# Patient Record
Sex: Male | Born: 1980
Health system: Southern US, Community
[De-identification: ages and names within clinical notes are randomized; demographics above are authoritative.]

## PROBLEM LIST (undated history)

## (undated) DIAGNOSIS — D869 Sarcoidosis, unspecified: Secondary | ICD-10-CM

## (undated) DIAGNOSIS — J45909 Unspecified asthma, uncomplicated: Secondary | ICD-10-CM

## (undated) HISTORY — PX: PATELLA RECONSTRUCTION: SHX736

## (undated) HISTORY — PX: FEMUR FRACTURE SURGERY: SHX633

---

## 2004-12-22 ENCOUNTER — Emergency Department (HOSPITAL_COMMUNITY): Admission: EM | Admit: 2004-12-22 | Discharge: 2004-12-22 | Payer: Self-pay | Admitting: Emergency Medicine

## 2005-05-23 ENCOUNTER — Emergency Department (HOSPITAL_COMMUNITY): Admission: EM | Admit: 2005-05-23 | Discharge: 2005-05-23 | Payer: Self-pay | Admitting: Emergency Medicine

## 2005-08-15 ENCOUNTER — Emergency Department (HOSPITAL_COMMUNITY): Admission: EM | Admit: 2005-08-15 | Discharge: 2005-08-15 | Payer: Self-pay | Admitting: Emergency Medicine

## 2005-11-05 ENCOUNTER — Emergency Department (HOSPITAL_COMMUNITY): Admission: EM | Admit: 2005-11-05 | Discharge: 2005-11-05 | Payer: Self-pay | Admitting: Emergency Medicine

## 2007-02-25 ENCOUNTER — Emergency Department (HOSPITAL_COMMUNITY): Admission: EM | Admit: 2007-02-25 | Discharge: 2007-02-25 | Payer: Self-pay | Admitting: Emergency Medicine

## 2007-03-18 ENCOUNTER — Emergency Department (HOSPITAL_COMMUNITY): Admission: EM | Admit: 2007-03-18 | Discharge: 2007-03-18 | Payer: Self-pay | Admitting: Family Medicine

## 2007-04-03 ENCOUNTER — Emergency Department (HOSPITAL_COMMUNITY): Admission: EM | Admit: 2007-04-03 | Discharge: 2007-04-03 | Payer: Self-pay | Admitting: Family Medicine

## 2007-05-21 ENCOUNTER — Emergency Department (HOSPITAL_COMMUNITY): Admission: EM | Admit: 2007-05-21 | Discharge: 2007-05-22 | Payer: Self-pay | Admitting: Emergency Medicine

## 2007-06-29 ENCOUNTER — Ambulatory Visit (HOSPITAL_COMMUNITY): Admission: EM | Admit: 2007-06-29 | Discharge: 2007-06-29 | Payer: Self-pay | Admitting: Family Medicine

## 2007-07-17 ENCOUNTER — Encounter: Admission: RE | Admit: 2007-07-17 | Discharge: 2007-07-30 | Payer: Self-pay | Admitting: General Surgery

## 2007-07-31 ENCOUNTER — Encounter: Admission: RE | Admit: 2007-07-31 | Discharge: 2007-08-26 | Payer: Self-pay | Admitting: General Surgery

## 2007-08-04 ENCOUNTER — Emergency Department (HOSPITAL_COMMUNITY): Admission: EM | Admit: 2007-08-04 | Discharge: 2007-08-04 | Payer: Self-pay | Admitting: Family Medicine

## 2008-01-03 ENCOUNTER — Emergency Department (HOSPITAL_COMMUNITY): Admission: EM | Admit: 2008-01-03 | Discharge: 2008-01-03 | Payer: Self-pay | Admitting: Family Medicine

## 2008-12-03 ENCOUNTER — Emergency Department (HOSPITAL_COMMUNITY): Admission: EM | Admit: 2008-12-03 | Discharge: 2008-12-03 | Payer: Self-pay | Admitting: Emergency Medicine

## 2008-12-04 ENCOUNTER — Emergency Department (HOSPITAL_COMMUNITY): Admission: EM | Admit: 2008-12-04 | Discharge: 2008-12-04 | Payer: Self-pay | Admitting: Emergency Medicine

## 2008-12-21 ENCOUNTER — Encounter: Payer: Self-pay | Admitting: Pulmonary Disease

## 2008-12-22 ENCOUNTER — Inpatient Hospital Stay (HOSPITAL_COMMUNITY): Admission: AC | Admit: 2008-12-22 | Discharge: 2008-12-29 | Payer: Self-pay

## 2008-12-29 ENCOUNTER — Encounter (INDEPENDENT_AMBULATORY_CARE_PROVIDER_SITE_OTHER): Payer: Self-pay | Admitting: General Surgery

## 2008-12-29 ENCOUNTER — Ambulatory Visit: Payer: Self-pay | Admitting: Vascular Surgery

## 2009-01-06 ENCOUNTER — Encounter (INDEPENDENT_AMBULATORY_CARE_PROVIDER_SITE_OTHER): Payer: Self-pay | Admitting: Orthopedic Surgery

## 2009-01-06 ENCOUNTER — Ambulatory Visit: Admission: RE | Admit: 2009-01-06 | Discharge: 2009-01-06 | Payer: Self-pay | Admitting: Orthopedic Surgery

## 2009-01-06 ENCOUNTER — Ambulatory Visit: Payer: Self-pay | Admitting: Vascular Surgery

## 2009-01-07 ENCOUNTER — Ambulatory Visit: Payer: Self-pay | Admitting: Pulmonary Disease

## 2009-01-07 DIAGNOSIS — R930 Abnormal findings on diagnostic imaging of skull and head, not elsewhere classified: Secondary | ICD-10-CM | POA: Insufficient documentation

## 2009-01-19 ENCOUNTER — Encounter: Admission: RE | Admit: 2009-01-19 | Discharge: 2009-04-16 | Payer: Self-pay | Admitting: Orthopedic Surgery

## 2009-01-22 ENCOUNTER — Ambulatory Visit (HOSPITAL_COMMUNITY): Admission: RE | Admit: 2009-01-22 | Discharge: 2009-01-22 | Payer: Self-pay | Admitting: Orthopedic Surgery

## 2009-01-22 ENCOUNTER — Ambulatory Visit (HOSPITAL_COMMUNITY): Admission: RE | Admit: 2009-01-22 | Discharge: 2009-01-23 | Payer: Self-pay | Admitting: Orthopedic Surgery

## 2009-01-30 ENCOUNTER — Emergency Department (HOSPITAL_COMMUNITY): Admission: EM | Admit: 2009-01-30 | Discharge: 2009-01-30 | Payer: Self-pay | Admitting: Emergency Medicine

## 2009-02-17 ENCOUNTER — Ambulatory Visit: Payer: Self-pay | Admitting: Pulmonary Disease

## 2009-02-24 ENCOUNTER — Telehealth (INDEPENDENT_AMBULATORY_CARE_PROVIDER_SITE_OTHER): Payer: Self-pay | Admitting: *Deleted

## 2009-03-23 ENCOUNTER — Ambulatory Visit: Payer: Self-pay | Admitting: Pulmonary Disease

## 2009-03-23 DIAGNOSIS — J45909 Unspecified asthma, uncomplicated: Secondary | ICD-10-CM | POA: Insufficient documentation

## 2009-03-25 ENCOUNTER — Telehealth: Payer: Self-pay | Admitting: Pulmonary Disease

## 2009-03-31 ENCOUNTER — Ambulatory Visit: Payer: Self-pay | Admitting: Pulmonary Disease

## 2009-03-31 ENCOUNTER — Ambulatory Visit: Admission: RE | Admit: 2009-03-31 | Discharge: 2009-03-31 | Payer: Self-pay | Admitting: Pulmonary Disease

## 2009-04-05 ENCOUNTER — Telehealth (INDEPENDENT_AMBULATORY_CARE_PROVIDER_SITE_OTHER): Payer: Self-pay | Admitting: *Deleted

## 2009-04-06 ENCOUNTER — Ambulatory Visit: Payer: Self-pay | Admitting: Pulmonary Disease

## 2009-04-06 DIAGNOSIS — D869 Sarcoidosis, unspecified: Secondary | ICD-10-CM

## 2009-04-15 ENCOUNTER — Encounter: Payer: Self-pay | Admitting: Pulmonary Disease

## 2009-05-12 ENCOUNTER — Encounter: Admission: RE | Admit: 2009-05-12 | Discharge: 2009-08-03 | Payer: Self-pay | Admitting: Orthopedic Surgery

## 2009-07-11 ENCOUNTER — Inpatient Hospital Stay (HOSPITAL_COMMUNITY): Admission: EM | Admit: 2009-07-11 | Discharge: 2009-07-13 | Payer: Self-pay | Admitting: Emergency Medicine

## 2009-07-12 ENCOUNTER — Encounter: Payer: Self-pay | Admitting: Physician Assistant

## 2009-08-02 ENCOUNTER — Ambulatory Visit: Payer: Self-pay | Admitting: Physician Assistant

## 2009-08-02 DIAGNOSIS — F17200 Nicotine dependence, unspecified, uncomplicated: Secondary | ICD-10-CM | POA: Insufficient documentation

## 2009-08-03 DIAGNOSIS — F1410 Cocaine abuse, uncomplicated: Secondary | ICD-10-CM | POA: Insufficient documentation

## 2009-08-03 LAB — CONVERTED CEMR LAB
Benzodiazepines.: NEGATIVE
Marijuana Metabolite: NEGATIVE
Methadone: NEGATIVE
Phencyclidine (PCP): NEGATIVE
Propoxyphene: NEGATIVE

## 2009-08-09 ENCOUNTER — Ambulatory Visit (HOSPITAL_COMMUNITY): Admission: RE | Admit: 2009-08-09 | Discharge: 2009-08-09 | Payer: Self-pay | Admitting: Physician Assistant

## 2009-08-09 ENCOUNTER — Ambulatory Visit: Payer: Self-pay | Admitting: Physician Assistant

## 2009-08-09 DIAGNOSIS — S82899A Other fracture of unspecified lower leg, initial encounter for closed fracture: Secondary | ICD-10-CM | POA: Insufficient documentation

## 2009-08-09 DIAGNOSIS — S93409A Sprain of unspecified ligament of unspecified ankle, initial encounter: Secondary | ICD-10-CM | POA: Insufficient documentation

## 2009-10-05 ENCOUNTER — Telehealth: Payer: Self-pay | Admitting: Pulmonary Disease

## 2010-03-23 ENCOUNTER — Telehealth (INDEPENDENT_AMBULATORY_CARE_PROVIDER_SITE_OTHER): Payer: Self-pay | Admitting: *Deleted

## 2010-05-17 NOTE — Letter (Signed)
Summary: ORTHO NOTES/ANKLE FX  ORTHO NOTES/ANKLE FX   Imported By: Arta Bruce 08/30/2009 09:53:40  _____________________________________________________________________  External Attachment:    Type:   Image     Comment:   External Document

## 2010-05-17 NOTE — Assessment & Plan Note (Signed)
Summary: 1 WEEK FU TO CHECK BREATHING//KT   Vital Signs:  Patient profile:   30 year old male O2 Sat:      99 % Temp:     98 degrees F Pulse rate:   108 / minute Pulse rhythm:   regular Resp:     20 per minute BP sitting:   132 / 91  (left arm) Cuff size:   regular  Vitals Entered By: Chauncy Passy, SMA CC: Pt. is here for a 1 week f/u on his breathing. -- But pt,comes in w/a right foot injury that happened yesterday while falling down the stairs. Ankle is swollen, painful and tender.  Is Patient Diabetic? No Pain Assessment Patient in pain? yes     Location: right ankle Intensity: 10 Type: Throbbing Onset of pain  Constant  Does patient need assistance? Functional Status Self care Ambulation Impaired:Risk for fall Comments Peak Flow: 390 - 440 - 430   Primary Care Provider:  Tereso Newcomer, PA-C  CC:  Pt. is here for a 1 week f/u on his breathing. -- But pt, comes in w/a right foot injury that happened yesterday while falling down the stairs. Ankle is swollen, and painful and tender. Marland Kitchen  History of Present Illness: Here for f/u on breathing. Never got his prednisone filled.  Now, out of Symbicort. Breathing unchanged. . . obviously.  However, Felice Deem reports falling down > 6 steps last night.  Has old left left injury that caused him to fall.  Feels like his foot dorsiflexed when he was falling.  Cannot bear weight.  Comes in on crutches.  Used ice and elevation last night.  Swelling only got worse.  In a significant amount of pain.  Having a hard time moving his toes.    Current Medications (verified): 1)  Oxycodone Hcl 5 Mg Tabs (Oxycodone Hcl) .... Take 1 To 4 Tabs By Mouth Every 4 Hours As Needed For Pain 2)  Symbicort 160-4.5 Mcg/act  Aero (Budesonide-Formoterol Fumarate) .... Two Puffs Twice Daily 3)  Proair Hfa 108 (90 Base) Mcg/act  Aers (Albuterol Sulfate) .... 2 Puffs Every 4-6 Hours As Needed 4)  Prednisone 10 Mg Tabs (Prednisone) .... Take 6 Tabs  Today, 5 Tab On Tues, 4 Tabs On Wed, 3 Tabs On Thurs, 2 Tabs On Fri, 1 Tab On Sat and Sun and Then Stop 5)  Chantix Starting Month Pak 0.5 Mg X 11 & 1 Mg X 42 Tabs (Varenicline Tartrate) .... Take As Directed 6)  Chantix Continuing Month Pak 1 Mg Tabs (Varenicline Tartrate) .... Take As Directed  Allergies (verified): 1)  ! * Ivp Dye 2)  ! * Shellfish  Physical Exam  General:  alert, well-developed, and well-nourished.   Head:  normocephalic and atraumatic.   Neck:  supple.   Lungs:  decreased breath sounds bilat ins and exp wheezing no rales  Heart:  normal rate and regular rhythm.   Msk:  mod to severe swelling right ankle very tender to palp (patient shouting) with light touch over medial and lateral malleolus and base of 5th metatarsal distal forefoot also tender no obvious deformity but swelling obscures exam Neurologic:  alert & oriented X3 and cranial nerves II-XII intact.     Impression & Recommendations:  Problem # 1:  INTRINSIC ASTHMA, UNSPECIFIED (ICD-493.10) refill meds patient advised to get his meds from [place] pharmacy  His updated medication list for this problem includes:    Symbicort 160-4.5 Mcg/act Aero (Budesonide-formoterol fumarate) .Marland Kitchen..Marland Kitchen Two puffs  twice daily    Proair Hfa 108 (90 Base) Mcg/act Aers (Albuterol sulfate) .Marland Kitchen... 2 puffs every 4-6 hours as needed    Prednisone 10 Mg Tabs (Prednisone) .Marland Kitchen... Take 6 tabs today, 5 tab on tues, 4 tabs on wed, 3 tabs on thurs, 2 tabs on fri, 1 tab on sat and sun and then stop  Problem # 2:  ANKLE SPRAIN, RIGHT (ICD-845.00)  could have fracture send for xrays right now d/w Dr. Reche Dixon . . . compartment syndrome unlikely with foot injury may have some cutaneous nerves irritated if any fx., will contact his orthopedist f/u with me in 48-72 hours no weight bearing on foot until then  The following medications were removed from the medication list:    Oxycodone Hcl 5 Mg Tabs (Oxycodone hcl) .Marland Kitchen... Take 1 to 4 tabs by  mouth every 4 hours as needed for pain His updated medication list for this problem includes:    Vicodin 5-500 Mg Tabs (Hydrocodone-acetaminophen) .Marland Kitchen... 1-2 every 6 hours as needed for pain  Orders: Diagnostic X-Ray/Fluoroscopy (Diagnostic X-Ray/Flu)  Complete Medication List: 1)  Symbicort 160-4.5 Mcg/act Aero (Budesonide-formoterol fumarate) .... Two puffs twice daily 2)  Proair Hfa 108 (90 Base) Mcg/act Aers (Albuterol sulfate) .... 2 puffs every 4-6 hours as needed 3)  Prednisone 10 Mg Tabs (Prednisone) .... Take 6 tabs today, 5 tab on tues, 4 tabs on wed, 3 tabs on thurs, 2 tabs on fri, 1 tab on sat and sun and then stop 4)  Chantix Starting Month Pak 0.5 Mg X 11 & 1 Mg X 42 Tabs (Varenicline tartrate) .... Take as directed 5)  Chantix Continuing Month Pak 1 Mg Tabs (Varenicline tartrate) .... Take as directed 6)  Vicodin 5-500 Mg Tabs (Hydrocodone-acetaminophen) .Marland Kitchen.. 1-2 every 6 hours as needed for pain  Patient Instructions: 1)  Follow up with Shresta Risden in 2-3 days. 2)  No weight bearing on right foot until next appointment. 3)  Apply ice to ankle two times a day to three times a day. 4)  Use ace wrap to comfort. 5)  Go to the emergency room if pain worsens, toes turn blue or white. 6)  Get your medicines filled at the [facility] pharmacy. Prescriptions: VICODIN 5-500 MG TABS (HYDROCODONE-ACETAMINOPHEN) 1-2 every 6 hours as needed for pain  #40 x 0   Entered and Authorized by:   Tereso Newcomer PA-C   Signed by:   Tereso Newcomer PA-C on 08/09/2009   Method used:   Print then Give to Patient   RxID:   [id number] PREDNISONE 10 MG TABS (PREDNISONE) Take 6 tabs today, 5 tab on Tues, 4 tabs on Wed, 3 tabs on Thurs, 2 tabs on Fri, 1 tab on Sat and Sun and then stop  #22 x 0   Entered and Authorized by:   Tereso Newcomer PA-C   Signed by:   Tereso Newcomer PA-C on 08/09/2009   Method used:   Print then Give to Patient   RxID:   [id number] PROAIR HFA 108 (90 BASE) MCG/ACT  AERS  (ALBUTEROL SULFATE) 2 puffs every 4-6 hours as needed  #1 x 6   Entered and Authorized by:   Tereso Newcomer PA-C   Signed by:   Tereso Newcomer PA-C on 08/09/2009   Method used:   Print then Give to Patient   RxID:   [id number] SYMBICORT 160-4.5 MCG/ACT  AERO (BUDESONIDE-FORMOTEROL FUMARATE) Two puffs twice daily  #1 x 6   Entered and Authorized by:   Tereso Newcomer  PA-C   Signed by:   Tereso Newcomer PA-C on 08/09/2009   Method used:   Print then Give to Patient   RxID:   [id number]   Appended Document: 1 WEEK FU TO CHECK BREATHING//KT DG ANKLE COMPLETE*R* - [id number]   Clinical Data: Larey Seat down stairs - pain and swelling and ankle   RIGHT ANKLE - COMPLETE 3+ VIEW   Comparison: None   Findings: There is a fracture involving the medial talus seen best on the oblique view.  There is also a possible avulsion fracture off of the tip of the fibula.  There is soft tissue swelling.   IMPRESSION:   1.  Fracture of the medial aspect of the talus. 2.  Probable avulsion fracture off the tip of the fibula.   Read By:  Bernerd Limbo,  M.D.     Released By:  Bernerd Limbo,  M.D.  _____________________________________________________________________  External Attachment:    Type:     Image     Comment:  Sheryn Bison* - [id number]  Signed by Tereso Newcomer PA-C on 08/09/2009 at 6:05 PM  ________________________________________________________________________ patient referred to his ortho. . . Dr. Carola Frost   Clinical Lists Changes  Problems: Added new problem of FRACTURE, ANKLE, RIGHT (ICD-824.8) Assessed FRACTURE, ANKLE, RIGHT as comment only - has a talar fracture and distal fibular avulsion fracture patient already est with Dr. Carola Frost spoke to patient's wife . . . requesting CT scan spoke to Dr. Magdalene Patricia nurse . . . they will see patient in 2 days. Chijioke Lasser told to remain non weight bearing earlier today also, had him come back to get boot to wear for  immobilization         Impression & Recommendations:  Problem # 1:  FRACTURE, ANKLE, RIGHT (ICD-824.8) has a talar fracture and distal fibular avulsion fracture patient already est with Dr. Carola Frost spoke to patient's wife . . . requesting CT scan spoke to Dr. Magdalene Patricia nurse . . . they will see patient in 2 days. Kessler Solly told to remain non weight bearing earlier today also, had him come back to get boot to wear for immobilization  Complete Medication List: 1)  Symbicort 160-4.5 Mcg/act Aero (Budesonide-formoterol fumarate) .... Two puffs twice daily 2)  Proair Hfa 108 (90 Base) Mcg/act Aers (Albuterol sulfate) .... 2 puffs every 4-6 hours as needed 3)  Prednisone 10 Mg Tabs (Prednisone) .... Take 6 tabs today, 5 tab on tues, 4 tabs on wed, 3 tabs on thurs, 2 tabs on fri, 1 tab on sat and sun and then stop 4)  Chantix Starting Month Pak 0.5 Mg X 11 & 1 Mg X 42 Tabs (Varenicline tartrate) .... Take as directed 5)  Chantix Continuing Month Pak 1 Mg Tabs (Varenicline tartrate) .... Take as directed 6)  Vicodin 5-500 Mg Tabs (Hydrocodone-acetaminophen) .Marland Kitchen.. 1-2 every 6 hours as needed for pain    Signed by Tereso Newcomer PA-C on 08/09/2009 at 6:07 PM

## 2010-05-17 NOTE — Progress Notes (Signed)
Summary: nos appt  Phone Note Call from Patient   Caller: [email or web address]  Call For: Ersel Wadleigh Summary of Call: ATC pt to rsc nos from 6/20, both phone numbers disconnected, no other contact infor available. Initial call taken by: Darletta Moll,  October 05, 2009 9:43 AM

## 2010-05-17 NOTE — Letter (Signed)
Summary: PT INFORMATION SHEET  PT INFORMATION SHEET   Imported By: Arta Bruce 09/29/2009 10:58:14  _____________________________________________________________________  External Attachment:    Type:   Image     Comment:   External Document

## 2010-05-17 NOTE — Progress Notes (Signed)
  All Records faxed to [facility] to [phone] [facility]  March 23, 2010 9:56 AM
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
## 2010-07-10 LAB — DIFFERENTIAL
Lymphocytes Relative: 8 % — ABNORMAL LOW (ref 12–46)
Lymphs Abs: 0.5 10*3/uL — ABNORMAL LOW (ref 0.7–4.0)
Monocytes Absolute: 0.4 10*3/uL (ref 0.1–1.0)
Monocytes Relative: 5 % (ref 3–12)
Neutro Abs: 6.2 10*3/uL (ref 1.7–7.7)
Neutrophils Relative %: 87 % — ABNORMAL HIGH (ref 43–77)

## 2010-07-10 LAB — CBC
HCT: 42.2 % (ref 39.0–52.0)
Hemoglobin: 14.2 g/dL (ref 13.0–17.0)
Hemoglobin: 14.4 g/dL (ref 13.0–17.0)
MCHC: 33.7 g/dL (ref 30.0–36.0)
MCV: 92.1 fL (ref 78.0–100.0)
RBC: 4.63 MIL/uL (ref 4.22–5.81)
RDW: 15 % (ref 11.5–15.5)
WBC: 7.1 10*3/uL (ref 4.0–10.5)

## 2010-07-10 LAB — POCT I-STAT, CHEM 8
BUN: 8 mg/dL (ref 6–23)
Calcium, Ion: 1.06 mmol/L — ABNORMAL LOW (ref 1.12–1.32)
Chloride: 105 mEq/L (ref 96–112)
Creatinine, Ser: 1.2 mg/dL (ref 0.4–1.5)
Glucose, Bld: 114 mg/dL — ABNORMAL HIGH (ref 70–99)
HCT: 45 % (ref 39.0–52.0)
Potassium: 3.7 mEq/L (ref 3.5–5.1)

## 2010-07-10 LAB — COMPREHENSIVE METABOLIC PANEL
Alkaline Phosphatase: 108 U/L (ref 39–117)
BUN: 5 mg/dL — ABNORMAL LOW (ref 6–23)
Creatinine, Ser: 0.99 mg/dL (ref 0.4–1.5)
Glucose, Bld: 186 mg/dL — ABNORMAL HIGH (ref 70–99)
Potassium: 4.2 mEq/L (ref 3.5–5.1)
Total Bilirubin: 0.3 mg/dL (ref 0.3–1.2)
Total Protein: 7.1 g/dL (ref 6.0–8.3)

## 2010-07-10 LAB — PROTIME-INR
INR: 0.95 (ref 0.00–1.49)
Prothrombin Time: 12.6 seconds (ref 11.6–15.2)

## 2010-07-10 LAB — APTT: aPTT: 33 seconds (ref 24–37)

## 2010-07-19 LAB — CULTURE, RESPIRATORY W GRAM STAIN: Culture: NORMAL

## 2010-07-19 LAB — BODY FLUID CELL COUNT WITH DIFFERENTIAL
Lymphs, Fluid: 22 %
Neutrophil Count, Fluid: 65 % — ABNORMAL HIGH (ref 0–25)

## 2010-07-19 LAB — AFB CULTURE WITH SMEAR (NOT AT ARMC)

## 2010-07-21 LAB — CBC
HCT: 34.5 % — ABNORMAL LOW (ref 39.0–52.0)
MCV: 87.2 fL (ref 78.0–100.0)
Platelets: 218 10*3/uL (ref 150–400)
RDW: 14.3 % (ref 11.5–15.5)

## 2010-07-21 LAB — BASIC METABOLIC PANEL
BUN: 16 mg/dL (ref 6–23)
CO2: 25 mEq/L (ref 19–32)
Chloride: 100 mEq/L (ref 96–112)
Glucose, Bld: 96 mg/dL (ref 70–99)
Potassium: 3.9 mEq/L (ref 3.5–5.1)

## 2010-07-22 LAB — CBC
HCT: 26.2 % — ABNORMAL LOW (ref 39.0–52.0)
HCT: 31.4 % — ABNORMAL LOW (ref 39.0–52.0)
HCT: 33.3 % — ABNORMAL LOW (ref 39.0–52.0)
HCT: 39.2 % (ref 39.0–52.0)
HCT: 43.8 % (ref 39.0–52.0)
Hemoglobin: 10.6 g/dL — ABNORMAL LOW (ref 13.0–17.0)
Hemoglobin: 11.2 g/dL — ABNORMAL LOW (ref 13.0–17.0)
Hemoglobin: 13.4 g/dL (ref 13.0–17.0)
Hemoglobin: 8.9 g/dL — ABNORMAL LOW (ref 13.0–17.0)
MCHC: 33.6 g/dL (ref 30.0–36.0)
MCHC: 33.7 g/dL (ref 30.0–36.0)
MCHC: 33.8 g/dL (ref 30.0–36.0)
MCHC: 33.8 g/dL (ref 30.0–36.0)
MCHC: 34.2 g/dL (ref 30.0–36.0)
MCV: 88.8 fL (ref 78.0–100.0)
MCV: 90.2 fL (ref 78.0–100.0)
MCV: 90.4 fL (ref 78.0–100.0)
MCV: 90.4 fL (ref 78.0–100.0)
MCV: 91 fL (ref 78.0–100.0)
Platelets: 150 K/uL (ref 150–400)
Platelets: 161 K/uL (ref 150–400)
Platelets: 161 K/uL (ref 150–400)
Platelets: 176 10*3/uL (ref 150–400)
Platelets: 202 K/uL (ref 150–400)
Platelets: 217 10*3/uL (ref 150–400)
RBC: 2.9 MIL/uL — ABNORMAL LOW (ref 4.22–5.81)
RBC: 3.47 MIL/uL — ABNORMAL LOW (ref 4.22–5.81)
RBC: 3.68 MIL/uL — ABNORMAL LOW (ref 4.22–5.81)
RBC: 4.42 MIL/uL (ref 4.22–5.81)
RDW: 13.8 % (ref 11.5–15.5)
RDW: 13.9 % (ref 11.5–15.5)
RDW: 14 % (ref 11.5–15.5)
RDW: 14.1 % (ref 11.5–15.5)
RDW: 14.3 % (ref 11.5–15.5)
WBC: 13 K/uL — ABNORMAL HIGH (ref 4.0–10.5)
WBC: 7.5 10*3/uL (ref 4.0–10.5)
WBC: 7.5 K/uL (ref 4.0–10.5)
WBC: 9.3 K/uL (ref 4.0–10.5)
WBC: 9.5 K/uL (ref 4.0–10.5)

## 2010-07-22 LAB — POCT I-STAT 4, (NA,K, GLUC, HGB,HCT)
HCT: 47 % (ref 39.0–52.0)
Hemoglobin: 16 g/dL (ref 13.0–17.0)
Potassium: 4.9 meq/L (ref 3.5–5.1)
Sodium: 135 meq/L (ref 135–145)

## 2010-07-22 LAB — BASIC METABOLIC PANEL WITH GFR
BUN: 1 mg/dL — ABNORMAL LOW (ref 6–23)
BUN: 11 mg/dL (ref 6–23)
BUN: 4 mg/dL — ABNORMAL LOW (ref 6–23)
BUN: 5 mg/dL — ABNORMAL LOW (ref 6–23)
CO2: 22 meq/L (ref 19–32)
CO2: 30 meq/L (ref 19–32)
CO2: 31 meq/L (ref 19–32)
CO2: 33 meq/L — ABNORMAL HIGH (ref 19–32)
Calcium: 7.8 mg/dL — ABNORMAL LOW (ref 8.4–10.5)
Calcium: 8.1 mg/dL — ABNORMAL LOW (ref 8.4–10.5)
Calcium: 8.1 mg/dL — ABNORMAL LOW (ref 8.4–10.5)
Calcium: 8.5 mg/dL (ref 8.4–10.5)
Chloride: 102 meq/L (ref 96–112)
Chloride: 102 meq/L (ref 96–112)
Chloride: 96 meq/L (ref 96–112)
Chloride: 99 meq/L (ref 96–112)
Creatinine, Ser: 1 mg/dL (ref 0.4–1.5)
Creatinine, Ser: 1.01 mg/dL (ref 0.4–1.5)
Creatinine, Ser: 1.13 mg/dL (ref 0.4–1.5)
Creatinine, Ser: 1.2 mg/dL (ref 0.4–1.5)
GFR calc Af Amer: >60 mL/min (ref >60)
GFR calc Af Amer: >60 mL/min (ref >60)
GFR calc Af Amer: >60 mL/min (ref >60)
GFR calc Af Amer: >60 mL/min (ref >60)
GFR calc non Af Amer: >60 mL/min (ref >60)
GFR calc non Af Amer: >60 mL/min (ref >60)
GFR calc non Af Amer: >60 mL/min (ref >60)
GFR calc non Af Amer: >60 mL/min (ref >60)
Glucose, Bld: 115 mg/dL — ABNORMAL HIGH (ref 70–99)
Glucose, Bld: 122 mg/dL — ABNORMAL HIGH (ref 70–99)
Glucose, Bld: 127 mg/dL — ABNORMAL HIGH (ref 70–99)
Glucose, Bld: 138 mg/dL — ABNORMAL HIGH (ref 70–99)
Potassium: 3.5 meq/L (ref 3.5–5.1)
Potassium: 3.7 meq/L (ref 3.5–5.1)
Potassium: 3.9 meq/L (ref 3.5–5.1)
Potassium: 4.5 meq/L (ref 3.5–5.1)
Sodium: 133 meq/L — ABNORMAL LOW (ref 135–145)
Sodium: 135 meq/L (ref 135–145)
Sodium: 136 meq/L (ref 135–145)
Sodium: 139 meq/L (ref 135–145)

## 2010-07-22 LAB — PROTIME-INR: INR: 0.9 (ref 0.00–1.49)

## 2010-07-22 LAB — TYPE AND SCREEN: ABO/RH(D): O POS

## 2010-07-22 LAB — POCT I-STAT, CHEM 8
BUN: 10 mg/dL (ref 6–23)
Calcium, Ion: 0.95 mmol/L — ABNORMAL LOW (ref 1.12–1.32)
HCT: 52 % (ref 39.0–52.0)
TCO2: 19 mmol/L (ref 0–100)

## 2010-07-22 LAB — URINALYSIS, MICROSCOPIC ONLY
Bilirubin Urine: NEGATIVE
Glucose, UA: NEGATIVE mg/dL
Hgb urine dipstick: NEGATIVE
Ketones, ur: NEGATIVE mg/dL
Leukocytes, UA: NEGATIVE
Nitrite: NEGATIVE
Protein, ur: NEGATIVE mg/dL
Specific Gravity, Urine: 1.011 (ref 1.005–1.030)
Urobilinogen, UA: 0.2 mg/dL (ref 0.0–1.0)
pH: 6 (ref 5.0–8.0)

## 2010-07-22 LAB — BASIC METABOLIC PANEL
BUN: 3 mg/dL — ABNORMAL LOW (ref 6–23)
Chloride: 100 mEq/L (ref 96–112)
GFR calc non Af Amer: >60 mL/min (ref >60)
Glucose, Bld: 116 mg/dL — ABNORMAL HIGH (ref 70–99)
Potassium: 3.8 mEq/L (ref 3.5–5.1)
Sodium: 137 mEq/L (ref 135–145)

## 2010-07-23 LAB — GC/CHLAMYDIA PROBE AMP, GENITAL
Chlamydia, DNA Probe: NEGATIVE
GC Probe Amp, Genital: NEGATIVE

## 2010-07-23 LAB — URINALYSIS, ROUTINE W REFLEX MICROSCOPIC
Hgb urine dipstick: NEGATIVE
Nitrite: NEGATIVE
Protein, ur: NEGATIVE mg/dL
Urobilinogen, UA: 0.2 mg/dL (ref 0.0–1.0)

## 2010-07-23 LAB — CBC
MCHC: 34.1 g/dL (ref 30.0–36.0)
RBC: 5.08 MIL/uL (ref 4.22–5.81)
RDW: 13.3 % (ref 11.5–15.5)

## 2010-07-23 LAB — DIFFERENTIAL
Basophils Absolute: 0 10*3/uL (ref 0.0–0.1)
Basophils Relative: 0 % (ref 0–1)
Neutro Abs: 6.7 10*3/uL (ref 1.7–7.7)
Neutrophils Relative %: 79 % — ABNORMAL HIGH (ref 43–77)

## 2010-07-23 LAB — BASIC METABOLIC PANEL
CO2: 24 mEq/L (ref 19–32)
Calcium: 8.8 mg/dL (ref 8.4–10.5)
Creatinine, Ser: 1.06 mg/dL (ref 0.4–1.5)
GFR calc Af Amer: >60 mL/min (ref >60)
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
## 2011-01-09 LAB — CBC
MCHC: 34.2
MCV: 91.8
Platelets: 229
RBC: 4.85
WBC: 9.5

## 2011-01-09 LAB — DIFFERENTIAL
Basophils Relative: 1
Eosinophils Absolute: 0.2
Monocytes Relative: 5
Neutro Abs: 7.4
Neutrophils Relative %: 79 — ABNORMAL HIGH

## 2011-01-16 LAB — GC/CHLAMYDIA PROBE AMP, GENITAL
Chlamydia, DNA Probe: NEGATIVE
GC Probe Amp, Genital: NEGATIVE

## 2014-03-16 ENCOUNTER — Encounter (HOSPITAL_COMMUNITY): Payer: Self-pay | Admitting: *Deleted

## 2014-03-16 ENCOUNTER — Emergency Department (HOSPITAL_COMMUNITY)
Admission: EM | Admit: 2014-03-16 | Discharge: 2014-03-16 | Disposition: A | Discharge status: Home / Self Care | Payer: Self-pay | Attending: Emergency Medicine | Admitting: Emergency Medicine

## 2014-03-16 ENCOUNTER — Emergency Department (HOSPITAL_COMMUNITY): Payer: Self-pay

## 2014-03-16 DIAGNOSIS — Z862 Personal history of diseases of the blood and blood-forming organs and certain disorders involving the immune mechanism: Secondary | ICD-10-CM | POA: Insufficient documentation

## 2014-03-16 DIAGNOSIS — Y9367 Activity, basketball: Secondary | ICD-10-CM | POA: Insufficient documentation

## 2014-03-16 DIAGNOSIS — W19XXXA Unspecified fall, initial encounter: Secondary | ICD-10-CM

## 2014-03-16 DIAGNOSIS — S8391XA Sprain of unspecified site of right knee, initial encounter: Secondary | ICD-10-CM

## 2014-03-16 DIAGNOSIS — S8390XA Sprain of unspecified site of unspecified knee, initial encounter: Secondary | ICD-10-CM | POA: Insufficient documentation

## 2014-03-16 DIAGNOSIS — X58XXXA Exposure to other specified factors, initial encounter: Secondary | ICD-10-CM | POA: Insufficient documentation

## 2014-03-16 DIAGNOSIS — Y998 Other external cause status: Secondary | ICD-10-CM | POA: Insufficient documentation

## 2014-03-16 DIAGNOSIS — Y92838 Other recreation area as the place of occurrence of the external cause: Secondary | ICD-10-CM | POA: Insufficient documentation

## 2014-03-16 DIAGNOSIS — J45909 Unspecified asthma, uncomplicated: Secondary | ICD-10-CM | POA: Insufficient documentation

## 2014-03-16 HISTORY — DX: Unspecified asthma, uncomplicated: J45.909

## 2014-03-16 HISTORY — DX: Sarcoidosis, unspecified: D86.9

## 2014-03-16 MED ORDER — METHOCARBAMOL 500 MG PO TABS: 500.0000 mg | ORAL_TABLET | Freq: Two times a day (BID) | ORAL | Status: DC

## 2014-03-16 MED ORDER — IBUPROFEN 800 MG PO TABS: 800.0000 mg | ORAL_TABLET | Freq: Three times a day (TID) | ORAL | Status: DC

## 2014-03-16 NOTE — Discharge Instructions (Signed)

## 2014-03-16 NOTE — ED Notes (Signed)
Pt in c/o right knee pain, states he was playing basketball and is concerned he tore something, ambulatory with increased pain, denies swelling in knee

## 2014-03-16 NOTE — ED Provider Notes (Signed)
CSN: [id number]     Arrival date & time 03/16/14  1614 History  This chart was scribed for non-physician practitioner, Fayrene HelperBowie Teighan Aubert, PA-C working with Geoffery Lyonsouglas Delo, MD by Greggory StallionKayla Andersen, ED scribe. This patient was seen in room TR07C/TR07C and the patient's care was started at 5:33 PM.   Chief Complaint  Patient presents with  . Knee Injury   The history is provided by the patient. No language interpreter was used.    HPI Comments: Shawn Raymond is a 33 y.o. male who presents to the Emergency Department complaining of right knee injury that occurred yesterday. States he was playing basketball, tried to do a layup and landed wrong. Reports sudden onset right knee pain. Reports mild swelling in his foot. Bearing weight and movements worsen the pain. He has used an ACE bandage and taken aleve with no relief. Rest relives some pain. Denies numbness or tingling. Denies past injury or problems with his right knee.   Past Medical History  Diagnosis Date  . Asthma   . Sarcoidosis    History reviewed. No pertinent past surgical history. History reviewed. No pertinent family history. History  Substance Use Topics  . Smoking status: Never Smoker   . Smokeless tobacco: Not on file  . Alcohol Use: Not on file    Review of Systems  Musculoskeletal: Positive for joint swelling and arthralgias.  Neurological: Negative for numbness.  All other systems reviewed and are negative.  Allergies  Shellfish allergy  Home Medications   Prior to Admission medications   Not on File   BP 108/86 mmHg  Pulse 83  Temp(Src) 98.3 F (36.8 C) (Oral)  Resp 20  Ht 5\' 6"  (1.676 m)  Wt 195 lb (88.451 kg)  BMI 31.49 kg/m2  SpO2 100%   Physical Exam  Constitutional: He is oriented to person, place, and time. He appears well-developed and well-nourished. No distress.  HENT:  Head: Normocephalic and atraumatic.  Eyes: Conjunctivae and EOM are normal.  Neck: Neck supple. No tracheal deviation present.   Cardiovascular: Normal rate.   Pulmonary/Chest: Effort normal. No respiratory distress.  Musculoskeletal:  Tenderness to palpation to right inferior patella region. No obvious deformity. No significant effusion noted. Negative anterior and posterior drawer test. No significant pain with varus and valgus maneuver. No joint laxity noted. Right ankle and right hip are non tender to palpation.   Neurological: He is alert and oriented to person, place, and time.  Skin: Skin is warm and dry.  Psychiatric: He has a normal mood and affect. His behavior is normal.  Nursing note and vitals reviewed.   ED Course  Procedures (including critical care time)  DIAGNOSTIC STUDIES: Oxygen Saturation is 100% on RA, normal by my interpretation.    COORDINATION OF CARE: 5:37 PM-Discussed treatment plan which includes ibuprofen, ice, and elevation with pt at bedside and pt agreed to plan. Will give pt an orthopedic referral and advised him to follow up if symptoms do not start resolving.   Labs Review Labs Reviewed - No data to display  Imaging Review Dg Knee Complete 4 Views Right  03/16/2014   CLINICAL DATA:  Basketball injury.  EXAM: RIGHT KNEE - COMPLETE 4+ VIEW  COMPARISON:  None.  FINDINGS: Corticated bony densities noted adjacent to the medial femoral condyles consistent with old medial collateral ligamentous injury. No acute abnormality identified.  IMPRESSION: No acute bony abnormality. Old medial collateral ligamentous injury.   Electronically Signed   By: Maisie Fushomas  Register   On:  03/16/2014 17:16     EKG Interpretation None      MDM   Final diagnoses:  Right knee sprain, initial encounter    BP 108/86 mmHg  Pulse 83  Temp(Src) 98.3 F (36.8 C) (Oral)  Resp 20  Ht 5\' 6"  (1.676 m)  Wt 195 lb (88.451 kg)  BMI 31.49 kg/m2  SpO2 100%  I have reviewed nursing notes and vital signs. I personally reviewed the imaging tests through PACS system  I reviewed available ER/hospitalization  records thought the EMR   I personally performed the services described in this documentation, which was scribed in my presence. The recorded information has been reviewed and is accurate.  Fayrene HelperBowie Mansel Strother, PA-C 03/16/14 1807  Geoffery Lyonsouglas Delo, MD 03/17/14 [phone]

## 2014-06-26 ENCOUNTER — Emergency Department (HOSPITAL_COMMUNITY): Payer: Self-pay

## 2014-06-26 ENCOUNTER — Emergency Department (HOSPITAL_COMMUNITY)
Admission: EM | Admit: 2014-06-26 | Discharge: 2014-06-26 | Disposition: A | Discharge status: Home / Self Care | Payer: Self-pay | Attending: Emergency Medicine | Admitting: Emergency Medicine

## 2014-06-26 DIAGNOSIS — J45901 Unspecified asthma with (acute) exacerbation: Secondary | ICD-10-CM | POA: Insufficient documentation

## 2014-06-26 MED ORDER — IPRATROPIUM-ALBUTEROL 0.5-2.5 (3) MG/3ML IN SOLN
3.0000 mL | Freq: Once | RESPIRATORY_TRACT | Status: AC
  Administered 2014-06-26: 3 mL via RESPIRATORY_TRACT
  Filled 2014-06-26: qty 3

## 2014-06-26 MED ORDER — IPRATROPIUM-ALBUTEROL 0.5-2.5 (3) MG/3ML IN SOLN
3.0000 mL | Freq: Once | RESPIRATORY_TRACT | Status: AC
  Administered 2014-06-26: 3 mL via RESPIRATORY_TRACT

## 2014-06-26 MED ORDER — IPRATROPIUM-ALBUTEROL 0.5-2.5 (3) MG/3ML IN SOLN
RESPIRATORY_TRACT | Status: AC
  Filled 2014-06-26: qty 3

## 2014-06-26 NOTE — ED Notes (Signed)
Pt reports shortness of breath x 2 days.  Sts he ran out of his albuterol inhaler this morning.  Expiratory wheezing in triage.

## 2014-06-26 NOTE — ED Notes (Signed)
No answer x3

## 2014-12-01 ENCOUNTER — Emergency Department (HOSPITAL_COMMUNITY)
Admission: EM | Admit: 2014-12-01 | Discharge: 2014-12-01 | Disposition: A | Discharge status: Home / Self Care | Payer: Self-pay | Attending: Emergency Medicine | Admitting: Emergency Medicine

## 2014-12-01 ENCOUNTER — Emergency Department (HOSPITAL_COMMUNITY): Payer: Self-pay

## 2014-12-01 ENCOUNTER — Encounter (HOSPITAL_COMMUNITY): Payer: Self-pay | Admitting: Neurology

## 2014-12-01 DIAGNOSIS — Z79899 Other long term (current) drug therapy: Secondary | ICD-10-CM | POA: Insufficient documentation

## 2014-12-01 DIAGNOSIS — Z720 Tobacco use: Secondary | ICD-10-CM | POA: Insufficient documentation

## 2014-12-01 DIAGNOSIS — S79912A Unspecified injury of left hip, initial encounter: Secondary | ICD-10-CM | POA: Insufficient documentation

## 2014-12-01 DIAGNOSIS — Y999 Unspecified external cause status: Secondary | ICD-10-CM | POA: Insufficient documentation

## 2014-12-01 DIAGNOSIS — J45909 Unspecified asthma, uncomplicated: Secondary | ICD-10-CM | POA: Insufficient documentation

## 2014-12-01 DIAGNOSIS — Y929 Unspecified place or not applicable: Secondary | ICD-10-CM | POA: Insufficient documentation

## 2014-12-01 DIAGNOSIS — W108XXA Fall (on) (from) other stairs and steps, initial encounter: Secondary | ICD-10-CM | POA: Insufficient documentation

## 2014-12-01 DIAGNOSIS — Z862 Personal history of diseases of the blood and blood-forming organs and certain disorders involving the immune mechanism: Secondary | ICD-10-CM | POA: Insufficient documentation

## 2014-12-01 DIAGNOSIS — Y939 Activity, unspecified: Secondary | ICD-10-CM | POA: Insufficient documentation

## 2014-12-01 DIAGNOSIS — S8992XA Unspecified injury of left lower leg, initial encounter: Secondary | ICD-10-CM | POA: Insufficient documentation

## 2014-12-01 DIAGNOSIS — W19XXXA Unspecified fall, initial encounter: Secondary | ICD-10-CM

## 2014-12-01 DIAGNOSIS — Z791 Long term (current) use of non-steroidal anti-inflammatories (NSAID): Secondary | ICD-10-CM | POA: Insufficient documentation

## 2014-12-01 DIAGNOSIS — S79922A Unspecified injury of left thigh, initial encounter: Secondary | ICD-10-CM | POA: Insufficient documentation

## 2014-12-01 MED ORDER — IBUPROFEN 800 MG PO TABS: 800.0000 mg | ORAL_TABLET | Freq: Three times a day (TID) | ORAL | Status: DC

## 2014-12-01 MED ORDER — METHOCARBAMOL 500 MG PO TABS: 500.0000 mg | ORAL_TABLET | Freq: Two times a day (BID) | ORAL | Status: DC

## 2014-12-01 NOTE — ED Notes (Signed)
Patient transported to X-ray 

## 2014-12-01 NOTE — ED Notes (Addendum)
Pt dc'd home. All discharge instructions reviewed with pt. All questions answered. NAD

## 2014-12-01 NOTE — ED Notes (Signed)
Pt reports 6 years ago had surgery and had rods and bolts placed in his left hip, upper leg and knee. Has been having pain to areas for 3 days since falling down 3 steps. Pt is ambulatory.

## 2014-12-01 NOTE — ED Provider Notes (Signed)
CSN: [id number]     Arrival date & time 12/01/14  1215 History  This chart was scribed for Earley Favor, NP working with Fransico Setters, MD by Placido Sou, ED Scribe. This patient was seen in room TR11C/TR11C and the patient's care was started at 2:01 PM.    Chief Complaint  Patient presents with  . Hip Pain  . Leg Pain   The history is provided by the patient. No language interpreter was used.    HPI Comments: Shawn Raymond is a 34 y.o. male who presents to the Emergency Department complaining of a fall that occurred 4 days ago. Pt notes that he fell down 4 steps landing on the floor below on this knees and further notes an associated exacerbation of chronic pain to his left hip, thigh and knee which has not subsided. He notes taking multiple OTC medications every 2-3 hours for pain management which provided no relief. Pt notes a shx which resulted in a metal rod that spans his left thigh. He notes his surgery was performed by Dr. Myrene Galas. Pt denies any current medical coverage. Pt denies any known drug allergies.  Past Medical History  Diagnosis Date  . Asthma   . Sarcoidosis    History reviewed. No pertinent past surgical history. No family history on file. Social History  Substance Use Topics  . Smoking status: Current Every Day Smoker  . Smokeless tobacco: None  . Alcohol Use: No    Review of Systems  Musculoskeletal: Positive for myalgias and arthralgias. Negative for joint swelling.  Skin: Negative for wound.  All other systems reviewed and are negative.   Allergies  Shellfish allergy  Home Medications   Prior to Admission medications   Medication Sig Start Date End Date Taking? Authorizing Provider  ibuprofen (ADVIL,MOTRIN) 800 MG tablet Take 1 tablet (800 mg total) by mouth 3 (three) times daily. 03/16/14   Fayrene Helper, PA-C  methocarbamol (ROBAXIN) 500 MG tablet Take 1 tablet (500 mg total) by mouth 2 (two) times daily. 03/16/14   Fayrene Helper, PA-C   BP  113/91 mmHg  Pulse 77  Temp(Src) 98.1 F (36.7 C) (Oral)  Resp 15  SpO2 99% Physical Exam  Constitutional: He is oriented to person, place, and time. He appears well-developed and well-nourished. No distress.  HENT:  Head: Normocephalic and atraumatic.  Eyes: Conjunctivae and EOM are normal. Pupils are equal, round, and reactive to light.  Neck: Normal range of motion. Neck supple. No tracheal deviation present.  Cardiovascular: Normal rate.   Pulmonary/Chest: Effort normal. No respiratory distress.  Abdominal: Soft.  Musculoskeletal: Normal range of motion. He exhibits tenderness.  Posterior and anterior left knee pain with FROM; well healed surgical scars  Neurological: He is alert and oriented to person, place, and time.  Skin: Skin is warm and dry.  Psychiatric: He has a normal mood and affect. His behavior is normal.  Nursing note and vitals reviewed.   ED Course  Procedures  DIAGNOSTIC STUDIES: Oxygen Saturation is 99% on RA, normal by my interpretation.    COORDINATION OF CARE: 2:05 PM Discussed treatment plan with pt at bedside and pt agreed to plan.  Labs Review Labs Reviewed - No data to display  Imaging Review No results found. I have personally reviewed and evaluated these images and lab results as part of my medical decision-making.   EKG Interpretation None     x-rays have been reviewed.  There is no fracture within the knee or hip.  The surgical appliances are intact.  He'll be treated for pain and muscle spasm and follow-up with a primary care physician  MDM   Final diagnoses:  None   I personally performed the services described in this documentation, which was scribed in my presence. The recorded information has been reviewed and is accurate.   Earley Favor, NP 12/01/14 1516  Melene Plan, DO 12/01/14 1550

## 2014-12-01 NOTE — Discharge Instructions (Signed)
Fall Prevention and Home Safety Falls cause injuries and can affect all age groups. It is possible to use preventive measures to significantly decrease the likelihood of falls. There are many simple measures which can make your home safer and prevent falls. OUTDOORS  Repair cracks and edges of walkways and driveways.  Remove high doorway thresholds.  Trim shrubbery on the main path into your home.  Have good outside lighting.  Clear walkways of tools, rocks, debris, and clutter.  Check that handrails are not broken and are securely fastened. Both sides of steps should have handrails.  Have leaves, snow, and ice cleared regularly.  Use sand or salt on walkways during winter months.  In the garage, clean up grease or oil spills. BATHROOM  Install night lights.  Install grab bars by the toilet and in the tub and shower.  Use non-skid mats or decals in the tub or shower.  Place a plastic non-slip stool in the shower to sit on, if needed.  Keep floors dry and clean up all water on the floor immediately.  Remove soap buildup in the tub or shower on a regular basis.  Secure bath mats with non-slip, double-sided rug tape.  Remove throw rugs and tripping hazards from the floors. BEDROOMS  Install night lights.  Make sure a bedside light is easy to reach.  Do not use oversized bedding.  Keep a telephone by your bedside.  Have a firm chair with side arms to use for getting dressed.  Remove throw rugs and tripping hazards from the floor. KITCHEN  Keep handles on pots and pans turned toward the center of the stove. Use back burners when possible.  Clean up spills quickly and allow time for drying.  Avoid walking on wet floors.  Avoid hot utensils and knives.  Position shelves so they are not too high or low.  Place commonly used objects within easy reach.  If necessary, use a sturdy step stool with a grab bar when reaching.  Keep electrical cables out of the  way.  Do not use floor polish or wax that makes floors slippery. If you must use wax, use non-skid floor wax.  Remove throw rugs and tripping hazards from the floor. STAIRWAYS  Never leave objects on stairs.  Place handrails on both sides of stairways and use them. Fix any loose handrails. Make sure handrails on both sides of the stairways are as long as the stairs.  Check carpeting to make sure it is firmly attached along stairs. Make repairs to worn or loose carpet promptly.  Avoid placing throw rugs at the top or bottom of stairways, or properly secure the rug with carpet tape to prevent slippage. Get rid of throw rugs, if possible.  Have an electrician put in a light switch at the top and bottom of the stairs. OTHER FALL PREVENTION TIPS  Wear low-heel or rubber-soled shoes that are supportive and fit well. Wear closed toe shoes.  When using a stepladder, make sure it is fully opened and both spreaders are firmly locked. Do not climb a closed stepladder.  Add color or contrast paint or tape to grab bars and handrails in your home. Place contrasting color strips on first and last steps.  Learn and use mobility aids as needed. Install an electrical emergency response system.  Turn on lights to avoid dark areas. Replace light bulbs that burn out immediately. Get light switches that glow.  Arrange furniture to create clear pathways. Keep furniture in the same place.  Firmly attach carpet with non-skid or double-sided tape.  Eliminate uneven floor surfaces.  Select a carpet pattern that does not visually hide the edge of steps.  Be aware of all pets. OTHER HOME SAFETY TIPS  Set the water temperature for 120 F (48.8 C).  Keep emergency numbers on or near the telephone.  Keep smoke detectors on every level of the home and near sleeping areas. Document Released: 03/24/2002 Document Revised: 10/03/2011 Document Reviewed: 06/23/2011 [facility] Patient Information 2015  [place], [place]. This information is not intended to replace advice given to you by your health care provider. Make sure you discuss any questions you have with your health care provider.  Emergency Department Resource Guide 1) Find a Doctor and Pay Out of Pocket Although you won't have to find out who is covered by your insurance plan, it is a good idea to ask around and get recommendations. You will then need to call the office and see if the doctor you have chosen will accept you as a new patient and what types of options they offer for patients who are self-pay. Some doctors offer discounts or will set up payment plans for their patients who do not have insurance, but you will need to ask so you aren't surprised when you get to your appointment.  2) Contact Your Local Health Department Not all health departments have doctors that can see patients for sick visits, but many do, so it is worth a call to see if yours does. If you don't know where your local health department is, you can check in your phone book. The CDC also has a tool to help you locate your state's health department, and many state websites also have listings of all of their local health departments.  3) Find a Walk-in Clinic If your illness is not likely to be very severe or complicated, you may want to try a walk in clinic. These are popping up all over the country in pharmacies, drugstores, and shopping centers. They're usually staffed by nurse practitioners or physician assistants that have been trained to treat common illnesses and complaints. They're usually fairly quick and inexpensive. However, if you have serious medical issues or chronic medical problems, these are probably not your best option.  No Primary Care Doctor: - Call Health Connect at  [phone] - they can help you locate a primary care doctor that  accepts your insurance, provides certain services, etc. - Physician Referral Service- [phone]  Chronic Pain  Problems: Organization         Address  Phone   Notes  [facility]  [phone] Patients need to be referred by their primary care doctor.   Medication Assistance: Organization         Address  Phone   Notes  [facility] Medication [facility] [address] [place], [place] [address] [phone] --Must be a resident of [facility] -- Must have NO insurance coverage whatsoever (no Medicaid/ Medicare, etc.) -- The pt. MUST have a primary care doctor that directs their care regularly and follows them in the community   [facility]ist  [phone]   [employer]  [phone]    Agencies that provide inexpensive medical care: Organization         Address  Phone   Notes  [facility]  [phone]   [facility]    [phone]   [facility] [address] [place], [place] [address] [phone]   [facility] [address] [place] [place] [phone]   Planned Parenthood    [phone]   [facility]    [phone]   [facility]  [address], [place] Phone:  [phone], Fax:  [phone] Hours of Operation:  9 am - 6 pm, M-F.  Also accepts Medicaid/Medicare and self-pay.  [facility]  [address], [place] Phone: [phone], Fax: [phone]. Hours of Operation:  8:30 am - 5:30 pm, M-F.  Also accepts Medicaid and self-pay.  [facility] [place] [address], [place] Phone: [phone]   [facility] [address] Natasha Bence [place], [place] [phone], Ext. [phone] Mondays & Thursdays: 7-9 AM.  First 15 patients are seen on a first come, first serve basis.    Medicaid-accepting [facility] Providers:  Organization         Address  Phone   Notes  [facility] [address], [place] [phone] Also  accepts self-pay patients.  [facility] [address] Laurell Josephs [place], [place]  [phone]   [facility] [address], [place] [phone]   [facility] [address] [place] [phone]   Renaye Rakers [address], [place]   [phone] Only accepts [place] Access [place] patients after they have their name applied to their card.   Self-Pay (no insurance) in [facility]:  Organization         Address  Phone   Notes  Sickle Cell Patients, [facility] [address] [place], [place] [phone]   [facility]gent Care [address] [place], [place] [phone]   [facility] [place]  [address], [place] [phone]   [facility]/Dr. Osei-Bonsu  [address] [place] or [address], [place] [phone] Phone number for both [place] and [place] locations is the same.  [facility] [address], [place] [phone]   [facility] [address], [place] or [address] [phone]   [facility] [address] [place] [phone], phone; [phone], fax Sees patients 1st and 3rd Saturday of every month.  Must not qualify for public or private insurance (i.e. Medicaid, Medicare, [place] Health Choice, Veterans' Benefits)  Household income should be no more than 200% of the poverty level The clinic cannot treat you if you are pregnant or think you are pregnant  Sexually transmitted diseases are not treated at the clinic.    Dental Care: Organization         Address  Phone  Notes  [facility] Department of [facility] [address] [place], [place] [phone] Accepts children up to age 37 who are enrolled in [place] or [place] Health Choice; pregnant  women with a Medicaid card; and children who have applied for Medicaid or [place] Health Choice, but were declined, whose parents can pay a reduced fee at time of service.  [facility] of [facility]  [address], [place] [phone] Accepts children up to age 6 who are enrolled in [place] or  Health  Choice; pregnant women with a Medicaid card; and children who have applied for Medicaid or [place] Health Choice, but were declined, whose parents can pay a reduced fee at time of service.  [facility]  [address] [place], [place] [phone] Patients are seen by appointment only. Walk-ins are not accepted. [facility] will see patients 23 years of age and older. Monday - Tuesday (8am-5pm) Most Wednesdays (8:30-5pm) $30 per visit, cash only  [facility]  [address], [facility] [phone] Patients are seen by appointment only. Walk-ins are not accepted. [facility] will see patients 49 years of age and older. One Wednesday Evening (Monthly: Volunteer Based).  $30 per visit, cash only  [employer]  [phone] for adults; Children under age 25, call Graduate Pediatric Dentistry at [phone]. Children aged 61-14, please call [phone] to request a pediatric application.  Dental services are provided in all areas of dental care including fillings, crowns and bridges, complete and partial dentures, implants, gum treatment, root canals, and extractions. Preventive care is also provided. Treatment is provided to both adults and children. Patients are selected via a lottery and there is often a waiting list.   [facility] [name] [place]  [phone] [email or web address]   [facility] [address] [place], [place] [phone], Ext. [phone] Second and Fourth Thursday of each month, opens at 6:30 AM; Clinic ends at 9 AM.  Patients are  seen on a first-come first-served basis, and a limited number are seen during each clinic.   [facility]  [address] Ether Griffins [place], [place] [phone]   Eligibility Requirements You must have lived in [place], [place], or [place] counties for at least the last three months.   You cannot be eligible for state or federal sponsored [place], including [employer], [place], or [employer].   You generally cannot be eligible for healthcare insurance through your employer.    How to apply: Eligibility screenings are held every Tuesday and Wednesday afternoon from 1:00 pm until 4:00 pm. You do not need an appointment for the interview!  [facility] [address], [place], [place] [phone]   [facility] Health Department  [phone]   [facility] Health Department  [phone]   [facility] Health Department  [phone]    Behavioral Health Resources in the Community: Intensive Outpatient Programs Organization         Address  Phone  Notes  [facility] [address] [place], [place] [phone]   [facility] [address] [place], [place] [phone]   [facility] [address], [place], [place]  [phone]   [facility] Mental Health [address],  [place], [place] [phone] or [phone]   Substance Abuse Resources Organization         Address  Phone  Notes  [facility]  [phone]   [facility]  [phone]   [facility] [place]  [phone]   Floydene Flock  [phone]   Residential & Outpatient Substance Abuse Program  [phone]   Psychological Services Organization         Address  Phone  Notes  [facility] Health  [phone]   [facility]  [phone]   [facility] Mental Health [address], [place] [phone] or [phone]    Mobile  Crisis  Teams Organization         Address  Phone  Notes  [facility], Mobile Crisis Care Unit  [phone]   [facility]  [address] [place], [place] [phone]   [facility] [address] [place] [phone]    Self-Help/Support Groups Organization         Address  Phone             Notes  [facility] [place] - variety of support groups  336- I7437963 Call for more information  Narcotics Anonymous (NA), [facility] [address], [employer] [place]  2 meetings at this location   Statistician         Address  Phone  Notes  [facility] [address],    [place]  [phone]   [facility]  [address], [place] [address], [place], [place] [phone]   [facility] [address] [place], [place] [phone] Admissions: 8am-3pm M-F  [facility] [address]    [place], [place] [phone]   [facility] [address] [place], [place], [place] [phone]   [facility] [address]  [place], [place] [phone]   [facility] - Intensive Outpatient [address] Laurell Josephs [address], [place], [place] [phone]   [facility] ([facility] [address] [place]  [place], [place] [phone] or [phone]   [facility] ([facility]) [address] [place], [place] [phone] Accepts Medicaid  [facility] [address]  [place] [phone] Substance Abuse/Addiction Treatment   [facility] Organization         Address  Phone  Notes  [facility]  [phone]   Angie Fava, PhD [address] Ervin Knack [place], [place]   [phone] or [phone]   [facility] Behavioral   [address] [place] [place], [place] [phone]   [facility] [address] [place], [place] [phone]  Insurance/Medicaid/sponsorship through [facility] and Families [address]                                    [place], [place] [phone] Therapy/tele-psych/case  [facility] [address], [place] [phone]    Dr. Lolly Mustache  [phone]   [facility]  [employer] [facility] Dept. 1) [address], [place] 2) [address] [place] 3)  [address], [place] [phone]  [phone]   [facility] Child Abuse Hotline 773-508-6627 or [phone] (After Hours)      The x-rays have been reviewed.  There is no fracture with in the surgical appliance above or below the previous fractures  There is no effusion in your knee you have  been given medication for inflammatory as well as a muscle relaxer and given a resource list to help you find a primary care physician

## 2015-06-06 ENCOUNTER — Emergency Department (HOSPITAL_COMMUNITY): Payer: Self-pay

## 2015-06-06 ENCOUNTER — Emergency Department (HOSPITAL_COMMUNITY)
Admission: EM | Admit: 2015-06-06 | Discharge: 2015-06-06 | Disposition: A | Discharge status: Home / Self Care | Payer: Self-pay | Attending: Emergency Medicine | Admitting: Emergency Medicine

## 2015-06-06 ENCOUNTER — Encounter (HOSPITAL_COMMUNITY): Payer: Self-pay | Admitting: Emergency Medicine

## 2015-06-06 DIAGNOSIS — J4521 Mild intermittent asthma with (acute) exacerbation: Secondary | ICD-10-CM | POA: Insufficient documentation

## 2015-06-06 DIAGNOSIS — J069 Acute upper respiratory infection, unspecified: Secondary | ICD-10-CM | POA: Insufficient documentation

## 2015-06-06 DIAGNOSIS — Z7951 Long term (current) use of inhaled steroids: Secondary | ICD-10-CM | POA: Insufficient documentation

## 2015-06-06 DIAGNOSIS — F17200 Nicotine dependence, unspecified, uncomplicated: Secondary | ICD-10-CM | POA: Insufficient documentation

## 2015-06-06 DIAGNOSIS — Z862 Personal history of diseases of the blood and blood-forming organs and certain disorders involving the immune mechanism: Secondary | ICD-10-CM | POA: Insufficient documentation

## 2015-06-06 DIAGNOSIS — M546 Pain in thoracic spine: Secondary | ICD-10-CM | POA: Insufficient documentation

## 2015-06-06 DIAGNOSIS — Z79899 Other long term (current) drug therapy: Secondary | ICD-10-CM | POA: Insufficient documentation

## 2015-06-06 MED ORDER — IPRATROPIUM-ALBUTEROL 0.5-2.5 (3) MG/3ML IN SOLN
3.0000 mL | Freq: Once | RESPIRATORY_TRACT | Status: AC
  Administered 2015-06-06: 3 mL via RESPIRATORY_TRACT
  Filled 2015-06-06: qty 3

## 2015-06-06 MED ORDER — ALBUTEROL SULFATE (2.5 MG/3ML) 0.083% IN NEBU
5.0000 mg | INHALATION_SOLUTION | Freq: Once | RESPIRATORY_TRACT | Status: AC
  Administered 2015-06-06: 5 mg via RESPIRATORY_TRACT
  Filled 2015-06-06: qty 6

## 2015-06-06 MED ORDER — ALBUTEROL (5 MG/ML) CONTINUOUS INHALATION SOLN
10.0000 mg/h | INHALATION_SOLUTION | RESPIRATORY_TRACT | Status: DC
  Filled 2015-06-06: qty 40

## 2015-06-06 MED ORDER — ALBUTEROL SULFATE HFA 108 (90 BASE) MCG/ACT IN AERS
2.0000 | INHALATION_SPRAY | Freq: Once | RESPIRATORY_TRACT | Status: AC
  Administered 2015-06-06: 2 via RESPIRATORY_TRACT
  Filled 2015-06-06: qty 6.7

## 2015-06-06 MED ORDER — BENZONATATE 100 MG PO CAPS: 200.0000 mg | ORAL_CAPSULE | Freq: Two times a day (BID) | ORAL | Status: DC | PRN

## 2015-06-06 MED ORDER — OXYMETAZOLINE HCL 0.05 % NA SOLN: 1.0000 | Freq: Two times a day (BID) | NASAL | Status: DC

## 2015-06-06 MED ORDER — DEXAMETHASONE SODIUM PHOSPHATE 10 MG/ML IJ SOLN
10.0000 mg | Freq: Once | INTRAMUSCULAR | Status: AC
  Administered 2015-06-06: 10 mg via INTRAMUSCULAR
  Filled 2015-06-06: qty 1

## 2015-06-06 NOTE — ED Provider Notes (Signed)
CSN: [id number]     Arrival date & time 06/06/15  8295 History   First MD Initiated Contact with Patient 06/06/15 1013     Chief Complaint  Patient presents with  . Cough  . Nasal Congestion     (Consider location/radiation/quality/duration/timing/severity/associated sxs/prior Treatment) HPI   Patient is a 35 year old male with past medical history of asthma and sarcoidosis who presents to the ED with complaint of cough and nasal congestion, onset one week. Patient reports having worsening nonproductive cough, nasal congestion, rhinorrhea, shortness of breath and wheezing. He also reports having mild sharp chest pain and upper back pain that occurs with coughing. He states he has been taking over-the-counter cold medication with minimal relief and notes he recently ran out of his albuterol inhaler. Patient states he was seen at an urgent care earlier this week, given a breathing treatment and discharged home with steroids. He states he took his last steroid this morning but notes his symptoms have not improved. Denies fever, chills, body aches, headache, sore throat, coughing up blood, palpitations, abdominal pain, nausea, vomiting, diarrhea. Patient reports his children and ex-wife have also had similar URI symptoms over the past few weeks.  Past Medical History  Diagnosis Date  . Asthma   . Sarcoidosis (HCC)    History reviewed. No pertinent past surgical history. No family history on file. Social History  Substance Use Topics  . Smoking status: Current Every Day Smoker  . Smokeless tobacco: None  . Alcohol Use: No    Review of Systems  Constitutional: Negative for fever.  HENT: Positive for congestion and rhinorrhea. Negative for sore throat and trouble swallowing.   Respiratory: Positive for cough, shortness of breath and wheezing.   Cardiovascular: Positive for chest pain (with coughing).  Gastrointestinal: Negative for nausea, vomiting, abdominal pain and diarrhea.   Musculoskeletal: Positive for back pain (with coughing).  Skin: Negative for rash.  Neurological: Negative for headaches.      Allergies  Shellfish allergy  Home Medications   Prior to Admission medications   Medication Sig Start Date End Date Taking? Authorizing Provider  albuterol (PROVENTIL HFA;VENTOLIN HFA) 108 (90 Base) MCG/ACT inhaler Inhale 1-2 puffs into the lungs every 6 (six) hours as needed for wheezing or shortness of breath.   Yes Historical Provider, MD  Budesonide-Formoterol Fumarate (SYMBICORT IN) Inhale 2 puffs into the lungs 2 (two) times daily.   Yes Historical Provider, MD  benzonatate (TESSALON) 100 MG capsule Take 2 capsules (200 mg total) by mouth 2 (two) times daily as needed for cough. 06/06/15   Barrett Henle, PA-C  ibuprofen (ADVIL,MOTRIN) 800 MG tablet Take 1 tablet (800 mg total) by mouth 3 (three) times daily. Patient not taking: Reported on 06/06/2015 12/01/14   Earley Favor, NP  methocarbamol (ROBAXIN) 500 MG tablet Take 1 tablet (500 mg total) by mouth 2 (two) times daily. Patient not taking: Reported on 06/06/2015 12/01/14   Earley Favor, NP  oxymetazoline [facility] NASAL SPRAY) 0.05 % nasal spray Place 1 spray into both nostrils 2 (two) times daily. 06/06/15   Satira Sark Nadeau, PA-C   BP 137/76 mmHg  Pulse 66  Temp(Src) 98 F (36.7 C) (Oral)  Resp 22  SpO2 99% Physical Exam  Constitutional: He is oriented to person, place, and time. He appears well-developed and well-nourished.  HENT:  Head: Normocephalic and atraumatic.  Nose: Nose normal. Right sinus exhibits no maxillary sinus tenderness and no frontal sinus tenderness. Left sinus exhibits no maxillary sinus tenderness and no  frontal sinus tenderness.  Mouth/Throat: Uvula is midline, oropharynx is clear and moist and mucous membranes are normal. No oropharyngeal exudate, posterior oropharyngeal edema, posterior oropharyngeal erythema or tonsillar abscesses.  Eyes: Conjunctivae and EOM  are normal. Right eye exhibits no discharge. Left eye exhibits no discharge. No scleral icterus.  Neck: Normal range of motion. Neck supple.  Cardiovascular: Normal rate, regular rhythm, normal heart sounds and intact distal pulses.   Pulmonary/Chest: Effort normal. No respiratory distress. He has wheezes (diffuse expiratory wheezes noetd bilaterally). He has no rales. He exhibits no tenderness.  Abdominal: Soft. He exhibits no distension.  Musculoskeletal: Normal range of motion. He exhibits no edema.  Lymphadenopathy:    He has no cervical adenopathy.  Neurological: He is alert and oriented to person, place, and time.  Skin: Skin is warm and dry. He is not diaphoretic.  Nursing note and vitals reviewed.   ED Course  Procedures (including critical care time) Labs Review Labs Reviewed - No data to display  Imaging Review Dg Chest 2 View  06/06/2015  CLINICAL DATA:  Short of breath EXAM: CHEST  2 VIEW COMPARISON:  06/26/2014 FINDINGS: Normal heart size. Bilateral upper lobe hazy opacities are nearly resolved. There is some residual hazy opacity. Lungs are under aerated. No pneumothorax. Metal linear object projects over the neck of unknown significance. IMPRESSION: Bilateral upper lobe airspace disease has improved. Electronically Signed   By: Jolaine Click M.D.   On: 06/06/2015 10:31   I have personally reviewed and evaluated these images and lab results as part of my medical decision-making.  Filed Vitals:   06/06/15 0949 06/06/15 1210  BP: 137/76   Pulse: 80 66  Temp: 98 F (36.7 C)   Resp: 22      MDM   Final diagnoses:  URI (upper respiratory infection)  Asthma, mild intermittent, with acute exacerbation    Patient's symptoms consistent with asthma exacerbation with URI, likely viral etiology. Patient ambulated in ED with O2 saturations maintained >90, no current signs of respiratory distress. Lung exam improved after nebulizer treatment. Prednisone given in the ED. Pt  states they are breathing at baseline. Patient discharged home with decongestion, antitussive and albuterol inhaler. Patient given resource guide to follow up with PCP.  Evaluation does not show pathology requring ongoing emergent intervention or admission. Pt is hemodynamically stable and mentating appropriately. Discussed findings/results and plan with patient/guardian, who agrees with plan. All questions answered. Return precautions discussed and outpatient follow up given.      Satira Sark [place], [place] 06/06/15 1231  Vanetta Mulders, MD 06/08/15 [phone]

## 2015-06-06 NOTE — Discharge Instructions (Signed)
Take your medications as prescribed. I also recommend continuing to use her albuterol inhaler as needed or wheezing/shortness of breath. Continue drinking fluids to remain hydrated. Please follow up with a primary care provider from the Resource Guide provided below in 5 days. Please return to the Emergency Department if symptoms worsen or new onset of fever, coughing up blood, chest pain, shortness of breath, wheezing, lightheadedness, dizziness.    Emergency Department Resource Guide 1) Find a Doctor and Pay Out of Pocket Although you won't have to find out who is covered by your insurance plan, it is a good idea to ask around and get recommendations. You will then need to call the office and see if the doctor you have chosen will accept you as a new patient and what types of options they offer for patients who are self-pay. Some doctors offer discounts or will set up payment plans for their patients who do not have insurance, but you will need to ask so you aren't surprised when you get to your appointment.  2) Contact Your Local Health Department Not all health departments have doctors that can see patients for sick visits, but many do, so it is worth a call to see if yours does. If you don't know where your local health department is, you can check in your phone book. The CDC also has a tool to help you locate your state's health department, and many state websites also have listings of all of their local health departments.  3) Find a Walk-in Clinic If your illness is not likely to be very severe or complicated, you may want to try a walk in clinic. These are popping up all over the country in pharmacies, drugstores, and shopping centers. They're usually staffed by nurse practitioners or physician assistants that have been trained to treat common illnesses and complaints. They're usually fairly quick and inexpensive. However, if you have serious medical issues or chronic medical problems, these  are probably not your best option.  No Primary Care Doctor: - Call Health Connect at  [phone] - they can help you locate a primary care doctor that  accepts your insurance, provides certain services, etc. - Physician Referral Service- [phone]  Chronic Pain Problems: Organization         Address  Phone   Notes  [facility]  [phone] Patients need to be referred by their primary care doctor.   Medication Assistance: Organization         Address  Phone   Notes  [facility] Medication [facility] [address] [place] [place], [place] [address] [phone] --Must be a resident of [facility] -- Must have NO insurance coverage whatsoever (no Medicaid/ Medicare, etc.) -- The pt. MUST have a primary care doctor that directs their care regularly and follows them in the community   [facility]ist  [phone]   [employer]  [phone]    Agencies that provide inexpensive medical care: Organization         Address  Phone   Notes  [facility]  [phone]   [facility]    [phone]   [facility] [address] [place], [place] [address] [phone]   [facility] [address] [place] [place] [phone]   Planned Parenthood    [phone]   [facility]    [phone]   Community Health and [facility]  [address], [place] Phone:  [phone], Fax:  [phone] Hours of Operation:  9 am - 6 pm, M-F.  Also accepts Medicaid/Medicare and self-pay.  [facility] for [place], [address], [place] Phone: [phone], Fax: [phone]. Hours of Operation:  8:30 am - 5:30 pm, M-F.  Also accepts Medicaid and self-pay.  [facility] [place] [address], [place] Phone: [phone]   [place], [place], [place] [phone], Ext. [phone] Mondays &  Thursdays: 7-9 AM.  First 15 patients are seen on a first come, first serve basis.    [place] Providers:  Organization         Address  Phone   Notes  [facility] [address], [place] [phone] Also accepts self-pay patients.  [facility] [id number] [place], [place]  [phone]   [place], [address], [place] [phone]   [facility] [address] [place] [phone]   Lucianne Lei [address], [place]   [phone] Only accepts [place] Access [place] patients after they have their name applied to their card.   Self-Pay (no insurance) in [facility]:  Organization         Address  Phone   Notes  Sickle Cell Patients, [facility] [place] [phone]   [facility] [place] [phone]   [facility] [place]  [place], [place], [place] [phone]   [facility]/Dr. Osei-Bonsu  [address] [place] or [address], [place] [phone] Phone number for both [place] and [place] locations is the same.  Urgent [facility] and [facility] [address] [place] [phone]   [facility] [address], [place] or [address] [phone]   [facility] [address], [place] [phone], phone; [phone], fax Sees patients 1st and 3rd Saturday of every month.  Must not qualify for public or private insurance (i.e. Medicaid, Medicare, [place] Health Choice, Veterans' Benefits)  Household income should be no more than 200% of the poverty level The clinic cannot treat you if you are pregnant or think you are pregnant  Sexually transmitted diseases are not treated at the  clinic.    Dental Care: Organization         Address  Phone  Notes  [facility] Department of [facility] [employer] [phone] Accepts children up to age 25 who are enrolled in [place] or [place]; pregnant women with a Medicaid card; and children who have applied for Medicaid or [place] Health Choice, but were declined, whose parents can pay a reduced fee at time of service.  [facility] Department of [facility]  [address], [place] [phone] Accepts children up to age 67 who are enrolled in [place] or [place]; pregnant women with a Medicaid card; and children who have applied for Medicaid or [place] Health Choice, but were declined, whose parents can pay a reduced fee at time of service.  [facility] Adult Dental Access PROGRAM  [place], [place] [phone]  Patients are seen by appointment only. Walk-ins are not accepted. [place] will see patients 53 years of age and older. Monday - Tuesday (8am-5pm) Most Wednesdays (8:30-5pm) $30 per visit, cash only  [facility] Adult Dental Access PROGRAM  [address], [facility] [phone] Patients are seen by appointment only. Walk-ins are not accepted. [place] will see patients 76 years of age and older. One Wednesday Evening (Monthly: Volunteer Based).  $30 per visit, cash only  [place]  [phone] for adults; Children under age 83, call Graduate Pediatric Dentistry at [phone]. Children aged 8-14, please call [phone] to request a pediatric application.  Dental services are provided in all areas of dental care including fillings, crowns and bridges, complete and partial dentures, implants, gum treatment, root canals, and extractions. Preventive care is also provided. Treatment is provided to both adults and children. Patients are selected via a lottery and there is often a  waiting list.   [facility] [address], [place]  [phone] [email or web address]   [facility] [address] [place], [place] [phone], Ext. [phone] Second and Fourth Thursday of each month, opens at 6:30 AM; Clinic ends at 9 AM.  Patients are seen on a first-come first-served basis, and a limited number are seen during each clinic.   [facility]  [address] Hillard Danker [place], [place] [phone]   Eligibility Requirements You must have lived in [place], [place], or [place] counties for at least the last three months.   You cannot be eligible for state or federal sponsored [employer], including [employer], [place], or [employer].   You generally cannot be eligible for healthcare insurance through your employer.    How to apply: Eligibility screenings are held every Tuesday and Wednesday afternoon from 1:00 pm until 4:00 pm. You do not need an appointment for the interview!  [facility] [address] [place], [place]   [place]  [place] Department  [place]  [phone]    Behavioral Health Resources in the Community: Intensive Outpatient Programs Organization         Address  Phone  Notes  [place]. [address] [place], [place] [phone]   [facility] Outpatient [address] [place], [place]   ADS: Alcohol & Drug S[facility] [address], [place], [place]   [place] [address],  [place], [place] or [phone]   Substance Abuse Resources Organization         Address  Phone  Notes  Alcohol and Drug Services  [phone]   [place]  [phone]   [facility] [place]   Chinita Pester  [phone]   Residential & Outpatient Substance Abuse  Program  [phone]   Psychological Services Organization         Address  Phone  Notes  [facility] [place]  [place]  [phone]   [place] [address], [place] or [phone]    Mobile Crisis Teams Organization         Address  Phone  Notes  [facility], Mobile Crisis Care Unit  [phone]   [facility]  [address] [place], [place]   [facility] [address], [place]  [address] [place] [phone]    Self-Help/Support Groups Organization         Address  Phone             Notes  [facility]ental [facility]oc. of [place] - variety of support groups  336- I7437963 Call for more information  Narcotics Anonymous (NA), [facility] [address], [employer] [place]  2 meetings at this location   Statistician         Address  Phone  Notes  [facility] [address],    [place]  [phone]   [facility]  [address] [place] [address], [place], [place] [phone]   [facility] [address] [place], [place] [phone] Admissions: 8am-3pm M-F  [facility] [address],    [place], [place] [phone]   [facility] [address], [place], [place] [phone]   [facility] [address]  [place], [place] [phone]   [facility] - Intensive Outpatient [address] Laurell [address], [place], [place] [phone]   [facility] (Addiction Recovery [facility] [address] [place].,  [place], [place] [phone] or [phone]   [facility] ([facility]) [address], [place], [place] [phone] Accepts Medicaid  [facility] [address]  [place] [phone] Substance Abuse/Addiction Treatment   [facility] Organization          Address  Phone  Notes  [facility]  [phone]   Angie Fava, PhD [address] Ervin Knack [place], [place]   [phone] or [phone]   [facility]   [address] [place], [place] [phone]   [facility] [name], [place], [place] [phone] Insurance/Medicaid/sponsorship through [facility] and Families [address]                                    [place], [place] [phone] Therapy/tele-psych/case  [facility] [address], [place] [phone]    Dr. Lolly Mustache  [phone]   [facility]  [employer] [facility] Dept. 1) [address], [place] 2) [address] [place] 3)  [address], [place] [phone]  [phone]   [facility] Child Abuse Hotline 9497739395 or [phone] (After Hours)

## 2015-06-06 NOTE — ED Notes (Addendum)
Cough, congestion, runny nose for approx 1 week, feeling better but ran out of albuterol inhalor, pt is asthmatic. Cough is not better. Took OTC cough meds with minimal relief also had some prednisone left over that he took.

## 2015-07-01 ENCOUNTER — Encounter (HOSPITAL_COMMUNITY): Payer: Self-pay

## 2015-07-01 ENCOUNTER — Emergency Department (HOSPITAL_COMMUNITY)
Admission: EM | Admit: 2015-07-01 | Discharge: 2015-07-01 | Disposition: A | Discharge status: Home / Self Care | Payer: Self-pay | Attending: Emergency Medicine | Admitting: Emergency Medicine

## 2015-07-01 DIAGNOSIS — Z79899 Other long term (current) drug therapy: Secondary | ICD-10-CM | POA: Insufficient documentation

## 2015-07-01 DIAGNOSIS — J45909 Unspecified asthma, uncomplicated: Secondary | ICD-10-CM | POA: Insufficient documentation

## 2015-07-01 DIAGNOSIS — Z202 Contact with and (suspected) exposure to infections with a predominantly sexual mode of transmission: Secondary | ICD-10-CM | POA: Insufficient documentation

## 2015-07-01 DIAGNOSIS — Z862 Personal history of diseases of the blood and blood-forming organs and certain disorders involving the immune mechanism: Secondary | ICD-10-CM | POA: Insufficient documentation

## 2015-07-01 DIAGNOSIS — Z7951 Long term (current) use of inhaled steroids: Secondary | ICD-10-CM | POA: Insufficient documentation

## 2015-07-01 DIAGNOSIS — F17200 Nicotine dependence, unspecified, uncomplicated: Secondary | ICD-10-CM | POA: Insufficient documentation

## 2015-07-01 DIAGNOSIS — Z711 Person with feared health complaint in whom no diagnosis is made: Secondary | ICD-10-CM

## 2015-07-01 LAB — URINALYSIS, ROUTINE W REFLEX MICROSCOPIC
BILIRUBIN URINE: NEGATIVE
Glucose, UA: NEGATIVE mg/dL
Hgb urine dipstick: NEGATIVE
KETONES UR: NEGATIVE mg/dL
Leukocytes, UA: NEGATIVE
NITRITE: NEGATIVE
PROTEIN: NEGATIVE mg/dL
Specific Gravity, Urine: 1.015 (ref 1.005–1.030)
pH: 7 (ref 5.0–8.0)

## 2015-07-01 MED ORDER — METRONIDAZOLE 500 MG PO TABS: 500.0000 mg | ORAL_TABLET | Freq: Two times a day (BID) | ORAL | Status: DC

## 2015-07-01 MED ORDER — CEFTRIAXONE SODIUM 250 MG IJ SOLR
250.0000 mg | Freq: Once | INTRAMUSCULAR | Status: AC
  Administered 2015-07-01: 250 mg via INTRAMUSCULAR
  Filled 2015-07-01: qty 250

## 2015-07-01 MED ORDER — STERILE WATER FOR INJECTION IJ SOLN
INTRAMUSCULAR | Status: AC
  Administered 2015-07-01: 10 mL
  Filled 2015-07-01: qty 10

## 2015-07-01 MED ORDER — AZITHROMYCIN 250 MG PO TABS
1000.0000 mg | ORAL_TABLET | Freq: Once | ORAL | Status: AC
Start: 2015-07-01 — End: 2015-07-01
  Administered 2015-07-01: 1000 mg via ORAL
  Filled 2015-07-01: qty 4

## 2015-07-01 NOTE — Discharge Instructions (Signed)
Sexually Transmitted Disease °A sexually transmitted disease (STD) is a disease or infection that may be passed (transmitted) from person to person, usually during sexual activity. This may happen by way of saliva, semen, blood, vaginal mucus, or urine. Common STDs include: °· Gonorrhea. °· Chlamydia. °· Syphilis. °· HIV and AIDS. °· Genital herpes. °· Hepatitis B and C. °· Trichomonas. °· Human papillomavirus (HPV). °· Pubic lice. °· Scabies. °· Mites. °· Bacterial vaginosis. °WHAT ARE CAUSES OF STDs? °An STD may be caused by bacteria, a virus, or parasites. STDs are often transmitted during sexual activity if one person is infected. However, they may also be transmitted through nonsexual means. STDs may be transmitted after:  °· Sexual intercourse with an infected person. °· Sharing sex toys with an infected person. °· Sharing needles with an infected person or using unclean piercing or tattoo needles. °· Having intimate contact with the genitals, mouth, or rectal areas of an infected person. °· Exposure to infected fluids during birth. °WHAT ARE THE SIGNS AND SYMPTOMS OF STDs? °Different STDs have different symptoms. Some people may not have any symptoms. If symptoms are present, they may include: °· Painful or bloody urination. °· Pain in the pelvis, abdomen, vagina, anus, throat, or eyes. °· A skin rash, itching, or irritation. °· Growths, ulcerations, blisters, or sores in the genital and anal areas. °· Abnormal vaginal discharge with or without bad odor. °· Penile discharge in men. °· Fever. °· Pain or bleeding during sexual intercourse. °· Swollen glands in the groin area. °· Yellow skin and eyes (jaundice). This is seen with hepatitis. °· Swollen testicles. °· Infertility. °· Sores and blisters in the mouth. °HOW ARE STDs DIAGNOSED? °To make a diagnosis, your health care provider may: °· Take a medical history. °· Perform a physical exam. °· Take a sample of any discharge to examine. °· Swab the throat,  cervix, opening to the penis, rectum, or vagina for testing. °· Test a sample of your first morning urine. °· Perform blood tests. °· Perform a Pap test, if this applies. °· Perform a colposcopy. °· Perform a laparoscopy. °HOW ARE STDs TREATED? °Treatment depends on the STD. Some STDs may be treated but not cured. °· Chlamydia, gonorrhea, trichomonas, and syphilis can be cured with antibiotic medicine. °· Genital herpes, hepatitis, and HIV can be treated, but not cured, with prescribed medicines. The medicines lessen symptoms. °· Genital warts from HPV can be treated with medicine or by freezing, burning (electrocautery), or surgery. Warts may come back. °· HPV cannot be cured with medicine or surgery. However, abnormal areas may be removed from the cervix, vagina, or vulva. °· If your diagnosis is confirmed, your recent sexual partners need treatment. This is true even if they are symptom-free or have a negative culture or evaluation. They should not have sex until their health care providers say it is okay. °· Your health care provider may test you for infection again 3 months after treatment. °HOW CAN I REDUCE MY RISK OF GETTING AN STD? °Take these steps to reduce your risk of getting an STD: °· Use latex condoms, dental dams, and water-soluble lubricants during sexual activity. Do not use petroleum jelly or oils. °· Avoid having multiple sex partners. °· Do not have sex with someone who has other sex partners °· Do not have sex with anyone you do not know or who is at high risk for an STD. °· Avoid risky sex practices that can break your skin. °· Do not have sex   if you have open sores on your mouth or skin.  Avoid drinking too much alcohol or taking illegal drugs. Alcohol and drugs can affect your judgment and put you in a vulnerable position.  Avoid engaging in oral and anal sex acts.  Get vaccinated for HPV and hepatitis. If you have not received these vaccines in the past, talk to your health care  provider about whether one or both might be right for you.  If you are at risk of being infected with HIV, it is recommended that you take a prescription medicine daily to prevent HIV infection. This is called pre-exposure prophylaxis (PrEP). You are considered at risk if:  You are a man who has sex with other men (MSM).  You are a heterosexual man or woman and are sexually active with more than one partner.  You take drugs by injection.  You are sexually active with a partner who has HIV.  Talk with your health care provider about whether you are at high risk of being infected with HIV. If you choose to begin PrEP, you should first be tested for HIV. You should then be tested every 3 months for as long as you are taking PrEP. WHAT SHOULD I DO IF I THINK I HAVE AN STD?  See your health care provider.  Tell your sexual partner(s). They should be tested and treated for any STDs.  Do not have sex until your health care provider says it is okay. WHEN SHOULD I GET IMMEDIATE MEDICAL CARE? Contact your health care provider right away if:   You have severe abdominal pain.  You are a man and notice swelling or pain in your testicles.  You are a woman and notice swelling or pain in your vagina.   This information is not intended to replace advice given to you by your health care provider. Make sure you discuss any questions you have with your health care provider.   Document Released: 06/24/2002 Document Revised: 04/24/2014 Document Reviewed: 10/22/2012 Elsevier Interactive Patient Education 2016 [employer].  Safe Sex Safe sex is about reducing the risk of giving or getting a sexually transmitted disease (STD). STDs are spread through sexual contact involving the genitals, mouth, or rectum. Some STDs can be cured and others cannot. Safe sex can also prevent unintended pregnancies.  WHAT ARE SOME SAFE SEX PRACTICES?  Limit your sexual activity to only one partner who is having sex with  only you.  Talk to your partner about his or her past partners, past STDs, and drug use.  Use a condom every time you have sexual intercourse. This includes vaginal, oral, and anal sexual activity. Both females and males should wear condoms during oral sex. Only use latex or polyurethane condoms and water-based lubricants. Using petroleum-based lubricants or oils to lubricate a condom will weaken the condom and increase the chance that it will break. The condom should be in place from the beginning to the end of sexual activity. Wearing a condom reduces, but does not completely eliminate, your risk of getting or giving an STD. STDs can be spread by contact with infected body fluids and skin.  Get vaccinated for hepatitis B and HPV.  Avoid alcohol and recreational drugs, which can affect your judgment. You may forget to use a condom or participate in high-risk sex.  For females, avoid douching after sexual intercourse. Douching can spread an infection farther into the reproductive tract.  Check your body for signs of sores, blisters, rashes, or unusual discharge.  See your health care provider if you notice any of these signs.  Avoid sexual contact if you have symptoms of an infection or are being treated for an STD. If you or your partner has herpes, avoid sexual contact when blisters are present. Use condoms at all other times.  If you are at risk of being infected with HIV, it is recommended that you take a prescription medicine daily to prevent HIV infection. This is called pre-exposure prophylaxis (PrEP). You are considered at risk if:  You are a man who has sex with other men (MSM).  You are a heterosexual man or woman who is sexually active with more than one partner.  You take drugs by injection.  You are sexually active with a partner who has HIV.  Talk with your health care provider about whether you are at high risk of being infected with HIV. If you choose to begin PrEP, you  should first be tested for HIV. You should then be tested every 3 months for as long as you are taking PrEP.  See your health care provider for regular screenings, exams, and tests for other STDs. Before having sex with a new partner, each of you should be screened for STDs and should talk about the results with each other. WHAT ARE THE BENEFITS OF SAFE SEX?   There is less chance of getting or giving an STD.  You can prevent unwanted or unintended pregnancies.  By discussing safe sex concerns with your partner, you may increase feelings of intimacy, comfort, trust, and honesty between the two of you.   This information is not intended to replace advice given to you by your health care provider. Make sure you discuss any questions you have with your health care provider.   Document Released: 05/11/2004 Document Revised: 04/24/2014 Document Reviewed: 09/25/2011 Elsevier Interactive Patient Education 2016 [employer]  Trichomoniasis Trichomoniasis is an infection caused by an organism called Trichomonas. The infection can affect both women and men. In women, the outer male genitalia and the vagina are affected. In men, the penis is mainly affected, but the prostate and other reproductive organs can also be involved. Trichomoniasis is a sexually transmitted infection (STI) and is most often passed to another person through sexual contact.  RISK FACTORS  Having unprotected sexual intercourse.  Having sexual intercourse with an infected partner. SIGNS AND SYMPTOMS  Symptoms of trichomoniasis in women include:  Abnormal gray-green frothy vaginal discharge.  Itching and irritation of the vagina.  Itching and irritation of the area outside the vagina. Symptoms of trichomoniasis in men include:   Penile discharge with or without pain.  Pain during urination. This results from inflammation of the urethra. DIAGNOSIS  Trichomoniasis may be found during a Pap test or physical exam. Your  health care provider may use one of the following methods to help diagnose this infection:  Testing the pH of the vagina with a test tape.  Using a vaginal swab test that checks for the Trichomonas organism. A test is available that provides results within a few minutes.  Examining a urine sample.  Testing vaginal secretions. Your health care provider may test you for other STIs, including HIV. TREATMENT   You may be given medicine to fight the infection. Women should inform their health care provider if they could be or are pregnant. Some medicines used to treat the infection should not be taken during pregnancy.  Your health care provider may recommend over-the-counter medicines or creams to decrease itching  or irritation.  Your sexual partner will need to be treated if infected.  Your health care provider may test you for infection again 3 months after treatment. HOME CARE INSTRUCTIONS   Take medicines only as directed by your health care provider.  Take over-the-counter medicine for itching or irritation as directed by your health care provider.  Do not have sexual intercourse while you have the infection.  Women should not douche or wear tampons while they have the infection.  Discuss your infection with your partner. Your partner may have gotten the infection from you, or you may have gotten it from your partner.  Have your sex partner get examined and treated if necessary.  Practice safe, informed, and protected sex.  See your health care provider for other STI testing. SEEK MEDICAL CARE IF:   You still have symptoms after you finish your medicine.  You develop abdominal pain.  You have pain when you urinate.  You have bleeding after sexual intercourse.  You develop a rash.  Your medicine makes you sick or makes you throw up (vomit). MAKE SURE YOU:  Understand these instructions.  Will watch your condition.  Will get help right away if you are not doing  well or get worse.   This information is not intended to replace advice given to you by your health care provider. Make sure you discuss any questions you have with your health care provider.   Document Released: 09/27/2000 Document Revised: 04/24/2014 Document Reviewed: 01/13/2013 Elsevier Interactive Patient Education [employer]

## 2015-07-01 NOTE — ED Notes (Signed)
When entering PT room  Pt was on cell phone talking and to busty to stop for blood draw and urine sample.

## 2015-07-01 NOTE — ED Notes (Signed)
Patient here to make sure he doesn't have STD-significant other diagnosed with [place]

## 2015-07-01 NOTE — ED Provider Notes (Signed)
CSN: [id number]     Arrival date & time 07/01/15  1021 History  By signing my name below, I, Evon Slackerrance Branch, attest that this documentation has been prepared under the direction and in the presence of Donalda Job, PA-C. Electronically Signed: Evon Slackerrance Branch, ED Scribe. 07/01/2015. 11:31 AM.     Chief Complaint  Patient presents with  . STD check    The history is provided by the patient. No language interpreter was used.   HPI Comments: Shawn Raymond is a 35 y.o. male who presents to the Emergency Department complaining of possible STD exposure 4 days prior. Pt states that his girlfriends was recently diagnosed with trichomonas. He is requesting STD testing and prophylactic treatment. Pt denies fever, chills, abdominal pain, nausea, vomiting, painful bowel movements, penile discharge, testicular pain, testicular swelling or dysuria. No current complaints.   Past Medical History  Diagnosis Date  . Asthma   . Sarcoidosis (HCC)    History reviewed. No pertinent past surgical history. No family history on file. Social History  Substance Use Topics  . Smoking status: Current Every Day Smoker  . Smokeless tobacco: None  . Alcohol Use: No    Review of Systems  All other systems reviewed and are negative.    Allergies  Shellfish allergy  Home Medications   Prior to Admission medications   Medication Sig Start Date End Date Taking? Authorizing Provider  albuterol (PROVENTIL HFA;VENTOLIN HFA) 108 (90 Base) MCG/ACT inhaler Inhale 1-2 puffs into the lungs every 6 (six) hours as needed for wheezing or shortness of breath.    Historical Provider, MD  benzonatate (TESSALON) 100 MG capsule Take 2 capsules (200 mg total) by mouth 2 (two) times daily as needed for cough. 06/06/15   Irven Ingalsbe HenleNicole Elizabeth Nadeau, PA-C  Budesonide-Formoterol Fumarate (SYMBICORT IN) Inhale 2 puffs into the lungs 2 (two) times daily.    Historical Provider, MD  ibuprofen (ADVIL,MOTRIN) 800 MG tablet Take 1  tablet (800 mg total) by mouth 3 (three) times daily. Patient not taking: Reported on 06/06/2015 12/01/14   Earley FavorGail Schulz, NP  methocarbamol (ROBAXIN) 500 MG tablet Take 1 tablet (500 mg total) by mouth 2 (two) times daily. Patient not taking: Reported on 06/06/2015 12/01/14   Earley FavorGail Schulz, NP  metroNIDAZOLE (FLAGYL) 500 MG tablet Take 1 tablet (500 mg total) by mouth 2 (two) times daily. 07/01/15   Rolm GalaStevi Braylyn Kalter, PA-C  oxymetazoline (AFRIN NASAL SPRAY) 0.05 % nasal spray Place 1 spray into both nostrils 2 (two) times daily. 06/06/15   Satira SarkNicole Elizabeth Nadeau, PA-C   BP 139/70 mmHg  Pulse 63  Temp(Src) 98 F (36.7 C) (Oral)  Resp 16  SpO2 100%   Physical Exam  Constitutional: He is oriented to person, place, and time. He appears well-developed and well-nourished. No distress.  HENT:  Head: Normocephalic and atraumatic.  Right Ear: External ear normal.  Left Ear: External ear normal.  Eyes: Conjunctivae and EOM are normal. Right eye exhibits no discharge. Left eye exhibits no discharge. No scleral icterus.  Neck: Normal range of motion. Neck supple. No tracheal deviation present.  Cardiovascular: Normal rate.   Pulmonary/Chest: Effort normal. No respiratory distress.  Abdominal: Soft. There is no tenderness. There is no rebound and no guarding.  Genitourinary: Testes normal and penis normal. Right testis shows no mass, no swelling and no tenderness. Left testis shows no mass, no swelling and no tenderness. Circumcised. No penile tenderness. No discharge found.  Normal external male genitalia. Exam chaperoned by scribe, Harriett Sineerrance.  Musculoskeletal: Normal range of motion.  Moves all extremities spontaneously  Lymphadenopathy:       Right: No inguinal adenopathy present.       Left: No inguinal adenopathy present.  Neurological: He is alert and oriented to person, place, and time. Coordination normal.  Skin: Skin is warm and dry.  Psychiatric: He has a normal mood and affect. His behavior is  normal.  Nursing note and vitals reviewed.   ED Course  Procedures (including critical care time) DIAGNOSTIC STUDIES: Oxygen Saturation is 100% on RA, normal by my interpretation.    COORDINATION OF CARE: 11:41 AM-Discussed treatment plan with pt at bedside and pt agreed to plan.     Labs Review Labs Reviewed  URINALYSIS, ROUTINE W REFLEX MICROSCOPIC (NOT AT [facility])  RPR  HIV ANTIBODY (ROUTINE TESTING)  GC/CHLAMYDIA PROBE AMP () NOT AT [facility]    Imaging Review No results found.    EKG Interpretation None      MDM   Final diagnoses:  Trichomonas exposure  Concern about STD in male without diagnosis   Patient presenting with concern for STD exposure; girlfriend recently diagnosed with trichomonas. No current symptoms. Patient is afebrile without abdominal tenderness, abdominal pain or painful bowel movements to indicate prostatitis. No tenderness to palpation of the testes or epididymis to suggest orchitis or epididymitis. STD cultures obtained including HIV, syphilis, gonorrhea and chlamydia. Patient has been treated prophylactically with azithromycin and Rocephin. Patient to be discharged with instructions to follow up with PCP as needed. Discussed importance of using protection when sexually active. Pt understands that they have GC/Chlamydia cultures pending and that they will need to inform all sexual partners if results return positive. Will discharge with flagyl for trich exposure. Return precautions given in discharge paperwork and discussed with pt at bedside. Pt stable for discharge  I personally performed the services described in this documentation, which was scribed in my presence. The recorded information has been reviewed and is accurate.     Alveta Heimlich, PA-C 07/01/15 1319  Arby Barrette, MD 07/02/15 [phone]

## 2015-07-01 NOTE — ED Notes (Signed)
Declined W/C at D/C and was escorted to lobby by RN. 

## 2015-07-02 LAB — GC/CHLAMYDIA PROBE AMP (~~LOC~~) NOT AT ARMC
CHLAMYDIA, DNA PROBE: NEGATIVE
NEISSERIA GONORRHEA: NEGATIVE

## 2015-07-02 LAB — RPR: RPR Ser Ql: NONREACTIVE

## 2015-07-02 LAB — HIV ANTIBODY (ROUTINE TESTING W REFLEX): HIV Screen 4th Generation wRfx: NONREACTIVE

## 2015-07-18 ENCOUNTER — Encounter (HOSPITAL_COMMUNITY): Payer: Self-pay | Admitting: *Deleted

## 2015-07-18 ENCOUNTER — Emergency Department (HOSPITAL_COMMUNITY)
Admission: EM | Admit: 2015-07-18 | Discharge: 2015-07-18 | Disposition: A | Discharge status: Home / Self Care | Payer: Self-pay | Attending: Emergency Medicine | Admitting: Emergency Medicine

## 2015-07-18 DIAGNOSIS — R300 Dysuria: Secondary | ICD-10-CM | POA: Insufficient documentation

## 2015-07-18 DIAGNOSIS — R369 Urethral discharge, unspecified: Secondary | ICD-10-CM | POA: Insufficient documentation

## 2015-07-18 DIAGNOSIS — Z862 Personal history of diseases of the blood and blood-forming organs and certain disorders involving the immune mechanism: Secondary | ICD-10-CM | POA: Insufficient documentation

## 2015-07-18 DIAGNOSIS — Z7951 Long term (current) use of inhaled steroids: Secondary | ICD-10-CM | POA: Insufficient documentation

## 2015-07-18 DIAGNOSIS — J45909 Unspecified asthma, uncomplicated: Secondary | ICD-10-CM | POA: Insufficient documentation

## 2015-07-18 DIAGNOSIS — Z79899 Other long term (current) drug therapy: Secondary | ICD-10-CM | POA: Insufficient documentation

## 2015-07-18 DIAGNOSIS — F17210 Nicotine dependence, cigarettes, uncomplicated: Secondary | ICD-10-CM | POA: Insufficient documentation

## 2015-07-18 LAB — URINALYSIS, ROUTINE W REFLEX MICROSCOPIC
BILIRUBIN URINE: NEGATIVE
Glucose, UA: NEGATIVE mg/dL
HGB URINE DIPSTICK: NEGATIVE
KETONES UR: NEGATIVE mg/dL
Leukocytes, UA: NEGATIVE
NITRITE: NEGATIVE
PROTEIN: NEGATIVE mg/dL
Specific Gravity, Urine: 1.02 (ref 1.005–1.030)
pH: 6 (ref 5.0–8.0)

## 2015-07-18 MED ORDER — METRONIDAZOLE 500 MG PO TABS
2000.0000 mg | ORAL_TABLET | Freq: Once | ORAL | Status: AC
  Administered 2015-07-18: 2000 mg via ORAL
  Filled 2015-07-18: qty 4

## 2015-07-18 NOTE — ED Provider Notes (Signed)
CSN: [id number]     Arrival date & time 07/18/15  1214 History   First MD Initiated Contact with Patient 07/18/15 1320     Chief Complaint  Patient presents with  . Groin Pain     (Consider location/radiation/quality/duration/timing/severity/associated sxs/prior Treatment) HPI   35 year old male presents with complaints of groin pain and now discharge with dysuria. Patient was seen on 3/16 with complaints of possible STD exposure. Patient mentioned that his girlfriend was recently diagnosed with trichomonas. He did have an STD test without any concerning finding however he was treated prophylactically with azithromycin and Rocephin. He was also discharged with metronidazole for a week. Patient states instead of taking 1 metronidazole 500 mg by mouth every 12 hours he instead takes 2 metronidazole by mouth in the morning each day. For the past 2-3 days he has notice some urinary discomfort along with white penile discharge which concerns him. He has not been sexually active for the past 3 weeks. He denies having fever, back pain, rectal pain, hematuria, testicular pain or rash.  Past Medical History  Diagnosis Date  . Asthma   . Sarcoidosis (HCC)    History reviewed. No pertinent past surgical history. No family history on file. Social History  Substance Use Topics  . Smoking status: Current Every Day Smoker -- 0.70 packs/day    Types: Cigarettes  . Smokeless tobacco: None  . Alcohol Use: No    Review of Systems  Constitutional: Negative for fever.  Genitourinary: Positive for dysuria and discharge. Negative for hematuria, flank pain, penile swelling, scrotal swelling, penile pain and testicular pain.  Skin: Negative for rash and wound.      Allergies  Shellfish allergy  Home Medications   Prior to Admission medications   Medication Sig Start Date End Date Taking? Authorizing Provider  albuterol (PROVENTIL HFA;VENTOLIN HFA) 108 (90 Base) MCG/ACT inhaler Inhale 1-2 puffs  into the lungs every 6 (six) hours as needed for wheezing or shortness of breath.    Historical Provider, MD  benzonatate (TESSALON) 100 MG capsule Take 2 capsules (200 mg total) by mouth 2 (two) times daily as needed for cough. 06/06/15   Barrett Henle, PA-C  Budesonide-Formoterol Fumarate (SYMBICORT IN) Inhale 2 puffs into the lungs 2 (two) times daily.    Historical Provider, MD  ibuprofen (ADVIL,MOTRIN) 800 MG tablet Take 1 tablet (800 mg total) by mouth 3 (three) times daily. Patient not taking: Reported on 06/06/2015 12/01/14   Earley Favor, NP  methocarbamol (ROBAXIN) 500 MG tablet Take 1 tablet (500 mg total) by mouth 2 (two) times daily. Patient not taking: Reported on 06/06/2015 12/01/14   Earley Favor, NP  metroNIDAZOLE (FLAGYL) 500 MG tablet Take 1 tablet (500 mg total) by mouth 2 (two) times daily. 07/01/15   Rolm Gala Barrett, PA-C  oxymetazoline (AFRIN NASAL SPRAY) 0.05 % nasal spray Place 1 spray into both nostrils 2 (two) times daily. 06/06/15   Satira Sark Nadeau, PA-C   BP 138/89 mmHg  Pulse 61  Temp(Src) 98 F (36.7 C) (Oral)  Resp 16  Ht  (1.702 m)  Wt 90.719 kg  BMI 31.32 kg/m2  SpO2 99% Physical Exam  Constitutional: He appears well-developed and well-nourished. No distress.  HENT:  Head: Atraumatic.  Eyes: Conjunctivae are normal.  Neck: Neck supple.  Genitourinary:  Chaperone present during exam. No inguinal lymphadenopathy or inguinal hernia noted. Normal circumcised penis free of lesion or rash and no obvious penile discharge. Testicles with normal lie, nontender to palpation. Normal  scrotum.  Neurological: He is alert.  Skin: No rash noted.  Psychiatric: He has a normal mood and affect.  Nursing note and vitals reviewed.   ED Course  Procedures (including critical care time)   MDM   Final diagnoses:  Abnormal penile discharge    BP 138/89 mmHg  Pulse 61  Temp(Src) 98 F (36.7 C) (Oral)  Resp 16  Ht 5\' 7"  (1.702 m)  Wt 90.719 kg  BMI  31.32 kg/m2  SpO2 99%   2:25 PM Patient here with complaints of penile discharge and abdominal discomfort after girlfriend was diagnosed with trichomonas several weeks ago. He was seen recently and had a negative syphilis, gonorrhea, chlamydia, HIV, or UTIs test. His UA today shows no signs of urinary tract infection and no obvious trichomonas. He however did not take metronidazole as prescribed and has a reassurance, I will give patient Flagyl 2 g by mouth once during this visit as definitive treatment for Trichomonas. Encouraged sexual abstinence until symptoms resolve.  Fayrene HelperBowie Danyeal Akens, PA-C 07/18/15 1429  Richardean Canalavid H Yao, MD 07/18/15 [phone]

## 2015-07-18 NOTE — Discharge Instructions (Signed)
You have been treated for suspect trichomonas infection.  Avoid sexual activities until your symptoms are completely resolved.  Trichomoniasis Trichomoniasis is an infection caused by an organism called Trichomonas. The infection can affect both women and men. In women, the outer male genitalia and the vagina are affected. In men, the penis is mainly affected, but the prostate and other reproductive organs can also be involved. Trichomoniasis is a sexually transmitted infection (STI) and is most often passed to another person through sexual contact.  RISK FACTORS  Having unprotected sexual intercourse.  Having sexual intercourse with an infected partner. SIGNS AND SYMPTOMS  Symptoms of trichomoniasis in women include:  Abnormal gray-green frothy vaginal discharge.  Itching and irritation of the vagina.  Itching and irritation of the area outside the vagina. Symptoms of trichomoniasis in men include:   Penile discharge with or without pain.  Pain during urination. This results from inflammation of the urethra. DIAGNOSIS  Trichomoniasis may be found during a Pap test or physical exam. Your health care provider may use one of the following methods to help diagnose this infection:  Testing the pH of the vagina with a test tape.  Using a vaginal swab test that checks for the Trichomonas organism. A test is available that provides results within a few minutes.  Examining a urine sample.  Testing vaginal secretions. Your health care provider may test you for other STIs, including HIV. TREATMENT   You may be given medicine to fight the infection. Women should inform their health care provider if they could be or are pregnant. Some medicines used to treat the infection should not be taken during pregnancy.  Your health care provider may recommend over-the-counter medicines or creams to decrease itching or irritation.  Your sexual partner will need to be treated if infected.  Your  health care provider may test you for infection again 3 months after treatment. HOME CARE INSTRUCTIONS   Take medicines only as directed by your health care provider.  Take over-the-counter medicine for itching or irritation as directed by your health care provider.  Do not have sexual intercourse while you have the infection.  Women should not douche or wear tampons while they have the infection.  Discuss your infection with your partner. Your partner may have gotten the infection from you, or you may have gotten it from your partner.  Have your sex partner get examined and treated if necessary.  Practice safe, informed, and protected sex.  See your health care provider for other STI testing. SEEK MEDICAL CARE IF:   You still have symptoms after you finish your medicine.  You develop abdominal pain.  You have pain when you urinate.  You have bleeding after sexual intercourse.  You develop a rash.  Your medicine makes you sick or makes you throw up (vomit). MAKE SURE YOU:  Understand these instructions.  Will watch your condition.  Will get help right away if you are not doing well or get worse.   This information is not intended to replace advice given to you by your health care provider. Make sure you discuss any questions you have with your health care provider.   Document Released: 09/27/2000 Document Revised: 04/24/2014 Document Reviewed: 01/13/2013 Elsevier Interactive Patient Education [employer]2016 Elsevier Inc.

## 2015-07-18 NOTE — ED Notes (Signed)
States had STD tx 2 weeks ago. Was called with NEG results. Now has penile discharge.

## 2015-07-18 NOTE — ED Notes (Signed)
Pt in last week & was given med for STD exposure, today pt c/o groin pain, reports penile white discharge, pt reports dysuria, pt denies hematuria, A&O x4, follows commands, speaks in complete sentences, A&O x4

## 2015-08-13 ENCOUNTER — Ambulatory Visit (HOSPITAL_COMMUNITY)
Admission: EM | Admit: 2015-08-13 | Discharge: 2015-08-13 | Disposition: A | Discharge status: Home / Self Care | Payer: Self-pay | Attending: Internal Medicine | Admitting: Internal Medicine

## 2015-08-13 ENCOUNTER — Encounter (HOSPITAL_COMMUNITY): Payer: Self-pay | Admitting: Emergency Medicine

## 2015-08-13 DIAGNOSIS — J4531 Mild persistent asthma with (acute) exacerbation: Secondary | ICD-10-CM | POA: Insufficient documentation

## 2015-08-13 DIAGNOSIS — Z720 Tobacco use: Secondary | ICD-10-CM

## 2015-08-13 DIAGNOSIS — Z202 Contact with and (suspected) exposure to infections with a predominantly sexual mode of transmission: Secondary | ICD-10-CM | POA: Insufficient documentation

## 2015-08-13 DIAGNOSIS — Z79899 Other long term (current) drug therapy: Secondary | ICD-10-CM | POA: Insufficient documentation

## 2015-08-13 DIAGNOSIS — D869 Sarcoidosis, unspecified: Secondary | ICD-10-CM | POA: Insufficient documentation

## 2015-08-13 DIAGNOSIS — Z711 Person with feared health complaint in whom no diagnosis is made: Secondary | ICD-10-CM

## 2015-08-13 DIAGNOSIS — F17210 Nicotine dependence, cigarettes, uncomplicated: Secondary | ICD-10-CM | POA: Insufficient documentation

## 2015-08-13 MED ORDER — ALBUTEROL SULFATE (2.5 MG/3ML) 0.083% IN NEBU
2.5000 mg | INHALATION_SOLUTION | Freq: Once | RESPIRATORY_TRACT | Status: AC
  Administered 2015-08-13: 2.5 mg via RESPIRATORY_TRACT

## 2015-08-13 MED ORDER — CEFTRIAXONE SODIUM 250 MG IJ SOLR
250.0000 mg | Freq: Once | INTRAMUSCULAR | Status: AC
  Administered 2015-08-13: 250 mg via INTRAMUSCULAR

## 2015-08-13 MED ORDER — IPRATROPIUM-ALBUTEROL 0.5-2.5 (3) MG/3ML IN SOLN
RESPIRATORY_TRACT | Status: AC
Start: 2015-08-13 — End: 2015-08-13
  Filled 2015-08-13: qty 3

## 2015-08-13 MED ORDER — LIDOCAINE HCL (PF) 1 % IJ SOLN
INTRAMUSCULAR | Status: AC
  Filled 2015-08-13: qty 5

## 2015-08-13 MED ORDER — CEFTRIAXONE SODIUM 250 MG IJ SOLR
INTRAMUSCULAR | Status: AC
  Filled 2015-08-13: qty 250

## 2015-08-13 MED ORDER — ALBUTEROL SULFATE HFA 108 (90 BASE) MCG/ACT IN AERS: 2.0000 | INHALATION_SPRAY | RESPIRATORY_TRACT | Status: DC | PRN

## 2015-08-13 MED ORDER — ALBUTEROL SULFATE (2.5 MG/3ML) 0.083% IN NEBU
INHALATION_SOLUTION | RESPIRATORY_TRACT | Status: AC
  Filled 2015-08-13: qty 3

## 2015-08-13 MED ORDER — IPRATROPIUM-ALBUTEROL 0.5-2.5 (3) MG/3ML IN SOLN
3.0000 mL | Freq: Once | RESPIRATORY_TRACT | Status: AC
  Administered 2015-08-13: 3 mL via RESPIRATORY_TRACT

## 2015-08-13 MED ORDER — AZITHROMYCIN 250 MG PO TABS: ORAL_TABLET | ORAL | Status: DC

## 2015-08-13 MED ORDER — PREDNISONE 20 MG PO TABS: ORAL_TABLET | ORAL | Status: DC

## 2015-08-13 NOTE — ED Notes (Signed)
Pt here for asthma flare up onset x2 days associated w/couhg, congestion and wheezing Has run out of rescue inhaler  Also wants to be tested for STDs   A&O x4... No acute distress.

## 2015-08-13 NOTE — ED Provider Notes (Signed)
CSN: [id number]     Arrival date & time 08/13/15  1339 History   First MD Initiated Contact with Patient 08/13/15 1435     Chief Complaint  Patient presents with  . Asthma  . Exposure to STD   (Consider location/radiation/quality/duration/timing/severity/associated sxs/prior Treatment) HPI Comments: 35 year old male with history of asthma presents with an asthma exacerbation. He states he is out of his inhaler. He is also requesting a check for STDs. He denies penile discharge or pain but states he has a funny feeling in the urethra after voiding. Medical records indicate several visits to the emergency department for STD management as well has obtaining prescriptions for asthma.   Past Medical History  Diagnosis Date  . Asthma   . Sarcoidosis (HCC)    History reviewed. No pertinent past surgical history. No family history on file. Social History  Substance Use Topics  . Smoking status: Current Every Day Smoker -- 0.70 packs/day    Types: Cigarettes  . Smokeless tobacco: None  . Alcohol Use: No    Review of Systems  Constitutional: Negative for fever and fatigue.  HENT: Negative.   Respiratory: Positive for shortness of breath and wheezing.   Cardiovascular: Negative for chest pain.  Gastrointestinal: Negative.   Genitourinary: Negative for dysuria, frequency, discharge, penile swelling, scrotal swelling, genital sores and testicular pain.       See history of present illness.  Musculoskeletal: Negative.   Skin: Negative.   Neurological: Negative.     Allergies  Shellfish allergy  Home Medications   Prior to Admission medications   Medication Sig Start Date End Date Taking? Authorizing Provider  albuterol (PROVENTIL HFA;VENTOLIN HFA) 108 (90 Base) MCG/ACT inhaler Inhale 2 puffs into the lungs every 4 (four) hours as needed for wheezing or shortness of breath. 08/13/15   Hayden Rasmussenavid Jillien Yakel, NP  azithromycin (ZITHROMAX) 250 MG tablet Take all 4 tabs po now 08/13/15   Hayden Rasmussenavid Pierre Cumpton,  NP  benzonatate (TESSALON) 100 MG capsule Take 2 capsules (200 mg total) by mouth 2 (two) times daily as needed for cough. 06/06/15   Barrett HenleNicole Elizabeth Nadeau, PA-C  Budesonide-Formoterol Fumarate (SYMBICORT IN) Inhale 2 puffs into the lungs 2 (two) times daily.    Historical Provider, MD  metroNIDAZOLE (FLAGYL) 500 MG tablet Take 1 tablet (500 mg total) by mouth 2 (two) times daily. 07/01/15   Rolm GalaStevi Barrett, PA-C  oxymetazoline (AFRIN NASAL SPRAY) 0.05 % nasal spray Place 1 spray into both nostrils 2 (two) times daily. 06/06/15   Barrett HenleNicole Elizabeth Nadeau, PA-C  predniSONE (DELTASONE) 20 MG tablet 3 Tabs PO Days 1-3, then 2 tabs PO Days 4-6, then 1 tab PO Day 7-9, then Half Tab PO Day 10-12 08/13/15   Hayden Rasmussenavid Habib Kise, NP   Meds Ordered and Administered this Visit   Medications  cefTRIAXone (ROCEPHIN) injection 250 mg (250 mg Intramuscular Given 08/13/15 1556)  albuterol (PROVENTIL) (2.5 MG/3ML) 0.083% nebulizer solution 2.5 mg (2.5 mg Nebulization Given 08/13/15 1556)  ipratropium-albuterol (DUONEB) 0.5-2.5 (3) MG/3ML nebulizer solution 3 mL (3 mLs Nebulization Given 08/13/15 1556)    BP 115/66 mmHg  Pulse 74  Temp(Src) 98.6 F (37 C) (Oral)  Resp 16  SpO2 100% No data found.   Physical Exam  Constitutional: He is oriented to person, place, and time. He appears well-developed and well-nourished. No distress.  Eyes: EOM are normal.  Neck: Normal range of motion. Neck supple.  Cardiovascular: Normal rate.   Pulmonary/Chest: Effort normal. He has wheezes.  Musculoskeletal: He exhibits no edema.  Neurological: He is alert and oriented to person, place, and time. He exhibits normal muscle tone.  Skin: Skin is warm and dry.  Psychiatric: He has a normal mood and affect.  Nursing note and vitals reviewed.   ED Course  Procedures (including critical care time)  Labs Review Labs Reviewed  URINE CYTOLOGY ANCILLARY ONLY    Imaging Review No results found.   Visual Acuity Review  Right Eye  Distance:   Left Eye Distance:   Bilateral Distance:    Right Eye Near:   Left Eye Near:    Bilateral Near:         MDM   1. Asthma exacerbation attacks, mild persistent   2. Concern about STD in male without diagnosis   3. Tobacco abuse disorder     Post DuoNeb patient states he is breathing better. Auscultation reveals decrease in wheezing, improvement in air movement. He continues to have some wheezes bilaterally. Will treat with the medications below. Meds ordered this encounter  Medications  . cefTRIAXone (ROCEPHIN) injection 250 mg    Sig:   . albuterol (PROVENTIL) (2.5 MG/3ML) 0.083% nebulizer solution 2.5 mg    Sig:   . ipratropium-albuterol (DUONEB) 0.5-2.5 (3) MG/3ML nebulizer solution 3 mL    Sig:   . azithromycin (ZITHROMAX) 250 MG tablet    Sig: Take all 4 tabs po now    Dispense:  4 each    Refill:  0    Order Specific Question:  Supervising Provider    Answer:  Eustace Moore [[id number]]  . predniSONE (DELTASONE) 20 MG tablet    Sig: 3 Tabs PO Days 1-3, then 2 tabs PO Days 4-6, then 1 tab PO Day 7-9, then Half Tab PO Day 10-12    Dispense:  20 tablet    Refill:  0    Order Specific Question:  Supervising Provider    Answer:  Eustace Moore [[id number]]  . albuterol (PROVENTIL HFA;VENTOLIN HFA) 108 (90 Base) MCG/ACT inhaler    Sig: Inhale 2 puffs into the lungs every 4 (four) hours as needed for wheezing or shortness of breath.    Dispense:  1 Inhaler    Refill:  0    Order Specific Question:  Supervising Provider    Answer:  Eustace Moore [[id number]]      Urine cytology pending Obtain a PCP ASAP Stop smoking   Hayden Rasmussen, NP 08/13/15 1622

## 2015-08-13 NOTE — Discharge Instructions (Signed)
Asthma Attack Prevention °While you may not be able to control the fact that you have asthma, you can take actions to prevent asthma attacks. The best way to prevent asthma attacks is to maintain good control of your asthma. You can achieve this by: °· Taking your medicines as directed. °· Avoiding things that can irritate your airways or make your asthma symptoms worse (asthma triggers). °· Keeping track of how well your asthma is controlled and of any changes in your symptoms. °· Responding quickly to worsening asthma symptoms (asthma attack). °· Seeking emergency care when it is needed. °WHAT ARE SOME WAYS TO PREVENT AN ASTHMA ATTACK? °Have a Plan °Work with your health care provider to create a written plan for managing and treating your asthma attacks (asthma action plan). This plan includes: °· A list of your asthma triggers and how you can avoid them. °· Information on when medicines should be taken and when their dosages should be changed. °· The use of a device that measures how well your lungs are working (peak flow meter). °Monitor Your Asthma °Use your peak flow meter and record your results in a journal every day. A drop in your peak flow numbers on one or more days may indicate the start of an asthma attack. This can happen even before you start to feel symptoms. You can prevent an asthma attack from getting worse by following the steps in your asthma action plan. °Avoid Asthma Triggers °Work with your asthma health care provider to find out what your asthma triggers are. This can be done by: °· Allergy testing. °· Keeping a journal that notes when asthma attacks occur and the factors that may have contributed to them. °· Determining if there are other medical conditions that are making your asthma worse. °Once you have determined your asthma triggers, take steps to avoid them. This may include avoiding excessive or prolonged exposure to: °· Dust. Have someone dust and vacuum your home for you once or  twice a week. Using a high-efficiency particulate arrestance (HEPA) vacuum is best. °· Smoke. This includes campfire smoke, forest fire smoke, and secondhand smoke from tobacco products. °· Pet dander. Avoid contact with animals that you know you are allergic to. °· Allergens from trees, grasses or pollens. Avoid spending a lot of time outdoors when pollen counts are high, and on very windy days. °· Very cold, dry, or humid air. °· Mold. °· Foods that contain high amounts of sulfites. °· Strong odors. °· Outdoor air pollutants, such as engine exhaust. °· Indoor air pollutants, such as aerosol sprays and fumes from household cleaners. °· Household pests, including dust mites and cockroaches, and pest droppings. °· Certain medicines, including NSAIDs. Always talk to your health care provider before stopping or starting any new medicines. °Medicines °Take over-the-counter and prescription medicines only as told by your health care provider. Many asthma attacks can be prevented by carefully following your medicine schedule. Taking your medicines correctly is especially important when you cannot avoid certain asthma triggers. °Act Quickly °If an asthma attack does happen, acting quickly can decrease how severe it is and how long it lasts. Take these steps:  °· Pay attention to your symptoms. If you are coughing, wheezing, or having difficulty breathing, do not wait to see if your symptoms go away on their own. Follow your asthma action plan. °· If you have followed your asthma action plan and your symptoms are not improving, call your health care provider or seek immediate medical care   at the nearest hospital. °It is important to note how often you need to use your fast-acting rescue inhaler. If you are using your rescue inhaler more often, it may mean that your asthma is not under control. Adjusting your asthma treatment plan may help you to prevent future asthma attacks and help you to gain better control of your  condition. °HOW CAN I PREVENT AN ASTHMA ATTACK WHEN I EXERCISE? °Follow advice from your health care provider about whether you should use your fast-acting inhaler before exercising. Many people with asthma experience exercise-induced bronchoconstriction (EIB). This condition often worsens during vigorous exercise in cold, humid, or dry environments. Usually, people with EIB can stay very active by pre-treating with a fast-acting inhaler before exercising. °  °This information is not intended to replace advice given to you by your health care provider. Make sure you discuss any questions you have with your health care provider. °  °Document Released: 03/22/2009 Document Revised: 12/23/2014 Document Reviewed: 09/03/2014 °Elsevier Interactive Patient Education ©2016 Elsevier Inc. ° °Asthma, Acute Bronchospasm °Acute bronchospasm caused by asthma is also referred to as an asthma attack. Bronchospasm means your air passages become narrowed. The narrowing is caused by inflammation and tightening of the muscles in the air tubes (bronchi) in your lungs. This can make it hard to breathe or cause you to wheeze and cough. °CAUSES °Possible triggers are: °· Animal dander from the skin, hair, or feathers of animals. °· Dust mites contained in house dust. °· Cockroaches. °· Pollen from trees or grass. °· Mold. °· Cigarette or tobacco smoke. °· Air pollutants such as dust, household cleaners, hair sprays, aerosol sprays, paint fumes, strong chemicals, or strong odors. °· Cold air or weather changes. Cold air may trigger inflammation. Winds increase molds and pollens in the air. °· Strong emotions such as crying or laughing hard. °· Stress. °· Certain medicines such as aspirin or beta-blockers. °· Sulfites in foods and drinks, such as dried fruits and wine. °· Infections or inflammatory conditions, such as a flu, cold, or inflammation of the nasal membranes (rhinitis). °· Gastroesophageal reflux disease (GERD). GERD is a condition  where stomach acid backs up into your esophagus. °· Exercise or strenuous activity. °SIGNS AND SYMPTOMS  °· Wheezing. °· Excessive coughing, particularly at night. °· Chest tightness. °· Shortness of breath. °DIAGNOSIS  °Your health care provider will ask you about your medical history and perform a physical exam. A chest X-ray or blood testing may be performed to look for other causes of your symptoms or other conditions that may have triggered your asthma attack.  °TREATMENT  °Treatment is aimed at reducing inflammation and opening up the airways in your lungs.  Most asthma attacks are treated with inhaled medicines. These include quick relief or rescue medicines (such as bronchodilators) and controller medicines (such as inhaled corticosteroids). These medicines are sometimes given through an inhaler or a nebulizer. Systemic steroid medicine taken by mouth or given through an IV tube also can be used to reduce the inflammation when an attack is moderate or severe. Antibiotic medicines are only used if a bacterial infection is present.  °HOME CARE INSTRUCTIONS  °· Rest. °· Drink plenty of liquids. This helps the mucus to remain thin and be easily coughed up. Only use caffeine in moderation and do not use alcohol until you have recovered from your illness. °· Do not smoke. Avoid being exposed to secondhand smoke. °· You play a critical role in keeping yourself in good health. Avoid exposure to things that   cause you to wheeze or to have breathing problems. °· Keep your medicines up-to-date and available. Carefully follow your health care provider's treatment plan. °· Take your medicine exactly as prescribed. °· When pollen or pollution is bad, keep windows closed and use an air conditioner or go to places with air conditioning. °· Asthma requires careful medical care. See your health care provider for a follow-up as advised. If you are more than [redacted] weeks pregnant and you were prescribed any new medicines, let your  obstetrician know about the visit and how you are doing. Follow up with your health care provider as directed. °· After you have recovered from your asthma attack, make an appointment with your outpatient doctor to talk about ways to reduce the likelihood of future attacks. If you do not have a doctor who manages your asthma, make an appointment with a primary care doctor to discuss your asthma. °SEEK IMMEDIATE MEDICAL CARE IF:  °· You are getting worse. °· You have trouble breathing. If severe, call your local emergency services (911 in the [place] °· You develop chest pain or discomfort. °· You are vomiting. °· You are not able to keep fluids down. °· You are coughing up yellow, green, brown, or bloody sputum. °· You have a fever and your symptoms suddenly get worse. °· You have trouble swallowing. °MAKE SURE YOU:  °· Understand these instructions. °· Will watch your condition. °· Will get help right away if you are not doing well or get worse. °  °This information is not intended to replace advice given to you by your health care provider. Make sure you discuss any questions you have with your health care provider. °  °Document Released: 07/19/2006 Document Revised: 04/08/2013 Document Reviewed: 10/09/2012 °Elsevier Interactive Patient Education ©2016 Elsevier Inc. ° °

## 2015-08-13 NOTE — ED Notes (Signed)
Urine specimen obtained, specimen in lab

## 2015-08-16 LAB — URINE CYTOLOGY ANCILLARY ONLY
Chlamydia: NEGATIVE
Neisseria Gonorrhea: NEGATIVE
Trichomonas: NEGATIVE

## 2015-10-21 ENCOUNTER — Encounter (HOSPITAL_COMMUNITY): Payer: Self-pay

## 2015-10-21 ENCOUNTER — Emergency Department (HOSPITAL_COMMUNITY)
Admission: EM | Admit: 2015-10-21 | Discharge: 2015-10-21 | Disposition: A | Discharge status: Home / Self Care | Payer: Self-pay | Attending: Emergency Medicine | Admitting: Emergency Medicine

## 2015-10-21 DIAGNOSIS — J45909 Unspecified asthma, uncomplicated: Secondary | ICD-10-CM | POA: Insufficient documentation

## 2015-10-21 DIAGNOSIS — F17210 Nicotine dependence, cigarettes, uncomplicated: Secondary | ICD-10-CM | POA: Insufficient documentation

## 2015-10-21 DIAGNOSIS — R109 Unspecified abdominal pain: Secondary | ICD-10-CM | POA: Insufficient documentation

## 2015-10-21 LAB — URINALYSIS, ROUTINE W REFLEX MICROSCOPIC
Bilirubin Urine: NEGATIVE
GLUCOSE, UA: NEGATIVE mg/dL
Hgb urine dipstick: NEGATIVE
KETONES UR: NEGATIVE mg/dL
LEUKOCYTES UA: NEGATIVE
NITRITE: NEGATIVE
PROTEIN: NEGATIVE mg/dL
Specific Gravity, Urine: 1.018 (ref 1.005–1.030)
pH: 7 (ref 5.0–8.0)

## 2015-10-21 LAB — CBC WITH DIFFERENTIAL/PLATELET
BASOS PCT: 0 %
Basophils Absolute: 0.1 10*3/uL (ref 0.0–0.1)
Eosinophils Absolute: 0.2 10*3/uL (ref 0.0–0.7)
Eosinophils Relative: 1 %
HEMATOCRIT: 43 % (ref 39.0–52.0)
HEMOGLOBIN: 14.9 g/dL (ref 13.0–17.0)
LYMPHS PCT: 14 %
Lymphs Abs: 1.6 10*3/uL (ref 0.7–4.0)
MCH: 30.3 pg (ref 26.0–34.0)
MCHC: 34.7 g/dL (ref 30.0–36.0)
MCV: 87.4 fL (ref 78.0–100.0)
MONO ABS: 0.5 10*3/uL (ref 0.1–1.0)
Monocytes Relative: 4 %
NEUTROS ABS: 9 10*3/uL — AB (ref 1.7–7.7)
NEUTROS PCT: 81 %
Platelets: 260 10*3/uL (ref 150–400)
RBC: 4.92 MIL/uL (ref 4.22–5.81)
RDW: 13.8 % (ref 11.5–15.5)
WBC: 11.3 10*3/uL — ABNORMAL HIGH (ref 4.0–10.5)

## 2015-10-21 LAB — BASIC METABOLIC PANEL
Anion gap: 7 (ref 5–15)
BUN: 12 mg/dL (ref 6–20)
CALCIUM: 9.4 mg/dL (ref 8.9–10.3)
CHLORIDE: 100 mmol/L — AB (ref 101–111)
CO2: 30 mmol/L (ref 22–32)
CREATININE: 1.1 mg/dL (ref 0.61–1.24)
GFR calc Af Amer: >60 mL/min (ref >60)
GFR calc non Af Amer: >60 mL/min (ref >60)
Glucose, Bld: 92 mg/dL (ref 65–99)
Potassium: 4.3 mmol/L (ref 3.5–5.1)
SODIUM: 137 mmol/L (ref 135–145)

## 2015-10-21 MED ORDER — NAPROXEN 500 MG PO TABS: 500.0000 mg | ORAL_TABLET | Freq: Two times a day (BID) | ORAL | Status: DC

## 2015-10-21 NOTE — Discharge Instructions (Signed)

## 2015-10-21 NOTE — ED Provider Notes (Signed)
CSN: [id number]     Arrival date & time 10/21/15  [id number] History   First MD Initiated Contact with Patient 10/21/15 0848     Chief Complaint  Patient presents with  . Groin Pain  . Abdominal Pain    HPI Pt started having pain in his lower abdomen the last few days.  It is a mild pain but it is nagging.  The pain is constant.  Nothing seems to make it better or worse.   No vomiting or diarrhea.  Appetite has been fine.  No penile discharge.  No burning with urination but there is a tingling sensation Past Medical History  Diagnosis Date  . Asthma   . Sarcoidosis [facility](HCC)    Past Surgical History  Procedure Laterality Date  . Femur fracture surgery    . Patella reconstruction     History reviewed. No pertinent family history. Social History  Substance Use Topics  . Smoking status: Current Every Day Smoker -- 0.70 packs/day    Types: Cigarettes  . Smokeless tobacco: None  . Alcohol Use: No    Review of Systems  Constitutional: Negative for fever.  All other systems reviewed and are negative.     Allergies  Shellfish allergy  Home Medications   Prior to Admission medications   Medication Sig Start Date End Date Taking? Authorizing Provider  albuterol (PROVENTIL HFA;VENTOLIN HFA) 108 (90 Base) MCG/ACT inhaler Inhale 2 puffs into the lungs every 4 (four) hours as needed for wheezing or shortness of breath. 08/13/15   Hayden Rasmussenavid Mabe, NP  azithromycin (ZITHROMAX) 250 MG tablet Take all 4 tabs po now 08/13/15   Hayden Rasmussenavid Mabe, NP  benzonatate (TESSALON) 100 MG capsule Take 2 capsules (200 mg total) by mouth 2 (two) times daily as needed for cough. 06/06/15   Barrett HenleNicole Elizabeth Nadeau, PA-C  Budesonide-Formoterol Fumarate (SYMBICORT IN) Inhale 2 puffs into the lungs 2 (two) times daily.    Historical Provider, MD  metroNIDAZOLE (FLAGYL) 500 MG tablet Take 1 tablet (500 mg total) by mouth 2 (two) times daily. 07/01/15   Rolm GalaStevi Barrett, PA-C  oxymetazoline (AFRIN NASAL SPRAY) 0.05 % nasal spray Place  1 spray into both nostrils 2 (two) times daily. 06/06/15   Barrett HenleNicole Elizabeth Nadeau, PA-C  predniSONE (DELTASONE) 20 MG tablet 3 Tabs PO Days 1-3, then 2 tabs PO Days 4-6, then 1 tab PO Day 7-9, then Half Tab PO Day 10-12 08/13/15   Hayden Rasmussenavid Mabe, NP   BP 122/80 mmHg  Pulse 66  Temp(Src) 98.2 F (36.8 C) (Oral)  Resp 13  Ht 5\' 6"  (1.676 m)  Wt 86.183 kg  BMI 30.68 kg/m2  SpO2 100% Physical Exam  Constitutional: He appears well-developed and well-nourished. No distress.  HENT:  Head: Normocephalic and atraumatic.  Right Ear: External ear normal.  Left Ear: External ear normal.  Eyes: Conjunctivae are normal. Right eye exhibits no discharge. Left eye exhibits no discharge. No scleral icterus.  Neck: Neck supple. No tracheal deviation present.  Cardiovascular: Normal rate, regular rhythm and intact distal pulses.   Pulmonary/Chest: Effort normal and breath sounds normal. No stridor. No respiratory distress. He has no wheezes. He has no rales.  Abdominal: Soft. Bowel sounds are normal. He exhibits no distension. There is no tenderness. There is no rebound and no guarding. No hernia. Hernia confirmed negative in the right inguinal area and confirmed negative in the left inguinal area.  Musculoskeletal: He exhibits no edema or tenderness.  Lymphadenopathy:       Right:  No inguinal adenopathy present.       Left: No inguinal adenopathy present.  Neurological: He is alert. He has normal strength. No cranial nerve deficit (no facial droop, extraocular movements intact, no slurred speech) or sensory deficit. He exhibits normal muscle tone. He displays no seizure activity. Coordination normal.  Skin: Skin is warm and dry. No rash noted.  Psychiatric: He has a normal mood and affect.  Nursing note and vitals reviewed.   ED Course  Procedures (including critical care time) Labs Review Labs Reviewed  CBC WITH DIFFERENTIAL/PLATELET - Abnormal; Notable for the following:    WBC 11.3 (*)    Neutro  Abs 9.0 (*)    All other components within normal limits  BASIC METABOLIC PANEL - Abnormal; Notable for the following:    Chloride 100 (*)    All other components within normal limits  URINALYSIS, ROUTINE W REFLEX MICROSCOPIC (NOT AT [facility])  RPR  HIV ANTIBODY (ROUTINE TESTING)  GC/CHLAMYDIA PROBE AMP ([place]) NOT [facility]      MDM   Final diagnoses:  Abdominal pain, unspecified abdominal location    Serial abdominal exams performed.  No ttp in the rlq.  No inguinal pain. No masses.  Labs show slight increase in WBC but otherwise unremarkable.  Discussed findings with patient.  He denies any penile discharge but requests STD screening.  Will send off labs.  Follow up with a pcp  At this time there does not appear to be any evidence of an acute emergency medical condition and the patient appears stable for discharge with appropriate outpatient follow up.    Linwood DibblesJon Aki Burdin, MD 10/21/15 1105

## 2015-10-21 NOTE — ED Notes (Signed)
Pt c/o bilateral groin and lower abdominal pain x 2 days.  Pain score 4/10.  Denies penile discharged.  Pt reports difficulty urinating and slight dysuria.

## 2015-10-22 LAB — GC/CHLAMYDIA PROBE AMP (~~LOC~~) NOT AT ARMC
Chlamydia: NEGATIVE
NEISSERIA GONORRHEA: NEGATIVE

## 2015-10-22 LAB — RPR: RPR: NONREACTIVE

## 2015-10-22 LAB — HIV ANTIBODY (ROUTINE TESTING W REFLEX): HIV SCREEN 4TH GENERATION: NONREACTIVE

## 2016-01-11 ENCOUNTER — Encounter (HOSPITAL_COMMUNITY): Payer: Self-pay

## 2016-01-11 ENCOUNTER — Emergency Department (HOSPITAL_COMMUNITY)
Admission: EM | Admit: 2016-01-11 | Discharge: 2016-01-11 | Disposition: A | Discharge status: Home / Self Care | Payer: Self-pay | Attending: Emergency Medicine | Admitting: Emergency Medicine

## 2016-01-11 ENCOUNTER — Emergency Department (HOSPITAL_COMMUNITY): Payer: Self-pay

## 2016-01-11 DIAGNOSIS — J45909 Unspecified asthma, uncomplicated: Secondary | ICD-10-CM | POA: Insufficient documentation

## 2016-01-11 DIAGNOSIS — F17210 Nicotine dependence, cigarettes, uncomplicated: Secondary | ICD-10-CM | POA: Insufficient documentation

## 2016-01-11 DIAGNOSIS — Z5321 Procedure and treatment not carried out due to patient leaving prior to being seen by health care provider: Secondary | ICD-10-CM | POA: Insufficient documentation

## 2016-01-11 LAB — BASIC METABOLIC PANEL
Anion gap: 10 (ref 5–15)
BUN: 13 mg/dL (ref 6–20)
CHLORIDE: 106 mmol/L (ref 101–111)
CO2: 24 mmol/L (ref 22–32)
CREATININE: 1.03 mg/dL (ref 0.61–1.24)
Calcium: 10 mg/dL (ref 8.9–10.3)
GFR calc non Af Amer: >60 mL/min (ref >60)
Glucose, Bld: 94 mg/dL (ref 65–99)
POTASSIUM: 4.2 mmol/L (ref 3.5–5.1)
SODIUM: 140 mmol/L (ref 135–145)

## 2016-01-11 LAB — CBC
HEMATOCRIT: 43.8 % (ref 39.0–52.0)
HEMOGLOBIN: 14.8 g/dL (ref 13.0–17.0)
MCH: 30.5 pg (ref 26.0–34.0)
MCHC: 33.8 g/dL (ref 30.0–36.0)
MCV: 90.3 fL (ref 78.0–100.0)
PLATELETS: 264 10*3/uL (ref 150–400)
RBC: 4.85 MIL/uL (ref 4.22–5.81)
RDW: 13.3 % (ref 11.5–15.5)
WBC: 9.2 10*3/uL (ref 4.0–10.5)

## 2016-01-11 LAB — I-STAT TROPONIN, ED: Troponin i, poc: 0 ng/mL (ref 0.00–0.08)

## 2016-01-11 MED ORDER — ALBUTEROL SULFATE (2.5 MG/3ML) 0.083% IN NEBU
5.0000 mg | INHALATION_SOLUTION | Freq: Once | RESPIRATORY_TRACT | Status: AC
  Administered 2016-01-11: 5 mg via RESPIRATORY_TRACT

## 2016-01-11 MED ORDER — ALBUTEROL SULFATE (2.5 MG/3ML) 0.083% IN NEBU
INHALATION_SOLUTION | RESPIRATORY_TRACT | Status: AC
  Filled 2016-01-11: qty 6

## 2016-01-11 NOTE — ED Notes (Signed)
Pt sts he has to pick his kids up off the school bus at 1530 and cannot stay. Pt speaking in complete sentences. NAD, ambulatory.

## 2016-01-11 NOTE — ED Triage Notes (Signed)
PT reports asthma. He reports worse since Friday. Pt has expiratory wheezing, reports using neb treatments at home. No distress noted.

## 2016-01-22 ENCOUNTER — Encounter (HOSPITAL_COMMUNITY): Payer: Self-pay | Admitting: Emergency Medicine

## 2016-01-22 ENCOUNTER — Emergency Department (HOSPITAL_COMMUNITY)
Admission: EM | Admit: 2016-01-22 | Discharge: 2016-01-22 | Disposition: A | Discharge status: Home / Self Care | Payer: Self-pay | Attending: Emergency Medicine | Admitting: Emergency Medicine

## 2016-01-22 DIAGNOSIS — F17210 Nicotine dependence, cigarettes, uncomplicated: Secondary | ICD-10-CM | POA: Insufficient documentation

## 2016-01-22 DIAGNOSIS — J45909 Unspecified asthma, uncomplicated: Secondary | ICD-10-CM | POA: Insufficient documentation

## 2016-01-22 DIAGNOSIS — Z711 Person with feared health complaint in whom no diagnosis is made: Secondary | ICD-10-CM | POA: Insufficient documentation

## 2016-01-22 MED ORDER — CLOTRIMAZOLE 1 % EX CREA
TOPICAL_CREAM | Freq: Two times a day (BID) | CUTANEOUS | Status: DC
  Filled 2016-01-22: qty 15

## 2016-01-22 NOTE — ED Provider Notes (Signed)
   [facility]-EMERGENCY DEPT Provider Note: Lowella DellJ. Lane Perry Molla, MD, FACEP  CSN: [id number] MRN: [id number] ARRIVAL: 01/22/16 at 0210   CHIEF COMPLAINT  Groin Itching   HISTORY OF PRESENT ILLNESS  Shawn Raymond is a 35 y.o. male who complains of bilateral groin itching for the past week. He is concerned about having an STD. He has had unprotected sex. He denies dysuria or penile discharge. Symptoms are mild to moderate.   Past Medical History:  Diagnosis Date  . Asthma   . Sarcoidosis [facility](HCC)     Past Surgical History:  Procedure Laterality Date  . FEMUR FRACTURE SURGERY    . PATELLA RECONSTRUCTION      No family history on file.  Social History  Substance Use Topics  . Smoking status: Current Every Day Smoker    Packs/day: 0.70    Types: Cigarettes  . Smokeless tobacco: Never Used  . Alcohol use No    Prior to Admission medications   Medication Sig Start Date End Date Taking? Authorizing Provider  albuterol (PROVENTIL HFA;VENTOLIN HFA) 108 (90 Base) MCG/ACT inhaler Inhale 2 puffs into the lungs every 4 (four) hours as needed for wheezing or shortness of breath. 08/13/15  Yes Hayden Rasmussenavid Mabe, NP  budesonide-formoterol (SYMBICORT) 80-4.5 MCG/ACT inhaler Inhale 2 puffs into the lungs 2 (two) times daily.   Yes Historical Provider, MD    Allergies Shellfish allergy   REVIEW OF SYSTEMS  Negative except as noted here or in the History of Present Illness.   PHYSICAL EXAMINATION  Initial Vital Signs Blood pressure 137/91, pulse 84, temperature 97.8 F (36.6 C), temperature source Oral, resp. rate 16, height 5\' 6"  (1.676 m), weight 190 lb (86.2 kg), SpO2 99 %.  Examination General: Well-developed, well-nourished male in no acute distress; appearance consistent with age of record HENT: normocephalic; atraumatic Eyes: Normal appearance Neck: supple Heart: regular rate and rhythm Lungs: clear to auscultation bilaterally Abdomen: soft; nondistended; mild suprapubic tenderness; no  masses or hepatosplenomegaly; bowel sounds present GU: Tanner 5 male; no urethral discharge; no testicular tenderness; no groin rash seen Extremities: No deformity; full range of motion; pulses normal Neurologic: Awake, alert and oriented; motor function intact in all extremities and symmetric; no facial droop Skin: Warm and dry Psychiatric: Normal mood and affect   RESULTS  Summary of this visit's results, reviewed by myself:   EKG Interpretation  Date/Time:    Ventricular Rate:    PR Interval:    QRS Duration:   QT Interval:    QTC Calculation:   R Axis:     Text Interpretation:        Laboratory Studies: No results found for this or any previous visit (from the past 24 hour(s)). Imaging Studies: No results found.  ED COURSE  Nursing notes and initial vitals signs, including pulse oximetry, reviewed.  Vitals:   01/22/16 0217 01/22/16 0218  BP: 137/91   Pulse: 84   Resp: 16   Temp: 97.8 F (36.6 C)   TempSrc: Oral   SpO2: 99%   Weight:  190 lb (86.2 kg)  Height:  5\' 6"  (1.676 m)   Patient swab for GC and chlamydia. We'll treat him for possible tinea cruris with clotrimazole.  PROCEDURES    ED DIAGNOSES     ICD-9-CM ICD-10-CM   1. Concern about STD in male without diagnosis V65.5 Z71.1        Paula LibraJohn Earlyne Feeser, MD 01/22/16 [phone]

## 2016-01-22 NOTE — ED Triage Notes (Addendum)
Pt from home with complaints of itching in his groin x 2 weeks. Pt denies discharge, pain, or urinary symptoms. Pt states he has had unprotected sex with his ex-wife who has similar symptoms. Pt refused to get off his phone during triage

## 2016-01-24 LAB — GC/CHLAMYDIA PROBE AMP (~~LOC~~) NOT AT ARMC
CHLAMYDIA, DNA PROBE: NEGATIVE
Neisseria Gonorrhea: NEGATIVE

## 2016-03-02 ENCOUNTER — Emergency Department (HOSPITAL_COMMUNITY)
Admission: EM | Admit: 2016-03-02 | Discharge: 2016-03-02 | Disposition: A | Discharge status: Home / Self Care | Payer: Self-pay | Attending: Emergency Medicine | Admitting: Emergency Medicine

## 2016-03-02 ENCOUNTER — Encounter (HOSPITAL_COMMUNITY): Payer: Self-pay | Admitting: *Deleted

## 2016-03-02 DIAGNOSIS — F17210 Nicotine dependence, cigarettes, uncomplicated: Secondary | ICD-10-CM | POA: Insufficient documentation

## 2016-03-02 DIAGNOSIS — Z202 Contact with and (suspected) exposure to infections with a predominantly sexual mode of transmission: Secondary | ICD-10-CM | POA: Insufficient documentation

## 2016-03-02 DIAGNOSIS — H6123 Impacted cerumen, bilateral: Secondary | ICD-10-CM | POA: Insufficient documentation

## 2016-03-02 DIAGNOSIS — J45909 Unspecified asthma, uncomplicated: Secondary | ICD-10-CM | POA: Insufficient documentation

## 2016-03-02 LAB — URINALYSIS, ROUTINE W REFLEX MICROSCOPIC
Bilirubin Urine: NEGATIVE
Glucose, UA: NEGATIVE mg/dL
Hgb urine dipstick: NEGATIVE
Ketones, ur: NEGATIVE mg/dL
Leukocytes, UA: NEGATIVE
Nitrite: NEGATIVE
Protein, ur: NEGATIVE mg/dL
Specific Gravity, Urine: 1.018 (ref 1.005–1.030)
pH: 6 (ref 5.0–8.0)

## 2016-03-02 LAB — RPR: RPR Ser Ql: NONREACTIVE

## 2016-03-02 LAB — HIV ANTIBODY (ROUTINE TESTING W REFLEX): HIV Screen 4th Generation wRfx: NONREACTIVE

## 2016-03-02 MED ORDER — METRONIDAZOLE 500 MG PO TABS
2000.0000 mg | ORAL_TABLET | Freq: Once | ORAL | Status: AC
  Administered 2016-03-02: 2000 mg via ORAL
  Filled 2016-03-02: qty 4

## 2016-03-02 NOTE — ED Provider Notes (Signed)
[facility]-EMERGENCY DEPT Provider Note   CSN: [id number] Arrival date & time: 03/02/16  [id number]     History   Chief Complaint Chief Complaint  Patient presents with  . Ear Injury  . Penile Discharge    HPI Shawn DadaClarence R Raymond is a 35 y.o. male.  HPI   Shawn DadaClarence R Lapine is a 35 y.o. male, with a history of Asthma, presenting to the ED with left ear discomfort and muffled hearing beginning four days ago. Pt states he "might have stuck a qtip too deep." Patient also states that he has a known problem with excess earwax buildup. Patient states that his symptoms have begun to improve.  Secondarily, patient states he has had exposure to Trichomonas. Patient denies fever/chills, abdominal pain, penile discharge, dysuria, or any other complaints.  Past Medical History:  Diagnosis Date  . Asthma   . Sarcoidosis [facility](HCC)     Patient Active Problem List   Diagnosis Date Noted  . FRACTURE, ANKLE, RIGHT 08/09/2009  . ANKLE SPRAIN, RIGHT 08/09/2009  . COCAINE ABUSE 08/03/2009  . TOBACCO ABUSE 08/02/2009  . 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 INTRINSIC ASTHMA, UNSPECIFIED 03/23/2009  . Nonspecific (abnormal) findings on radiological and other examination of body structure 01/07/2009  . CT, CHEST, ABNORMAL 01/07/2009    Past Surgical History:  Procedure Laterality Date  . FEMUR FRACTURE SURGERY    . PATELLA RECONSTRUCTION         Home Medications    Prior to Admission medications   Medication Sig Start Date End Date Taking? Authorizing Provider  albuterol (PROVENTIL HFA;VENTOLIN HFA) 108 (90 Base) MCG/ACT inhaler Inhale 2 puffs into the lungs every 4 (four) hours as needed for wheezing or shortness of breath. 08/13/15  Yes Hayden Rasmussenavid Mabe, NP  budesonide-formoterol (SYMBICORT) 80-4.5 MCG/ACT inhaler Inhale 2 puffs into the lungs 2 (two) times daily.   Yes Historical Provider, MD    Family History No family history on file.  Social History Social History  Substance Use Topics  .  Smoking status: Current Every Day Smoker    Packs/day: 0.70    Types: Cigarettes  . Smokeless tobacco: Never Used  . Alcohol use No     Allergies   Shellfish allergy   Review of Systems Review of Systems  HENT: Positive for hearing loss (muffled hearing). Negative for ear discharge.   Genitourinary:       Exposure to Trichomonas.  All other systems reviewed and are negative.    Physical Exam Updated Vital Signs BP (!) 143/105 (BP Location: Right Arm)   Pulse 74   Temp 97.9 F (36.6 C) (Oral)   Resp 20   Ht 5\' 6"  (1.676 m)   Wt 88.5 kg   SpO2 99%   BMI 31.47 kg/m   Physical Exam  Constitutional: He appears well-developed and well-nourished. No distress.  HENT:  Head: Normocephalic and atraumatic.  Cerumen impaction bilaterally. TMs unable to be visualized. No inflammation noted.  Eyes: Conjunctivae are normal.  Neck: Neck supple.  Cardiovascular: Normal rate and regular rhythm.   Pulmonary/Chest: Effort normal. No respiratory distress.  Abdominal: Soft. There is no tenderness. There is no guarding.  Musculoskeletal: He exhibits no edema.  Lymphadenopathy:    He has no cervical adenopathy.  Neurological: He is alert.  Skin: Skin is warm and dry. He is not diaphoretic.  Psychiatric: He has a normal mood and affect. His behavior is normal.  Nursing note and vitals reviewed.    ED Treatments / Results  Labs (all  labs ordered are listed, but only abnormal results are displayed) Labs Reviewed  URINALYSIS, ROUTINE W REFLEX MICROSCOPIC (NOT [facility])  RPR  HIV ANTIBODY (ROUTINE TESTING)  GC/CHLAMYDIA PROBE AMP ([place]) NOT AT [facility]    EKG  EKG Interpretation None       Radiology No results found.  Procedures Procedures (including critical care time)  Medications Ordered in ED Medications  metroNIDAZOLE (FLAGYL) tablet 2,000 mg (2,000 mg Oral Given 03/02/16 [id number])     Initial Impression / Assessment and Plan / ED Course  I have reviewed the  triage vital signs and the nursing notes.  Pertinent labs & imaging results that were available during my care of the patient were reviewed by me and considered in my medical decision making (see chart for details).  Clinical Course     Patient presents with complaints of left ear discomfort and muffled hearing, as well as exposure to Trichomonas. Cerumen disimpaction here in the ED was suggested to the patient, who declined, stating that he would follow the instructions and perform this at home. Patient was treated for Trichomonas. STD testing pending. Return precautions discussed.     Final Clinical Impressions(s) / ED Diagnoses   Final diagnoses:  Bilateral impacted cerumen    New Prescriptions New Prescriptions   No medications on file     Anselm PancoastShawn C Enijah Furr, Cordelia Poche-C 03/02/16 0631    Glynn OctaveStephen Rancour, MD 03/02/16 [phone]

## 2016-03-02 NOTE — Discharge Instructions (Signed)
You have ear wax buildup in both ears. This is likely causing your symptoms today. Follow the instructions for earwax removal. Follow up with a primary care provider on this matter if symptoms continue.  You have been treated for trichomonas today. You have also been tested for other STDs. These results are still pending. Any abnormalities will be called to you. Be sure to follow safe sex practices, including monogamy and/or condom use.

## 2016-03-02 NOTE — ED Triage Notes (Signed)
Patient states he thinks he may have pushed a q tip to far into his left ear - sound is muffled.  Also states he has a penile discharge

## 2016-06-27 ENCOUNTER — Encounter (HOSPITAL_COMMUNITY): Payer: Self-pay | Admitting: Emergency Medicine

## 2016-06-27 ENCOUNTER — Emergency Department (HOSPITAL_COMMUNITY)
Admission: EM | Admit: 2016-06-27 | Discharge: 2016-06-27 | Disposition: A | Discharge status: Home / Self Care | Payer: Self-pay | Attending: Emergency Medicine | Admitting: Emergency Medicine

## 2016-06-27 DIAGNOSIS — Z202 Contact with and (suspected) exposure to infections with a predominantly sexual mode of transmission: Secondary | ICD-10-CM | POA: Insufficient documentation

## 2016-06-27 DIAGNOSIS — F17210 Nicotine dependence, cigarettes, uncomplicated: Secondary | ICD-10-CM | POA: Insufficient documentation

## 2016-06-27 DIAGNOSIS — J45909 Unspecified asthma, uncomplicated: Secondary | ICD-10-CM | POA: Insufficient documentation

## 2016-06-27 DIAGNOSIS — R300 Dysuria: Secondary | ICD-10-CM | POA: Insufficient documentation

## 2016-06-27 LAB — URINALYSIS, ROUTINE W REFLEX MICROSCOPIC
Bilirubin Urine: NEGATIVE
GLUCOSE, UA: NEGATIVE mg/dL
HGB URINE DIPSTICK: NEGATIVE
Ketones, ur: NEGATIVE mg/dL
Leukocytes, UA: NEGATIVE
Nitrite: NEGATIVE
PH: 5 (ref 5.0–8.0)
PROTEIN: NEGATIVE mg/dL
SPECIFIC GRAVITY, URINE: 1.023 (ref 1.005–1.030)

## 2016-06-27 LAB — HIV ANTIBODY (ROUTINE TESTING W REFLEX): HIV SCREEN 4TH GENERATION: NONREACTIVE

## 2016-06-27 LAB — GC/CHLAMYDIA PROBE AMP (~~LOC~~) NOT AT ARMC
Chlamydia: NEGATIVE
Neisseria Gonorrhea: NEGATIVE

## 2016-06-27 LAB — RPR: RPR: NONREACTIVE

## 2016-06-27 MED ORDER — LIDOCAINE HCL (PF) 1 % IJ SOLN
INTRAMUSCULAR | Status: AC
  Filled 2016-06-27: qty 5

## 2016-06-27 MED ORDER — ONDANSETRON 4 MG PO TBDP
4.0000 mg | ORAL_TABLET | Freq: Once | ORAL | Status: AC
  Administered 2016-06-27: 4 mg via ORAL
  Filled 2016-06-27: qty 1

## 2016-06-27 MED ORDER — CEFTRIAXONE SODIUM 250 MG IJ SOLR
250.0000 mg | Freq: Once | INTRAMUSCULAR | Status: AC
  Administered 2016-06-27: 250 mg via INTRAMUSCULAR
  Filled 2016-06-27: qty 250

## 2016-06-27 MED ORDER — METRONIDAZOLE 500 MG PO TABS
2000.0000 mg | ORAL_TABLET | Freq: Once | ORAL | Status: AC
  Administered 2016-06-27: 2000 mg via ORAL
  Filled 2016-06-27: qty 4

## 2016-06-27 MED ORDER — AZITHROMYCIN 250 MG PO TABS
1000.0000 mg | ORAL_TABLET | Freq: Once | ORAL | Status: AC
  Administered 2016-06-27: 1000 mg via ORAL
  Filled 2016-06-27 (×2): qty 4

## 2016-06-27 NOTE — ED Triage Notes (Signed)
Pt presents with painful urination and possible exposure to STD- girlfriend recently confided that she was pos for trich; pt denies discharge at this time

## 2016-06-27 NOTE — ED Notes (Signed)
Pt asked to wait ten minutes after given IM injection (to watch for reactions) however he left while RN was in another room.

## 2016-06-27 NOTE — ED Provider Notes (Signed)
[facility] DEPT Provider Note   CSN: [id number] Arrival date & time: 06/27/16  0046     History   Chief Complaint Chief Complaint  Patient presents with  . Exposure to STD    HPI TATEM FESLER is a 36 y.o. male who presents to the ED for dysuria. He reports that his male sex partner was diagnoses with trichomonas and he is requesting testing and treatment. He denies discharge from his penis or any other problems.   HPI  Past Medical History:  Diagnosis Date  . Asthma   . Sarcoidosis [facility])     Patient Active Problem List   Diagnosis Date Noted  . FRACTURE, ANKLE, RIGHT 08/09/2009  . ANKLE SPRAIN, RIGHT 08/09/2009  . COCAINE ABUSE 08/03/2009  . TOBACCO ABUSE 08/02/2009  . 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 INTRINSIC ASTHMA, UNSPECIFIED 03/23/2009  . Nonspecific (abnormal) findings on radiological and other examination of body structure 01/07/2009  . CT, CHEST, ABNORMAL 01/07/2009    Past Surgical History:  Procedure Laterality Date  . FEMUR FRACTURE SURGERY    . PATELLA RECONSTRUCTION         Home Medications    Prior to Admission medications   Medication Sig Start Date End Date Taking? Authorizing Provider  albuterol (PROVENTIL HFA;VENTOLIN HFA) 108 (90 Base) MCG/ACT inhaler Inhale 2 puffs into the lungs every 4 (four) hours as needed for wheezing or shortness of breath. 08/13/15   Hayden Rasmussen, NP  budesonide-formoterol (SYMBICORT) 80-4.5 MCG/ACT inhaler Inhale 2 puffs into the lungs 2 (two) times daily.    Historical Provider, MD    Family History History reviewed. No pertinent family history.  Social History Social History  Substance Use Topics  . Smoking status: Current Every Day Smoker    Packs/day: 0.70    Types: Cigarettes  . Smokeless tobacco: Never Used  . Alcohol use No     Allergies   Shellfish allergy   Review of Systems Review of Systems Negative except as stated in HPI  Physical Exam Updated Vital Signs BP 130/84  (BP Location: Left Arm)   Pulse 68   Temp 98.4 F (36.9 C) (Oral)   Resp 22   SpO2 99%   Physical Exam  Constitutional: He is oriented to person, place, and time. He appears well-developed and well-nourished. No distress.  HENT:  Head: Normocephalic.  Eyes: EOM are normal.  Neck: Neck supple.  Cardiovascular: Normal rate.   Pulmonary/Chest: Effort normal.  Abdominal: Soft. There is no tenderness.  Genitourinary: Testes normal. Circumcised. No penile erythema or penile tenderness. No discharge found.  Musculoskeletal: Normal range of motion.  Lymphadenopathy: No inguinal adenopathy noted on the right or left side.  Neurological: He is alert and oriented to person, place, and time. No cranial nerve deficit.  Skin: Skin is warm and dry.  Psychiatric: He has a normal mood and affect. His behavior is normal.  Nursing note and vitals reviewed.    ED Treatments / Results  Labs (all labs ordered are listed, but only abnormal results are displayed) Labs Reviewed  URINALYSIS, ROUTINE W REFLEX MICROSCOPIC  RPR  HIV ANTIBODY (ROUTINE TESTING)  GC/CHLAMYDIA PROBE AMP ([place]) NOT AT [facility]   Radiology No results found.  Procedures Procedures (including critical care time)  Medications Ordered in ED Medications  ondansetron (ZOFRAN-ODT) disintegrating tablet 4 mg (not administered)  cefTRIAXone (ROCEPHIN) injection 250 mg (not administered)  azithromycin (ZITHROMAX) tablet 1,000 mg (not administered)  metroNIDAZOLE (FLAGYL) tablet 2,000 mg (not administered)  Initial Impression / Assessment and Plan / ED Course  I have reviewed the triage vital signs and the nursing notes.  Final Clinical Impressions(s) / ED Diagnoses  36 y.o. male with exposure to trichomonas and possible other STI's stable for d/c without discharge and with normal urinalysis. Will treat for STI's with Rocephin and Zithromax and with Flagyl for the trich exposure. Discussed with the patient and all  questioned fully answered. He will f/u with the health department for future STD exposures or concerns.    Final diagnoses:  STD exposure  Dysuria    New Prescriptions New Prescriptions   No medications on file     [facility] M Sadaf Przybysz, NP 06/27/16 0210    Zadie Rhineonald Wickline, MD 06/27/16 [phone]

## 2016-11-09 ENCOUNTER — Emergency Department (HOSPITAL_COMMUNITY)
Admission: EM | Admit: 2016-11-09 | Discharge: 2016-11-09 | Disposition: A | Discharge status: Home / Self Care | Payer: Self-pay | Attending: Emergency Medicine | Admitting: Emergency Medicine

## 2016-11-09 ENCOUNTER — Encounter (HOSPITAL_COMMUNITY): Payer: Self-pay

## 2016-11-09 DIAGNOSIS — R369 Urethral discharge, unspecified: Secondary | ICD-10-CM

## 2016-11-09 DIAGNOSIS — R360 Urethral discharge without blood: Secondary | ICD-10-CM | POA: Insufficient documentation

## 2016-11-09 DIAGNOSIS — Z202 Contact with and (suspected) exposure to infections with a predominantly sexual mode of transmission: Secondary | ICD-10-CM | POA: Insufficient documentation

## 2016-11-09 DIAGNOSIS — J45909 Unspecified asthma, uncomplicated: Secondary | ICD-10-CM | POA: Insufficient documentation

## 2016-11-09 DIAGNOSIS — F17210 Nicotine dependence, cigarettes, uncomplicated: Secondary | ICD-10-CM | POA: Insufficient documentation

## 2016-11-09 DIAGNOSIS — R1030 Lower abdominal pain, unspecified: Secondary | ICD-10-CM | POA: Insufficient documentation

## 2016-11-09 LAB — COMPREHENSIVE METABOLIC PANEL
ALT: 28 U/L (ref 17–63)
ANION GAP: 10 (ref 5–15)
AST: 27 U/L (ref 15–41)
Albumin: 4.1 g/dL (ref 3.5–5.0)
Alkaline Phosphatase: 94 U/L (ref 38–126)
BILIRUBIN TOTAL: 0.7 mg/dL (ref 0.3–1.2)
BUN: 13 mg/dL (ref 6–20)
CO2: 28 mmol/L (ref 22–32)
Calcium: 9.2 mg/dL (ref 8.9–10.3)
Chloride: 99 mmol/L — ABNORMAL LOW (ref 101–111)
Creatinine, Ser: 1.32 mg/dL — ABNORMAL HIGH (ref 0.61–1.24)
Glucose, Bld: 135 mg/dL — ABNORMAL HIGH (ref 65–99)
POTASSIUM: 3.8 mmol/L (ref 3.5–5.1)
Sodium: 137 mmol/L (ref 135–145)
TOTAL PROTEIN: 7.4 g/dL (ref 6.5–8.1)

## 2016-11-09 LAB — URINALYSIS, ROUTINE W REFLEX MICROSCOPIC
Bilirubin Urine: NEGATIVE
Glucose, UA: NEGATIVE mg/dL
Hgb urine dipstick: NEGATIVE
KETONES UR: NEGATIVE mg/dL
LEUKOCYTES UA: NEGATIVE
NITRITE: NEGATIVE
PH: 6 (ref 5.0–8.0)
PROTEIN: NEGATIVE mg/dL
Specific Gravity, Urine: 1.012 (ref 1.005–1.030)

## 2016-11-09 LAB — CBC
HEMATOCRIT: 43.3 % (ref 39.0–52.0)
Hemoglobin: 14.1 g/dL (ref 13.0–17.0)
MCH: 29.4 pg (ref 26.0–34.0)
MCHC: 32.6 g/dL (ref 30.0–36.0)
MCV: 90.2 fL (ref 78.0–100.0)
Platelets: 223 10*3/uL (ref 150–400)
RBC: 4.8 MIL/uL (ref 4.22–5.81)
RDW: 13.8 % (ref 11.5–15.5)
WBC: 10.1 10*3/uL (ref 4.0–10.5)

## 2016-11-09 LAB — LIPASE, BLOOD: LIPASE: 30 U/L (ref 11–51)

## 2016-11-09 MED ORDER — AZITHROMYCIN 250 MG PO TABS
1000.0000 mg | ORAL_TABLET | Freq: Once | ORAL | Status: AC
  Administered 2016-11-09: 1000 mg via ORAL
  Filled 2016-11-09: qty 4

## 2016-11-09 MED ORDER — LIDOCAINE HCL (PF) 1 % IJ SOLN
INTRAMUSCULAR | Status: AC
  Administered 2016-11-09: 0.9 mL
  Filled 2016-11-09: qty 5

## 2016-11-09 MED ORDER — CEFTRIAXONE SODIUM 250 MG IJ SOLR
250.0000 mg | Freq: Once | INTRAMUSCULAR | Status: AC
  Administered 2016-11-09: 250 mg via INTRAMUSCULAR
  Filled 2016-11-09: qty 250

## 2016-11-09 NOTE — Discharge Instructions (Signed)
Return to the ED with any concerns including worsening abdominal pain, fever/chills, vomiting and not able to keep down liquids, testicular pain, decreased level of alertness/lethargy, or any other alarming symptoms

## 2016-11-09 NOTE — ED Provider Notes (Signed)
[facility] DEPT Provider Note   CSN: [id number] Arrival date & time: 11/09/16  0831     History   Chief Complaint Chief Complaint  Patient presents with  . Penile Discharge    HPI Shawn DadaClarence R Raymond is a 36 y.o. male.  HPI  Pt presenting with c/o penile discharge which he noted last night, but worse this morning.  He also c/o some burning with urination.  Also notes some right lower abdominal pain which started yesterday as well.  He states the penile discharge is his main concern.  No vomiting.  No fever.  Last BM was this morning and normal for him - without blood or mucous.  He does not know of any specific STD exposure- he states he has not seen his girlfriend for 3 weeks, but 2 days ago has been seeing her and now his symptoms have begun so he is concerned he may have an infection.  There are no other associated systemic symptoms, there are no other alleviating or modifying factors.  No testicular pain.   Past Medical History:  Diagnosis Date  . Asthma   . Sarcoidosis     Patient Active Problem List   Diagnosis Date Noted  . FRACTURE, ANKLE, RIGHT 08/09/2009  . ANKLE SPRAIN, RIGHT 08/09/2009  . COCAINE ABUSE 08/03/2009  . TOBACCO ABUSE 08/02/2009  . 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 INTRINSIC ASTHMA, UNSPECIFIED 03/23/2009  . Nonspecific (abnormal) findings on radiological and other examination of body structure 01/07/2009  . CT, CHEST, ABNORMAL 01/07/2009    Past Surgical History:  Procedure Laterality Date  . FEMUR FRACTURE SURGERY    . PATELLA RECONSTRUCTION         Home Medications    Prior to Admission medications   Medication Sig Start Date End Date Taking? Authorizing Provider  albuterol (PROVENTIL HFA;VENTOLIN HFA) 108 (90 Base) MCG/ACT inhaler Inhale 2 puffs into the lungs every 4 (four) hours as needed for wheezing or shortness of breath. 08/13/15  Yes Mabe, Onalee Huaavid, NP  budesonide-formoterol (SYMBICORT) 80-4.5 MCG/ACT inhaler Inhale 2 puffs  into the lungs 2 (two) times daily.   Yes [provider]  calcium carbonate (OSCAL) 1500 (600 Ca) MG TABS tablet Take 1,500 mg by mouth daily.   Yes [provider]  cholecalciferol (VITAMIN D) 1000 units tablet Take 1,000 Units by mouth daily.   Yes [provider]    Family History No family history on file.  Social History Social History  Substance Use Topics  . Smoking status: Current Every Day Smoker    Packs/day: 0.70    Types: Cigarettes  . Smokeless tobacco: Never Used  . Alcohol use No     Allergies   Shellfish allergy   Review of Systems Review of Systems  ROS reviewed and all otherwise negative except for mentioned in HPI   Physical Exam Updated Vital Signs BP 133/78 (BP Location: Right Arm)   Pulse 61   Temp (!) 97.5 F (36.4 C) (Oral)   Resp 18   Ht 5\' 6"  (1.676 m)   Wt 90.7 kg (200 lb)   SpO2 99%   BMI 32.28 kg/m  Vitals reviewed Physical Exam Physical Examination: General appearance - alert, well appearing, and in no distress Mental status - alert, oriented to person, place, and time Eyes - no conjunctival injection, no scleral icterus Mouth - mucous membranes moist, pharynx normal without lesions Neck - supple, no significant adenopathy Chest - clear to auscultation, no wheezes, rales or rhonchi, symmetric air  entry Heart - normal rate, regular rhythm, normal S1, S2, no murmurs, rubs, clicks or gallops Abdomen - soft, nontender, nondistended, no masses or organomegaly GU Male - no penile lesions or discharge, no testicular masses or tenderness, no hernias Neurological - alert, oriented, normal speech Extremities - peripheral pulses normal, no pedal edema, no clubbing or cyanosis Skin - normal coloration and turgor, no rashes  ED Treatments / Results  Labs (all labs ordered are listed, but only abnormal results are displayed) Labs Reviewed  COMPREHENSIVE METABOLIC PANEL - Abnormal; Notable for the following:        Result Value   Chloride 99 (*)    Glucose, Bld 135 (*)    Creatinine, Ser 1.32 (*)    All other components within normal limits  URINALYSIS, ROUTINE W REFLEX MICROSCOPIC - Abnormal; Notable for the following:    Color, Urine STRAW (*)    All other components within normal limits  LIPASE, BLOOD  CBC  RPR  HIV ANTIBODY (ROUTINE TESTING)  GC/CHLAMYDIA PROBE AMP ([place]) NOT AT [facility]    EKG  EKG Interpretation None       Radiology No results found.  Procedures Procedures (including critical care time)  Medications Ordered in ED Medications  cefTRIAXone (ROCEPHIN) injection 250 mg (250 mg Intramuscular Given 11/09/16 1043)  azithromycin (ZITHROMAX) tablet 1,000 mg (1,000 mg Oral Given 11/09/16 1042)  lidocaine (PF) (XYLOCAINE) 1 % injection (0.9 mLs  Given 11/09/16 1043)     Initial Impression / Assessment and Plan / ED Course  I have reviewed the triage vital signs and the nursing notes.  Pertinent labs & imaging results that were available during my care of the patient were reviewed by me and considered in my medical decision making (see chart for details).     Pt presenting with c/o penile discharge, urine is reassuring. Pt treated for urethritis- genprobe pending as well as other STD testing.  Abdomen is nontender, benign exam.  pts primar concern for visit was penile discharge.  Doubt acute intra abdominal process, labs reassuring as well.  Discharged with strict return precautions.  Pt agreeable with plan.  Final Clinical Impressions(s) / ED Diagnoses   Final diagnoses:  Penile discharge  Lower abdominal pain    New Prescriptions Discharge Medication List as of 11/09/2016 10:55 AM       Mabe, Latanya MaudlinMartha L, MD 11/09/16 1424

## 2016-11-09 NOTE — ED Triage Notes (Signed)
Per Pt, Pt is coming from home with complaints of penile discharge and lower abdominal discomfort that started yesterday along with burning with urination. Pt denies any N/V/D.

## 2016-11-09 NOTE — ED Notes (Signed)
Pt. Given crackers and peanut butter prior to antibiotic

## 2016-11-09 NOTE — ED Notes (Signed)
Sprite given 

## 2016-11-10 LAB — RPR: RPR Ser Ql: NONREACTIVE

## 2016-11-10 LAB — HIV ANTIBODY (ROUTINE TESTING W REFLEX): HIV SCREEN 4TH GENERATION: NONREACTIVE

## 2016-11-11 ENCOUNTER — Emergency Department (HOSPITAL_COMMUNITY)
Admission: EM | Admit: 2016-11-11 | Discharge: 2016-11-11 | Discharge status: Against Medical Advice | Payer: Self-pay | Attending: Emergency Medicine | Admitting: Emergency Medicine

## 2016-11-11 ENCOUNTER — Encounter (HOSPITAL_COMMUNITY): Payer: Self-pay

## 2016-11-11 DIAGNOSIS — F17210 Nicotine dependence, cigarettes, uncomplicated: Secondary | ICD-10-CM | POA: Insufficient documentation

## 2016-11-11 DIAGNOSIS — R1030 Lower abdominal pain, unspecified: Secondary | ICD-10-CM | POA: Insufficient documentation

## 2016-11-11 DIAGNOSIS — Z79899 Other long term (current) drug therapy: Secondary | ICD-10-CM | POA: Insufficient documentation

## 2016-11-11 DIAGNOSIS — J45909 Unspecified asthma, uncomplicated: Secondary | ICD-10-CM | POA: Insufficient documentation

## 2016-11-11 LAB — COMPREHENSIVE METABOLIC PANEL
ALT: 30 U/L (ref 17–63)
ANION GAP: 9 (ref 5–15)
AST: 25 U/L (ref 15–41)
Albumin: 4.2 g/dL (ref 3.5–5.0)
Alkaline Phosphatase: 86 U/L (ref 38–126)
BILIRUBIN TOTAL: 0.6 mg/dL (ref 0.3–1.2)
BUN: 19 mg/dL (ref 6–20)
CALCIUM: 9.1 mg/dL (ref 8.9–10.3)
CO2: 25 mmol/L (ref 22–32)
Chloride: 100 mmol/L — ABNORMAL LOW (ref 101–111)
Creatinine, Ser: 1.42 mg/dL — ABNORMAL HIGH (ref 0.61–1.24)
GFR calc Af Amer: >60 mL/min (ref >60)
Glucose, Bld: 94 mg/dL (ref 65–99)
POTASSIUM: 4.1 mmol/L (ref 3.5–5.1)
Sodium: 134 mmol/L — ABNORMAL LOW (ref 135–145)
TOTAL PROTEIN: 7.6 g/dL (ref 6.5–8.1)

## 2016-11-11 LAB — CBC
HEMATOCRIT: 43 % (ref 39.0–52.0)
Hemoglobin: 14.9 g/dL (ref 13.0–17.0)
MCH: 30.4 pg (ref 26.0–34.0)
MCHC: 34.7 g/dL (ref 30.0–36.0)
MCV: 87.8 fL (ref 78.0–100.0)
Platelets: 234 10*3/uL (ref 150–400)
RBC: 4.9 MIL/uL (ref 4.22–5.81)
RDW: 13.1 % (ref 11.5–15.5)
WBC: 12.5 10*3/uL — AB (ref 4.0–10.5)

## 2016-11-11 LAB — LIPASE, BLOOD: Lipase: 32 U/L (ref 11–51)

## 2016-11-11 NOTE — ED Notes (Addendum)
Pt asked how long the wait would be for upcoming tests upon RN entering room. Pt was told that we are unable to estimate wait times but assured pt we would do our best to accommodate him. Pt was informed RN would retrieve EDP for more info. Pt opted to leave room immediately thereafter. EDP notified.

## 2016-11-11 NOTE — ED Triage Notes (Signed)
Per Pt, Pt was seen here two days ago and treated for STD. Reports the symptoms have continued and he has had no relief. Was given a shot for treatment. Reports having no discharge medications.

## 2016-11-11 NOTE — ED Provider Notes (Signed)
[facility]PT Provider Note   CSN: [id number] Arrival date & time: 11/11/16  1401     History   Chief Complaint Chief Complaint  Patient presents with  . Abdominal Pain    HPI Shawn Raymond is a 36 y.o. male.  HPI Pt states he has been having pain in his lower abdomen and white penile discharge since Thursday.  He was seen in the ED and treated for an STD.  He does not feel like his symptoms have improved.  No fevers or vomiting.  He is having mild diarrhea.  Continues to have pain in the lower abdomen and dysuria, tingles, itches.   Pt has had trouble with discharge in the past.  He states he has always tested negative when seen in the ED. Past Medical History:  Diagnosis Date  . Asthma   . Sarcoidosis     Patient Active Problem List   Diagnosis Date Noted  . FRACTURE, ANKLE, RIGHT 08/09/2009  . ANKLE SPRAIN, RIGHT 08/09/2009  . COCAINE ABUSE 08/03/2009  . TOBACCO ABUSE 08/02/2009  . 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 INTRINSIC ASTHMA, UNSPECIFIED 03/23/2009  . Nonspecific (abnormal) findings on radiological and other examination of body structure 01/07/2009  . CT, CHEST, ABNORMAL 01/07/2009    Past Surgical History:  Procedure Laterality Date  . FEMUR FRACTURE SURGERY    . PATELLA RECONSTRUCTION         Home Medications    Prior to Admission medications   Medication Sig Start Date End Date Taking? Authorizing Provider  albuterol (PROVENTIL HFA;VENTOLIN HFA) 108 (90 Base) MCG/ACT inhaler Inhale 2 puffs into the lungs every 4 (four) hours as needed for wheezing or shortness of breath. 08/13/15   Hayden RasmussenMabe, David, NP  budesonide-formoterol (SYMBICORT) 80-4.5 MCG/ACT inhaler Inhale 2 puffs into the lungs 2 (two) times daily.    [provider]  calcium carbonate (OSCAL) 1500 (600 Ca) MG TABS tablet Take 1,500 mg by mouth daily.    [provider]  cholecalciferol (VITAMIN D) 1000 units tablet Take 1,000 Units by mouth daily.    [provider]    Family History No family history on file.  Social History Social History  Substance Use Topics  . Smoking status: Current Every Day Smoker    Packs/day: 0.70    Types: Cigarettes  . Smokeless tobacco: Never Used  . Alcohol use No     Allergies   Shellfish allergy   Review of Systems Review of Systems  All other systems reviewed and are negative.    Physical Exam Updated Vital Signs BP 130/78 (BP Location: Left Arm)   Pulse 82   Temp 98.3 F (36.8 C) (Oral)   Resp 16   Ht 1.676 m (5\' 6" )   Wt 90.7 kg (200 lb)   SpO2 96%   BMI 32.28 kg/m   Physical Exam  Constitutional: He appears well-developed and well-nourished. No distress.  HENT:  Head: Normocephalic and atraumatic.  Right Ear: External ear normal.  Left Ear: External ear normal.  Eyes: Conjunctivae are normal. Right eye exhibits no discharge. Left eye exhibits no discharge. No scleral icterus.  Neck: Neck supple. No tracheal deviation present.  Cardiovascular: Normal rate, regular rhythm and intact distal pulses.   Pulmonary/Chest: Effort normal and breath sounds normal. No stridor. No respiratory distress. He has no wheezes. He has no rales.  Abdominal: Soft. Bowel sounds are normal. He exhibits no distension. There is tenderness. There is no rebound and no guarding.  Suprapubic  region  Musculoskeletal: He exhibits no edema or tenderness.  Neurological: He is alert. He has normal strength. No cranial nerve deficit (no facial droop, extraocular movements intact, no slurred speech) or sensory deficit. He exhibits normal muscle tone. He displays no seizure activity. Coordination normal.  Skin: Skin is warm and dry. No rash noted.  Psychiatric: He has a normal mood and affect.  Nursing note and vitals reviewed.    ED Treatments / Results  Labs (all labs ordered are listed, but only abnormal results are displayed) Labs Reviewed  COMPREHENSIVE METABOLIC PANEL - Abnormal; Notable for the  following:       Result Value   Sodium 134 (*)    Chloride 100 (*)    Creatinine, Ser 1.42 (*)    All other components within normal limits  CBC - Abnormal; Notable for the following:    WBC 12.5 (*)    All other components within normal limits  LIPASE, BLOOD  URINALYSIS, ROUTINE W REFLEX MICROSCOPIC  [facility]    Procedures Procedures (including critical care time)  Medications Ordered in ED Medications - No data to display   Initial Impression / Assessment and Plan / ED Course  I have reviewed the triage vital signs and the nursing notes.  Pertinent labs & imaging results that were available during my care of the patient were reviewed by me and considered in my medical decision making (see chart for details).  Clinical Course as of Nov 11 1728  Sat Nov 11, 2016  1728 I discussed doing a CT scan for his persistent pain.  I also reviewed his lab results from the other day.  Pt requested a GC chlamydia swab since it was not done the other day.  He asked about trich and I said if the CT scan was negative we could consider empiric treatment for that.  Pt agreed to this plan.  After I left the room he told  the nurse he did not want to wait longer and left.  I was notified after he left  [JK]    Clinical Course User Index [JK] Linwood DibblesKnapp, Kaylib Furness, MD     Final Clinical Impressions(s) / ED Diagnoses   Final diagnoses:  Lower abdominal pain      Linwood DibblesKnapp, Braxston Quinter, MD 11/11/16 1731

## 2017-04-13 ENCOUNTER — Encounter (HOSPITAL_COMMUNITY): Payer: Self-pay | Admitting: Emergency Medicine

## 2017-04-13 ENCOUNTER — Other Ambulatory Visit: Payer: Self-pay

## 2017-04-13 ENCOUNTER — Emergency Department (HOSPITAL_COMMUNITY)
Admission: EM | Admit: 2017-04-13 | Discharge: 2017-04-13 | Disposition: A | Discharge status: Home / Self Care | Payer: No Typology Code available for payment source | Attending: Emergency Medicine | Admitting: Emergency Medicine

## 2017-04-13 DIAGNOSIS — M545 Low back pain: Secondary | ICD-10-CM | POA: Insufficient documentation

## 2017-04-13 DIAGNOSIS — Z5321 Procedure and treatment not carried out due to patient leaving prior to being seen by health care provider: Secondary | ICD-10-CM | POA: Insufficient documentation

## 2017-04-13 DIAGNOSIS — M542 Cervicalgia: Secondary | ICD-10-CM | POA: Diagnosis not present

## 2017-04-13 NOTE — ED Notes (Signed)
This RN found pt climbing out of bed trying to walk to bathroom to urinate. Pt had removed c-collar b/c he said it was uncomfortable. Instructed pt to return to bed, use urinal.

## 2017-04-13 NOTE — ED Notes (Signed)
Pt no where to be found, pt eloped
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
## 2017-05-13 ENCOUNTER — Ambulatory Visit (HOSPITAL_COMMUNITY)
Admission: EM | Admit: 2017-05-13 | Discharge: 2017-05-13 | Disposition: A | Discharge status: Home / Self Care | Payer: Self-pay | Attending: Family Medicine | Admitting: Family Medicine

## 2017-05-13 ENCOUNTER — Encounter (HOSPITAL_COMMUNITY): Payer: Self-pay | Admitting: Family Medicine

## 2017-05-13 DIAGNOSIS — Z202 Contact with and (suspected) exposure to infections with a predominantly sexual mode of transmission: Secondary | ICD-10-CM | POA: Insufficient documentation

## 2017-05-13 DIAGNOSIS — R369 Urethral discharge, unspecified: Secondary | ICD-10-CM | POA: Insufficient documentation

## 2017-05-13 DIAGNOSIS — Z113 Encounter for screening for infections with a predominantly sexual mode of transmission: Secondary | ICD-10-CM

## 2017-05-13 DIAGNOSIS — F17210 Nicotine dependence, cigarettes, uncomplicated: Secondary | ICD-10-CM | POA: Insufficient documentation

## 2017-05-13 DIAGNOSIS — J45909 Unspecified asthma, uncomplicated: Secondary | ICD-10-CM | POA: Insufficient documentation

## 2017-05-13 DIAGNOSIS — D869 Sarcoidosis, unspecified: Secondary | ICD-10-CM | POA: Insufficient documentation

## 2017-05-13 MED ORDER — CEFTRIAXONE SODIUM 250 MG IJ SOLR
250.0000 mg | Freq: Once | INTRAMUSCULAR | Status: AC
  Administered 2017-05-13: 250 mg via INTRAMUSCULAR

## 2017-05-13 MED ORDER — AZITHROMYCIN 250 MG PO TABS
ORAL_TABLET | ORAL | Status: AC
  Filled 2017-05-13: qty 4

## 2017-05-13 MED ORDER — AZITHROMYCIN 250 MG PO TABS
1000.0000 mg | ORAL_TABLET | Freq: Once | ORAL | Status: AC
  Administered 2017-05-13: 1000 mg via ORAL

## 2017-05-13 MED ORDER — CEFTRIAXONE SODIUM 250 MG IJ SOLR
INTRAMUSCULAR | Status: AC
  Filled 2017-05-13: qty 250

## 2017-05-13 MED ORDER — LIDOCAINE HCL (PF) 1 % IJ SOLN
INTRAMUSCULAR | Status: AC
  Filled 2017-05-13: qty 2

## 2017-05-13 NOTE — Discharge Instructions (Signed)
We should have the final results of which causing her problem in the next 24 hours.  Please abstain from any sexual activity for the next 5 days while the antibiotics work

## 2017-05-13 NOTE — ED Provider Notes (Signed)
[facility]   [id number] 05/13/17 Arrival Time: 1251   SUBJECTIVE:  Shawn Raymond is a 37 y.o. male who presents to the urgent care with complaint of STD exposure.  Notes two days of milky white discharge and itchy feeling.  No prior h/o STD  Past Medical History:  Diagnosis Date  . Asthma   . Sarcoidosis    No family history on file. Social History   Socioeconomic History  . Marital status: Divorced    Spouse name: Not on file  . Number of children: Not on file  . Years of education: Not on file  . Highest education level: Not on file  Social Needs  . Financial resource strain: Not on file  . Food insecurity - worry: Not on file  . Food insecurity - inability: Not on file  . Transportation needs - medical: Not on file  . Transportation needs - non-medical: Not on file  Occupational History  . Not on file  Tobacco Use  . Smoking status: Current Every Day Smoker    Packs/day: 0.70    Types: Cigarettes  . Smokeless tobacco: Never Used  Substance and Sexual Activity  . Alcohol use: No  . Drug use: No  . Sexual activity: Not on file  Other Topics Concern  . Not on file  Social History Narrative  . Not on file   No outpatient medications have been marked as taking for the 05/13/17 encounter [facility](Hospital Encounter).   Allergies  Allergen Reactions  . Shellfish Allergy Anaphylaxis      ROS: As per HPI, remainder of ROS negative.   OBJECTIVE:   Vitals:   05/13/17 1420  BP: 132/85  Pulse: 67  Resp: 20  Temp: 98.2 F (36.8 C)  TempSrc: Oral  SpO2: 97%     General appearance: alert; no distress Eyes: PERRL; EOMI; conjunctiva normal HENT: normocephalic; atraumatic;  normal Neck: supple Abdomen: soft, non-tender; bowel sounds normal; no masses or organomegaly; no guarding or rebound tenderness Genitalia:  Circumcised male with scant white discharge. Back: no CVA tenderness Extremities: no cyanosis or edema; symmetrical with no gross  deformities Skin: warm and dry Neurologic: normal gait; grossly normal Psychological: alert and cooperative; normal mood and affect      Labs:  Results for orders placed or performed during the hospital encounter of 11/11/16  Lipase, blood  Result Value Ref Range   Lipase 32 11 - 51 U/L  Comprehensive metabolic panel  Result Value Ref Range   Sodium 134 (L) 135 - 145 mmol/L   Potassium 4.1 3.5 - 5.1 mmol/L   Chloride 100 (L) 101 - 111 mmol/L   CO2 25 22 - 32 mmol/L   Glucose, Bld 94 65 - 99 mg/dL   BUN 19 6 - 20 mg/dL   Creatinine, Ser 4.091.42 (H) 0.61 - 1.24 mg/dL   Calcium 9.1 8.9 - 81.110.3 mg/dL   Total Protein 7.6 6.5 - 8.1 g/dL   Albumin 4.2 3.5 - 5.0 g/dL   AST 25 15 - 41 U/L   ALT 30 17 - 63 U/L   Alkaline Phosphatase 86 38 - 126 U/L   Total Bilirubin 0.6 0.3 - 1.2 mg/dL   GFR calc non Af Amer >60 >60 mL/min   GFR calc Af Amer >60 >60 mL/min   Anion gap 9 5 - 15  CBC  Result Value Ref Range   WBC 12.5 (H) 4.0 - 10.5 K/uL   RBC 4.90 4.22 - 5.81 MIL/uL  Hemoglobin 14.9 13.0 - 17.0 g/dL   HCT 40.9 81.1 - 91.4 %   MCV 87.8 78.0 - 100.0 fL   MCH 30.4 26.0 - 34.0 pg   MCHC 34.7 30.0 - 36.0 g/dL   RDW 78.2 95.6 - 21.3 %   Platelets 234 150 - 400 K/uL    Labs Reviewed  URINE CYTOLOGY ANCILLARY ONLY    No results found.     ASSESSMENT & PLAN:  1. Penile discharge     Meds ordered this encounter  Medications  . cefTRIAXone (ROCEPHIN) injection 250 mg  . azithromycin (ZITHROMAX) tablet 1,000 mg    Reviewed expectations re: course of current medical issues. Questions answered. Outlined signs and symptoms indicating need for more acute intervention. Patient verbalized understanding. After Visit Summary given.    Procedures:      Elvina Sidle, MD 05/13/17 1431

## 2017-05-13 NOTE — ED Triage Notes (Signed)
PT reports penile itching and burning for 2 days. PT being seen by Dr. Ena DawleyL:auenstein at this time.

## 2017-05-14 LAB — URINE CYTOLOGY ANCILLARY ONLY
Chlamydia: NEGATIVE
Neisseria Gonorrhea: NEGATIVE
Trichomonas: NEGATIVE

## 2017-07-16 DIAGNOSIS — R200 Anesthesia of skin: Secondary | ICD-10-CM | POA: Insufficient documentation

## 2017-07-19 ENCOUNTER — Encounter (HOSPITAL_COMMUNITY): Payer: Self-pay | Admitting: Emergency Medicine

## 2017-07-19 ENCOUNTER — Ambulatory Visit (HOSPITAL_COMMUNITY)
Admission: EM | Admit: 2017-07-19 | Discharge: 2017-07-19 | Disposition: A | Discharge status: Home / Self Care | Payer: Self-pay | Attending: Family Medicine | Admitting: Family Medicine

## 2017-07-19 DIAGNOSIS — R202 Paresthesia of skin: Secondary | ICD-10-CM

## 2017-07-19 MED ORDER — PREDNISONE 10 MG (48) PO TBPK: ORAL_TABLET | ORAL | 0 refills | Status: DC

## 2017-07-19 MED ORDER — DICLOFENAC SODIUM 75 MG PO TBEC: 75.0000 mg | DELAYED_RELEASE_TABLET | Freq: Two times a day (BID) | ORAL | 0 refills | Status: DC

## 2017-07-19 NOTE — ED Triage Notes (Signed)
Pt c/o back pain x1 month, states his hands are numb. No definitive injury, just progressively getting worse. Pt lifting weight, very active person.

## 2017-07-19 NOTE — Discharge Instructions (Signed)
If medicines prescribed today do not help you may need to see an orthopaedist for further evaluation.

## 2017-07-19 NOTE — ED Provider Notes (Signed)
[facility]   [id number] 07/19/17 Arrival Time: 1001  ASSESSMENT & PLAN:  1. Paresthesia of left arm   2. Paresthesia of right arm   Question disk problem. Discussed.  Meds ordered this encounter  Medications  . diclofenac (VOLTAREN) 75 MG EC tablet    Sig: Take 1 tablet (75 mg total) by mouth 2 (two) times daily.    Dispense:  14 tablet    Refill:  0  . predniSONE (STERAPRED UNI-PAK 48 TAB) 10 MG (48) TBPK tablet    Sig: Take as directed.    Dispense:  48 tablet    Refill:  0   Recommend orthopaedic f/u if not improving with medications above. May f/u here as needed.  Reviewed expectations re: course of current medical issues. Questions answered. Outlined signs and symptoms indicating need for more acute intervention. Patient verbalized understanding. After Visit Summary given.  SUBJECTIVE: History from: patient. Shawn Raymond is a 37 y.o. male who reports intermittent "numbness and tingling of my hands and arms" over the past 3-4 weeks. No injury reported. Was doing a lot of weightlifting prior to symptom onset. Feels episodes are increasing in frequency. No specific aggravating or alleviating factors reported. OTC ibuprofen without much relief; taking prn. No h/o neck or back injury/trauma. Reports normal strength. Associated symptoms: none reported.  ROS: As per HPI.   OBJECTIVE:  Vitals:   07/19/17 1028  BP: (!) 122/51  Pulse: 73  Resp: 18  Temp: 98 F (36.7 C)  SpO2: 100%    General appearance: alert; no distress Neck: FROM; poorly localized tenderness over lower neck and upper back Extremities: no cyanosis or edema; symmetrical with no gross deformities CV: normal extremity capillary refill Skin: warm and dry Neurologic: normal gait; normal symmetric reflexes in all extremities; reports decreased sensation over bilateral forearms and hands Psychological: alert and cooperative; normal mood and affect  Allergies  Allergen Reactions  .  Shellfish Allergy Anaphylaxis    Past Medical History:  Diagnosis Date  . Asthma   . Sarcoidosis    Social History   Socioeconomic History  . Marital status: Divorced    Spouse name: Not on file  . Number of children: Not on file  . Years of education: Not on file  . Highest education level: Not on file  Occupational History  . Not on file  Social Needs  . Financial resource strain: Not on file  . Food insecurity:    Worry: Not on file    Inability: Not on file  . Transportation needs:    Medical: Not on file    Non-medical: Not on file  Tobacco Use  . Smoking status: Current Every Day Smoker    Packs/day: 0.70    Types: Cigarettes  . Smokeless tobacco: Never Used  Substance and Sexual Activity  . Alcohol use: No  . Drug use: No  . Sexual activity: Not on file  Lifestyle  . Physical activity:    Days per week: Not on file    Minutes per session: Not on file  . Stress: Not on file  Relationships  . Social connections:    Talks on phone: Not on file    Gets together: Not on file    Attends religious service: Not on file    Active member of club or organization: Not on file    Attends meetings of clubs or organizations: Not on file    Relationship status: Not on file  . Intimate partner violence:  Fear of current or ex partner: Not on file    Emotionally abused: Not on file    Physically abused: Not on file    Forced sexual activity: Not on file  Other Topics Concern  . Not on file  Social History Narrative  . Not on file    Past Surgical History:  Procedure Laterality Date  . FEMUR FRACTURE SURGERY    . PATELLA RECONSTRUCTION        Mardella LaymanHagler, Chaselyn Nanney, MD 07/19/17 [phone]

## 2017-07-31 ENCOUNTER — Emergency Department (HOSPITAL_COMMUNITY): Payer: Self-pay

## 2017-07-31 ENCOUNTER — Emergency Department (HOSPITAL_COMMUNITY)
Admission: EM | Admit: 2017-07-31 | Discharge: 2017-07-31 | Disposition: A | Discharge status: Home / Self Care | Payer: Self-pay | Attending: Emergency Medicine | Admitting: Emergency Medicine

## 2017-07-31 ENCOUNTER — Encounter (HOSPITAL_COMMUNITY): Payer: Self-pay | Admitting: Emergency Medicine

## 2017-07-31 DIAGNOSIS — R531 Weakness: Secondary | ICD-10-CM | POA: Insufficient documentation

## 2017-07-31 DIAGNOSIS — M542 Cervicalgia: Secondary | ICD-10-CM | POA: Insufficient documentation

## 2017-07-31 LAB — BASIC METABOLIC PANEL
Anion gap: 10 (ref 5–15)
BUN: 17 mg/dL (ref 6–20)
CALCIUM: 9.3 mg/dL (ref 8.9–10.3)
CO2: 25 mmol/L (ref 22–32)
CREATININE: 1.42 mg/dL — AB (ref 0.61–1.24)
Chloride: 100 mmol/L — ABNORMAL LOW (ref 101–111)
GFR calc Af Amer: >60 mL/min (ref >60)
GLUCOSE: 105 mg/dL — AB (ref 65–99)
POTASSIUM: 4.7 mmol/L (ref 3.5–5.1)
SODIUM: 135 mmol/L (ref 135–145)

## 2017-07-31 LAB — CBC WITH DIFFERENTIAL/PLATELET
Basophils Absolute: 0 10*3/uL (ref 0.0–0.1)
Basophils Relative: 0 %
EOS ABS: 0.1 10*3/uL (ref 0.0–0.7)
EOS PCT: 1 %
HCT: 43.8 % (ref 39.0–52.0)
Hemoglobin: 14.9 g/dL (ref 13.0–17.0)
Lymphocytes Relative: 21 %
Lymphs Abs: 1.7 10*3/uL (ref 0.7–4.0)
MCH: 30.3 pg (ref 26.0–34.0)
MCHC: 34 g/dL (ref 30.0–36.0)
MCV: 89.2 fL (ref 78.0–100.0)
MONO ABS: 0.4 10*3/uL (ref 0.1–1.0)
MONOS PCT: 5 %
Neutro Abs: 6.1 10*3/uL (ref 1.7–7.7)
Neutrophils Relative %: 73 %
PLATELETS: 218 10*3/uL (ref 150–400)
RBC: 4.91 MIL/uL (ref 4.22–5.81)
RDW: 13.3 % (ref 11.5–15.5)
WBC: 8.3 10*3/uL (ref 4.0–10.5)

## 2017-07-31 NOTE — ED Provider Notes (Signed)
Patient placed in Quick Look pathway, seen and evaluated   Chief Complaint: b/l hand paresthesias and neck pain, and b/l leg weakness and R hand weakness  HPI:   Patient is a 37 year old male with a history of pulmonary sarcoidosis presenting today with complaints of 3-4 weeks of neck pain, bilateral hand numbness/paresthesias, bilateral leg weakness, and right hand weakness.  He states that he was working out doing "a Furniture conservator/restorermilitary press" and started having neck pain, and then progressively started having the other symptoms.  He states that his paresthesias/numbness are in the fourth and fifth fingertips of the left hand in the fourth and fifth digits and palm of the right hand.  He reports that his right hand feels very weak, and his legs are starting to feel weaker as well.  He denies any incontinence of urine or stool, saddle anesthesia or cauda equina symptoms, or any fevers, or any other complaints at this time.  He was seen on 07/19/17 at the urgent care and was prescribed prednisone and diclofenac which have not helped.  ROS: +neck pain, +b/l hand paresthesias/numbness, +b/l leg weakness, +R hand weakness, no incontinence/cauda equina symptoms  Physical Exam:  BP (!) 141/82 (BP Location: Left Arm)   Pulse 81   Temp 98.5 F (36.9 C)   Resp 16   SpO2 100%    Gen: No distress  Neuro: Awake and Alert  Skin: Warm    Focused Exam: Strength grossly intact in all extremities with all muscle group testing of the upper extremities, and grossly intact with quad and calf muscle group testing in the lower extremities.  Sensation is grossly intact.  Distal pulses intact.  Mild left paracervical muscle tenderness to palpation but no focal midline tenderness, no bony step-offs or deformities, slight spasm palpable.   Initiation of care has begun. The patient has been counseled on the process, plan, and necessity for staying for the completion/evaluation, and the remainder of the medical screening examination      [address], [place]cedes, [place]-C 07/31/17 1231    Gerhard MunchLockwood, Robert, MD 08/02/17 [phone]

## 2017-07-31 NOTE — ED Notes (Signed)
Pt reported to have left by previous tech. No reply x3. Pt not seen in lobby.

## 2017-07-31 NOTE — ED Triage Notes (Signed)
Patient was exercising 3-4 weeks ago when he felt a tearing sensation at the base of his neck, since then he has experienced progressively worsening weakness, numbness,and tingling in his right arm and bilateral legs.

## 2017-07-31 NOTE — ED Notes (Signed)
Pt seen leaving  

## 2017-08-01 ENCOUNTER — Encounter (HOSPITAL_COMMUNITY): Payer: Self-pay

## 2017-08-01 ENCOUNTER — Other Ambulatory Visit: Payer: Self-pay

## 2017-08-01 ENCOUNTER — Emergency Department (HOSPITAL_COMMUNITY)
Admission: EM | Admit: 2017-08-01 | Discharge: 2017-08-01 | Disposition: A | Discharge status: Home / Self Care | Payer: Self-pay | Attending: Emergency Medicine | Admitting: Emergency Medicine

## 2017-08-01 DIAGNOSIS — F17210 Nicotine dependence, cigarettes, uncomplicated: Secondary | ICD-10-CM | POA: Insufficient documentation

## 2017-08-01 DIAGNOSIS — M5410 Radiculopathy, site unspecified: Secondary | ICD-10-CM

## 2017-08-01 DIAGNOSIS — J45909 Unspecified asthma, uncomplicated: Secondary | ICD-10-CM | POA: Insufficient documentation

## 2017-08-01 DIAGNOSIS — M5412 Radiculopathy, cervical region: Secondary | ICD-10-CM | POA: Insufficient documentation

## 2017-08-01 MED ORDER — MENTHOL (TOPICAL ANALGESIC) 7.5 % EX CREA: 1.0000 "application " | TOPICAL_CREAM | Freq: Two times a day (BID) | CUTANEOUS | 0 refills | Status: DC | PRN

## 2017-08-01 MED ORDER — PREDNISONE 10 MG PO TABS: 40.0000 mg | ORAL_TABLET | Freq: Every day | ORAL | 0 refills | Status: AC

## 2017-08-01 MED ORDER — METHOCARBAMOL 500 MG PO TABS: 500.0000 mg | ORAL_TABLET | Freq: Every evening | ORAL | 0 refills | Status: DC | PRN

## 2017-08-01 MED ORDER — PREDNISONE 20 MG PO TABS
60.0000 mg | ORAL_TABLET | Freq: Once | ORAL | Status: AC
  Administered 2017-08-01: 60 mg via ORAL
  Filled 2017-08-01: qty 3

## 2017-08-01 NOTE — ED Provider Notes (Signed)
[facility] Provider Note   CSN: [id number] Arrival date & time: 08/01/17  0855     History   Chief Complaint Chief Complaint  Patient presents with  . Follow-up    HPI Shawn Raymond is a 37 y.o. male with past medical history of asthma and sarcoidosis presenting with 1 month of progressing neck pain and upper extremity paresthesia on the right.  Also reports feeling weak in his lower extremities bilaterally. Patient states that while he was incarcerated 7 years ago he had been evaluated by neurology and he feels like his "nervous system has been out of wack" since.  He was in the emergency department yesterday and labs and imaging were ordered but he left without being seen. He had to leave to pick up a child. CT cervical and blood work was ordered. He is returning for his results.  Patient denies any lower back pain. No fever, chills, weight loss, night sweats, history of IV drug use, saddle paresthesia, loss of bowel bladder function.  HPI  Past Medical History:  Diagnosis Date  . Asthma   . Sarcoidosis     Patient Active Problem List   Diagnosis Date Noted  . FRACTURE, ANKLE, RIGHT 08/09/2009  . ANKLE SPRAIN, RIGHT 08/09/2009  . COCAINE ABUSE 08/03/2009  . TOBACCO ABUSE 08/02/2009  . 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 INTRINSIC ASTHMA, UNSPECIFIED 03/23/2009  . Nonspecific (abnormal) findings on radiological and other examination of body structure 01/07/2009  . CT, CHEST, ABNORMAL 01/07/2009    Past Surgical History:  Procedure Laterality Date  . FEMUR FRACTURE SURGERY    . PATELLA RECONSTRUCTION          Home Medications    Prior to Admission medications   Medication Sig Start Date End Date Taking? Authorizing Provider  budesonide-formoterol (SYMBICORT) 80-4.5 MCG/ACT inhaler Inhale 2 puffs into the lungs 2 (two) times daily.   Yes [provider]  albuterol (PROVENTIL HFA;VENTOLIN HFA) 108 (90 Base)  MCG/ACT inhaler Inhale 2 puffs into the lungs every 4 (four) hours as needed for wheezing or shortness of breath. 08/13/15   Hayden RasmussenMabe, David, NP  diclofenac (VOLTAREN) 75 MG EC tablet Take 1 tablet (75 mg total) by mouth 2 (two) times daily. Patient not taking: Reported on 08/01/2017 07/19/17   Mardella LaymanHagler, Brian, MD  Menthol, Topical Analgesic, (ICY HOT NATURALS) 7.5 % CREA Apply 1 application topically 2 (two) times daily as needed. 08/01/17   Georgiana ShoreMitchell, Katharine Rochefort B, PA-C  methocarbamol (ROBAXIN) 500 MG tablet Take 1 tablet (500 mg total) by mouth at bedtime as needed. 08/01/17   Georgiana ShoreMitchell, Madelline Eshbach B, PA-C  predniSONE (DELTASONE) 10 MG tablet Take 4 tablets (40 mg total) by mouth daily for 4 days. 08/01/17 08/05/17  Georgiana ShoreMitchell, Chelbie Jarnagin B, PA-C    Family History No family history on file.  Social History Social History   Tobacco Use  . Smoking status: Current Every Day Smoker    Packs/day: 0.70    Types: Cigarettes  . Smokeless tobacco: Never Used  Substance Use Topics  . Alcohol use: No  . Drug use: No     Allergies   Shellfish allergy   Review of Systems Review of Systems  Constitutional: Negative for chills, diaphoresis, fatigue, fever and unexpected weight change.  Respiratory: Negative for cough, shortness of breath and stridor.   Cardiovascular: Negative for chest pain, palpitations and leg swelling.  Gastrointestinal: Negative for abdominal pain, nausea and vomiting.  Genitourinary: Negative for decreased urine volume, difficulty  urinating, dysuria, hematuria and urgency.  Musculoskeletal: Positive for myalgias and neck pain. Negative for arthralgias, back pain, gait problem and joint swelling.  Skin: Negative for color change, pallor and rash.  Neurological: Positive for weakness and numbness. Negative for dizziness, tremors, seizures, syncope, facial asymmetry, speech difficulty, light-headedness and headaches.     Physical Exam Updated Vital Signs BP (!) 133/95 (BP Location: Left Arm)    Pulse 66   Temp 98.3 F (36.8 C) (Oral)   Resp 18   Ht 5\' 6"  (1.676 m)   Wt 90.7 kg (200 lb)   SpO2 99%   BMI 32.28 kg/m   Physical Exam  Constitutional: He is oriented to person, place, and time. He appears well-developed and well-nourished. No distress.  Afebrile, nontoxic-appearing, sitting comfortably in chair in no acute distress.  HENT:  Head: Normocephalic and atraumatic.  Eyes: Conjunctivae and EOM are normal. Right eye exhibits no discharge. Left eye exhibits no discharge.  Neck: Normal range of motion. Neck supple.  No midline tenderness  Cardiovascular: Normal rate and intact distal pulses.  Pulmonary/Chest: Effort normal. No respiratory distress.  Musculoskeletal: Normal range of motion. He exhibits tenderness. He exhibits no edema.  No midline tenderness palpation of the spine.  Tenderness to palpation of the trapezius muscle bilaterally worse on the right  Neurological: He is alert and oriented to person, place, and time. He displays normal reflexes. A sensory deficit is present. No cranial nerve deficit. He exhibits normal muscle tone. Coordination normal.  5/5 strength in lower extremity bilaterally. Normal stance and gait. 5/5 strength left upper extremity grip 4/5 strength in right upper extremity grip 5/5 shoulder shrug, abduction/adduction, elbow flexion/extension bilaterally. Hyperreflexia of patellar reflex. No clonus. Decreased sensation to light touch on the right upper extremity.  Skin: Skin is warm and dry. No rash noted. He is not diaphoretic. No erythema. No pallor.  Psychiatric: He has a normal mood and affect.  Nursing note and vitals reviewed.    ED Treatments / Results  Labs (all labs ordered are listed, but only abnormal results are displayed) Labs Reviewed - No data to display  EKG None  Radiology Ct Cervical Spine Wo Contrast  Result Date: 07/31/2017 CLINICAL DATA:  Neck pain. Worsening weakness, numbness, and tingling in the right  arm and both legs for 3-4 weeks following exercise. EXAM: CT CERVICAL SPINE WITHOUT CONTRAST TECHNIQUE: Multidetector CT imaging of the cervical spine was performed without intravenous contrast. Multiplanar CT image reconstructions were also generated. COMPARISON:  12/21/2008 FINDINGS: Alignment: Normal. Skull base and vertebrae: No fracture or suspicious osseous lesion. Chronic benign appearing lucency in the posterior midline of the T1 vertebral body. Soft tissues and spinal canal: No prevertebral fluid or swelling. No visible canal hematoma. Disc levels: C2-3: Partially calcified central disc protrusion may contact the spinal cord and results in borderline spinal stenosis, stable to minimally enlarged from the prior study. No neural foraminal stenosis. C3-4: Partially calcified central disc protrusion results in mild spinal stenosis, unchanged. No neural foraminal stenosis. C4-5: Mild disc space narrowing. Disc bulging and asymmetric right uncovertebral spurring result in mild spinal stenosis and moderate right neural foraminal stenosis, progressed from prior. C5-6: Mild disc space narrowing. Disc bulging and uncovertebral spurring result in new mild right neural foraminal stenosis and likely mild spinal stenosis. C6-7: Mild disc space narrowing. Disc bulging and mild uncovertebral spurring result in new, likely mild spinal stenosis and no significant neural foraminal stenosis. C7-T1: Mild disc space narrowing and mild spondylosis. Suboptimally visualized  spinal canal without gross stenosis. Upper chest: Mild left apical lung scarring. Other: None. IMPRESSION: 1. No acute osseous abnormality identified in the cervical spine. 2. Progressive cervical disc degeneration resulting in likely mild multilevel spinal stenosis as well as multilevel neural foraminal stenosis, moderate on the right at C4-5. Electronically Signed   By: Sebastian Ache M.D.   On: 07/31/2017 12:54    Procedures Procedures (including critical  care time)  Medications Ordered in ED Medications  predniSONE (DELTASONE) tablet 60 mg (60 mg Oral Given 08/01/17 1226)     Initial Impression / Assessment and Plan / ED Course  I have reviewed the triage vital signs and the nursing notes.  Pertinent labs & imaging results that were available during my care of the patient were reviewed by me and considered in my medical decision making (see chart for details).     Patient presents with neck pain with movement and paresthesia of the right hand. No injury or trauma. No midline ttp. Tender to palpation of the trapezius muscle bilaterally, worse on the right.  Reflexes intact in upper and lower extremities bilaterally. Hyperreflexia of patellar reflex which patient reports is chronic and he has been told this by a neurologist in the past when evaluated 5-7 years ago.  No clonus.  No fever, chills, iv drug use, loss of bowel or bladder function. Patient CT shows progression of cervical degenerative disease with multilevel spinal stenosis and foraminal stenosis.  Patient was given prednisone while in the emergency department and will be discharged with a short burst with close follow-up with neurosurgery and PCP. Discharge home with symptomatic relief and close follow-up.  Urged patient to make an appointment after leaving the department today.  Discussed return precautions and patient understood and agreed with discharge plan  Final Clinical Impressions(s) / ED Diagnoses   Final diagnoses:  Radiculopathy affecting upper extremity    ED Discharge Orders        Ordered    methocarbamol (ROBAXIN) 500 MG tablet  At bedtime PRN     08/01/17 1205    predniSONE (DELTASONE) 10 MG tablet  Daily     08/01/17 1205    Menthol, Topical Analgesic, (ICY HOT NATURALS) 7.5 % CREA  2 times daily PRN     08/01/17 1206       Georgiana Shore, PA-C 08/01/17 1242    Cardama, Amadeo Garnet, MD 08/02/17 1116

## 2017-08-01 NOTE — ED Triage Notes (Signed)
Pt states that he was seen here yesterday for neck and back pain, had blood work and a CT scan but had to leave to go pick up family and is back today to obtain results.

## 2017-08-01 NOTE — Discharge Instructions (Addendum)
As discussed, the medication prescribed can help with muscle spasm but cannot be taken if operating machinery, driving, working or with alcohol.  Take at nighttime as needed.  Prednisone for the next 4 days starting tomorrow.  Follow up with Neurosurgery and PCP.  Return if symptoms worsen or new concerning symptoms in the meantime.

## 2017-08-01 NOTE — ED Notes (Signed)
States neck pain began 3-4 weeks ago, and tingling has begun in arms and legs. Was here yesterday and would like the results of his tests explained to him.

## 2017-08-20 ENCOUNTER — Other Ambulatory Visit (HOSPITAL_COMMUNITY): Payer: Self-pay | Admitting: Physician Assistant

## 2017-08-20 DIAGNOSIS — M542 Cervicalgia: Secondary | ICD-10-CM

## 2017-08-25 ENCOUNTER — Ambulatory Visit (HOSPITAL_COMMUNITY)
Admission: RE | Admit: 2017-08-25 | Discharge: 2017-08-25 | Disposition: A | Discharge status: Home / Self Care | Payer: Self-pay | Source: Ambulatory Visit | Attending: Physician Assistant | Admitting: Physician Assistant

## 2017-08-25 DIAGNOSIS — M4802 Spinal stenosis, cervical region: Secondary | ICD-10-CM | POA: Insufficient documentation

## 2017-08-25 DIAGNOSIS — M50223 Other cervical disc displacement at C6-C7 level: Secondary | ICD-10-CM | POA: Insufficient documentation

## 2017-08-25 DIAGNOSIS — M542 Cervicalgia: Secondary | ICD-10-CM | POA: Insufficient documentation

## 2017-08-29 ENCOUNTER — Encounter (HOSPITAL_COMMUNITY): Payer: Self-pay

## 2017-08-29 ENCOUNTER — Emergency Department (HOSPITAL_COMMUNITY)
Admission: EM | Admit: 2017-08-29 | Discharge: 2017-08-30 | Disposition: A | Discharge status: Home / Self Care | Payer: Self-pay | Attending: Emergency Medicine | Admitting: Emergency Medicine

## 2017-08-29 ENCOUNTER — Encounter (INDEPENDENT_AMBULATORY_CARE_PROVIDER_SITE_OTHER): Payer: Self-pay | Admitting: Nurse Practitioner

## 2017-08-29 ENCOUNTER — Other Ambulatory Visit: Payer: Self-pay

## 2017-08-29 ENCOUNTER — Ambulatory Visit (INDEPENDENT_AMBULATORY_CARE_PROVIDER_SITE_OTHER): Payer: Self-pay | Admitting: Nurse Practitioner

## 2017-08-29 VITALS — BP 146/99 | HR 60 | Temp 98.0°F | Ht 66.0 in | Wt 207.6 lb

## 2017-08-29 DIAGNOSIS — F419 Anxiety disorder, unspecified: Secondary | ICD-10-CM

## 2017-08-29 DIAGNOSIS — R3981 Functional urinary incontinence: Secondary | ICD-10-CM | POA: Insufficient documentation

## 2017-08-29 DIAGNOSIS — F32A Depression, unspecified: Secondary | ICD-10-CM

## 2017-08-29 DIAGNOSIS — M5412 Radiculopathy, cervical region: Secondary | ICD-10-CM

## 2017-08-29 DIAGNOSIS — F329 Major depressive disorder, single episode, unspecified: Secondary | ICD-10-CM

## 2017-08-29 DIAGNOSIS — Z5321 Procedure and treatment not carried out due to patient leaving prior to being seen by health care provider: Secondary | ICD-10-CM | POA: Insufficient documentation

## 2017-08-29 DIAGNOSIS — J45909 Unspecified asthma, uncomplicated: Secondary | ICD-10-CM

## 2017-08-29 MED ORDER — METHOCARBAMOL 500 MG PO TABS: 500.0000 mg | ORAL_TABLET | Freq: Three times a day (TID) | ORAL | 3 refills | Status: DC

## 2017-08-29 MED ORDER — DULOXETINE HCL 60 MG PO CPEP: 60.0000 mg | ORAL_CAPSULE | Freq: Every day | ORAL | 1 refills | Status: DC

## 2017-08-29 MED ORDER — ALBUTEROL SULFATE HFA 108 (90 BASE) MCG/ACT IN AERS: 2.0000 | INHALATION_SPRAY | RESPIRATORY_TRACT | 1 refills | Status: DC | PRN

## 2017-08-29 MED ORDER — BUDESONIDE-FORMOTEROL FUMARATE 80-4.5 MCG/ACT IN AERO: 2.0000 | INHALATION_SPRAY | Freq: Two times a day (BID) | RESPIRATORY_TRACT | 6 refills | Status: DC

## 2017-08-29 MED FILL — METHOCARBAMOL 500 MG TABS: 500 | 20 days supply | Qty: 60 | Fill #0

## 2017-08-29 MED FILL — !VENTOLIN HFA INHALER: 108 (90 BAS | 16 days supply | Qty: 18 | Fill #0

## 2017-08-29 MED FILL — SYMBICORT 80-4.5 MCG INH: 80-4.5 | 30 days supply | Qty: 10 | Fill #0

## 2017-08-29 MED FILL — DULoxetine HCL 60 MG CPEP: 60 | 30 days supply | Qty: 30 | Fill #0

## 2017-08-29 NOTE — ED Triage Notes (Signed)
Pt reports that he was seen recently for back issues, has MRI and diagnosed with spinal disorder, followed up with Neuro, saw PCP today, has has urinary incontinence for the past several days and told to come here
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
## 2017-08-30 NOTE — ED Notes (Signed)
Pt stated they are leaving 

## 2017-09-14 ENCOUNTER — Ambulatory Visit: Payer: Self-pay | Attending: Physician Assistant

## 2017-09-19 MED FILL — METHOCARBAMOL 500 MG TABS: 500 | 20 days supply | Qty: 60 | Fill #1

## 2017-09-24 MED FILL — !VENTOLIN HFA INHALER: 108 (90 BAS | 16 days supply | Qty: 18 | Fill #1

## 2017-09-24 MED FILL — ?DULoxetine HCL 6OMG CP: 60 | 30 days supply | Qty: 30 | Fill #1

## 2017-09-24 MED FILL — SYMBICORT 80-4.5 MCG INH: 80-4.5 | 30 days supply | Qty: 10 | Fill #1

## 2017-09-27 ENCOUNTER — Encounter (INDEPENDENT_AMBULATORY_CARE_PROVIDER_SITE_OTHER): Payer: Self-pay | Admitting: Physician Assistant

## 2017-09-27 ENCOUNTER — Other Ambulatory Visit: Payer: Self-pay

## 2017-09-27 ENCOUNTER — Ambulatory Visit (INDEPENDENT_AMBULATORY_CARE_PROVIDER_SITE_OTHER): Payer: Self-pay | Admitting: Physician Assistant

## 2017-09-27 VITALS — BP 135/87 | HR 61 | Temp 98.3°F | Ht 66.0 in | Wt 202.6 lb

## 2017-09-27 DIAGNOSIS — M5412 Radiculopathy, cervical region: Secondary | ICD-10-CM

## 2017-09-27 DIAGNOSIS — R899 Unspecified abnormal finding in specimens from other organs, systems and tissues: Secondary | ICD-10-CM

## 2017-09-27 DIAGNOSIS — L509 Urticaria, unspecified: Secondary | ICD-10-CM

## 2017-09-27 DIAGNOSIS — F4323 Adjustment disorder with mixed anxiety and depressed mood: Secondary | ICD-10-CM

## 2017-09-27 MED ORDER — DULOXETINE HCL 60 MG PO CPEP: 60.0000 mg | ORAL_CAPSULE | Freq: Every day | ORAL | 2 refills | Status: DC

## 2017-09-27 MED ORDER — HYDROXYZINE HCL 10 MG PO TABS: 10.0000 mg | ORAL_TABLET | Freq: Two times a day (BID) | ORAL | 0 refills | Status: DC | PRN

## 2017-09-27 MED FILL — hydrOXYzine HCL 10 MG TABS: 10 | 7 days supply | Qty: 14 | Fill #0

## 2017-09-27 NOTE — Progress Notes (Addendum)
Subjective:  Patient ID: Shawn Raymond, male    DOB: 09-27-1980  Age: 37 y.o. MRN: [id number]  CC: depression with anxiety  HPI Shawn Raymond is a 37 y.o. male with a medical history of cervical radiculopathy, asthma, tobacco disorder, and sarcoidosis? presents on f/u of "depression with anxiety". Wife interjects and says patient is not really a depressed or anxious person and has just struggled with the change in lifestyle produced by his cervical radiculopathy. Prescribed Duloxetine with mild relief of symptoms. PHQ9 26 and GAD7 21  one month ago. PHQ9 7 and GAD7  today in clinic.     Pt complains of small red and itchy spots on arms, legs, and back. Applied alcohol and Hydrocortisone with relief of spots. Spots are almost gone. Denies SOB, swelling, f/c/n/v, or abdominal pain.     Outpatient Medications Prior to Visit  Medication Sig Dispense Refill  . albuterol (PROVENTIL HFA;VENTOLIN HFA) 108 (90 Base) MCG/ACT inhaler Inhale 2 puffs into the lungs every 4 (four) hours as needed for wheezing or shortness of breath. 1 Inhaler 1  . budesonide-formoterol (SYMBICORT) 80-4.5 MCG/ACT inhaler Inhale 2 puffs into the lungs 2 (two) times daily. 1 Inhaler 6  . DULoxetine (CYMBALTA) 60 MG capsule Take 1 capsule (60 mg total) by mouth daily. 30 capsule 1  . methocarbamol (ROBAXIN) 500 MG tablet Take 1 tablet (500 mg total) by mouth 3 (three) times daily. 60 tablet 3  . Menthol, Topical Analgesic, (ICY HOT NATURALS) 7.5 % CREA Apply 1 application topically 2 (two) times daily as needed. (Patient not taking: Reported on 08/29/2017) 70.8 g 0  . diclofenac (VOLTAREN) 75 MG EC tablet Take 1 tablet (75 mg total) by mouth 2 (two) times daily. (Patient not taking: Reported on 08/01/2017) 14 tablet 0   No facility-administered medications prior to visit.      ROS Review of Systems  Constitutional: Negative for chills, fever and malaise/fatigue.  Eyes: Negative for blurred vision.  Respiratory:  Negative for shortness of breath.   Cardiovascular: Negative for chest pain and palpitations.  Gastrointestinal: Negative for abdominal pain and nausea.  Genitourinary: Negative for dysuria and hematuria.  Musculoskeletal: Positive for neck pain. Negative for joint pain and myalgias.  Skin: Negative for rash.  Neurological: Positive for tingling. Negative for headaches.  Psychiatric/Behavioral: Negative for depression. The patient is not nervous/anxious.     Objective:  BP 135/87 (BP Location: Left Arm, Patient Position: Sitting, Cuff Size: Normal)   Pulse 61   Temp 98.3 F (36.8 C) (Oral)   Ht 5\' 6"  (1.676 m)   Wt 202 lb 9.6 oz (91.9 kg)   SpO2 97%   BMI 32.70 kg/m   BP/Weight 09/27/2017 08/29/2017 08/29/2017  Systolic BP 135 140 146  Diastolic BP 87 99 99  Wt. (Lbs) 202.6 - 207.6  BMI 32.7 - 33.51      Physical Exam  Constitutional: He is oriented to person, place, and time.  Well developed, well nourished, NAD, polite  HENT:  Head: Normocephalic and atraumatic.  Eyes: No scleral icterus.  Neck:  Wearing neck brace. Has limited aROM of neck.  Cardiovascular: Normal rate, regular rhythm and normal heart sounds.  Pulmonary/Chest: Effort normal and breath sounds normal.  Musculoskeletal: He exhibits no edema.  Neurological: He is alert and oriented to person, place, and time.  Skin: Skin is warm and dry. No rash noted. No erythema. No pallor.  Psychiatric: He has a normal mood and affect. His behavior is normal. Thought  content normal.  Vitals reviewed.    Assessment & Plan:   1. Adjustment disorder with mixed anxiety and depressed mood - Pt has already been prescribed Duloxetine. Although I do not agree that Duloxetine be used for adjustment disorder, I agree that he should use Duloxetine for its neurologic effects. Continue Duloxetine for now as there seems to have been some mild reduction in pain.  2. Hives - Begin hydrOXYzine (ATARAX/VISTARIL) 10 MG tablet; Take  1 tablet (10 mg total) by mouth 2 (two) times daily as needed.  Dispense: 14 tablet; Refill: 0. Pt warned of possible drowsy effects of hydroxyzine.  3. Cervical radiculopathy - DULoxetine (CYMBALTA) 60 MG capsule; Take 1 capsule (60 mg total) by mouth daily.  Dispense: 30 capsule; Refill: 2  4. Abnormal Laboratory test - Per patient, says he had an abnormal biopsy of the lungs in 2010 which showed sarcoidosis. However, review of the surgical pathology report does not mention sarcoidosis. Furthermore, patient has not had ACE testing done.  - ACE test ordered   Meds ordered this encounter  Medications  . hydrOXYzine (ATARAX/VISTARIL) 10 MG tablet    Sig: Take 1 tablet (10 mg total) by mouth 2 (two) times daily as needed.    Dispense:  14 tablet    Refill:  0    Order Specific Question:   Supervising Provider    Answer:   Hoy RegisterNEWLIN, ENOBONG [[id number]]  . DULoxetine (CYMBALTA) 60 MG capsule    Sig: Take 1 capsule (60 mg total) by mouth daily.    Dispense:  30 capsule    Refill:  2    Order Specific Question:   Supervising Provider    Answer:   Hoy RegisterNEWLIN, ENOBONG [[id number]]    Follow-up: Return in about 6 weeks (around 11/08/2017) for adjustment disorder, tdap.   Loletta Specteroger David Gomez PA

## 2017-09-27 NOTE — Patient Instructions (Signed)
Hives Hives (urticaria) are itchy, red, swollen areas on your skin. Hives can show up on any part of your body, and they can vary in size. They can be as small as the tip of a pen or much larger. Hives often fade within 24 hours (acute hives). In other cases, new hives show up after old ones fade. This can continue for many days or weeks (chronic hives). Hives are caused by your body's reaction to an irritant or to something that you are allergic to (trigger). You can get hives right after being around a trigger or hours later. Hives do not spread from person to person (are not contagious). Hives may get worse if you scratch them, if you exercise, or if you have worries (emotional stress). Follow these instructions at home: Medicines  Take or apply over-the-counter and prescription medicines only as told by your doctor.  If you were prescribed an antibiotic medicine, use it as told by your doctor. Do not stop taking the antibiotic even if you start to feel better. Skin Care  Apply cool, wet cloths (cool compresses) to the itchy, red, swollen areas.  Do not scratch your skin. Do not rub your skin. General instructions  Do not take hot showers or baths. This can make itching worse.  Do not wear tight clothes.  Use sunscreen and wear clothing that covers your skin when you are outside.  Avoid any triggers that cause your hives. Keep a journal to help you keep track of what causes your hives. Write down: ? What medicines you take. ? What you eat and drink. ? What products you use on your skin.  Keep all follow-up visits as told by your doctor. This is important. Contact a doctor if:  Your symptoms are not better with medicine.  Your joints are painful or swollen. Get help right away if:  You have a fever.  You have belly pain.  Your tongue or lips are swollen.  Your eyelids are swollen.  Your chest or throat feels tight.  You have trouble breathing or swallowing. These  symptoms may be an emergency. Do not wait to see if the symptoms will go away. Get medical help right away. Call your local emergency services (911 in the [place] Do not drive yourself to the hospital. This information is not intended to replace advice given to you by your health care provider. Make sure you discuss any questions you have with your health care provider. Document Released: 01/11/2008 Document Revised: 09/09/2015 Document Reviewed: 01/20/2015 Elsevier Interactive Patient Education  2018 [employer]  

## 2017-09-29 LAB — ANGIOTENSIN CONVERTING ENZYME: ANGIO CONVERT ENZYME: 56 U/L (ref 14–82)

## 2017-10-02 ENCOUNTER — Telehealth (INDEPENDENT_AMBULATORY_CARE_PROVIDER_SITE_OTHER): Payer: Self-pay

## 2017-10-02 NOTE — Telephone Encounter (Signed)
Patient verified DOB, notified that testing was negative. No evidence for sarcoidosis is identified. After review of surgical pathology notes no granulomatous disease or malignancy. Patient asked to provide any outside records of any sarcoidosis work up he may have had. Patient expressed understanding. Maryjean Mornempestt S Roberts, CMA

## 2017-10-02 NOTE — Telephone Encounter (Signed)
-----   Message from Loletta Specteroger David Gomez, PA-C sent at 10/02/2017 12:10 PM EDT ----- Testing is negative. So far no evidence for Sarcoidosis is identified. Review of surgical pathology notes revealed no granulomatous disease or malignancy. Please provide outside records of any sarcoidosis work up he may have had.

## 2017-10-09 MED FILL — METHOCARBAMOL 500 MG TABS: 500 | 20 days supply | Qty: 60 | Fill #2

## 2017-10-26 ENCOUNTER — Other Ambulatory Visit (INDEPENDENT_AMBULATORY_CARE_PROVIDER_SITE_OTHER): Payer: Self-pay | Admitting: Nurse Practitioner

## 2017-10-26 DIAGNOSIS — J45909 Unspecified asthma, uncomplicated: Secondary | ICD-10-CM

## 2017-10-26 MED FILL — !VENTOLIN HFA INHALER: 108 (90 BAS | 16 days supply | Qty: 18 | Fill #0

## 2017-10-26 MED FILL — !SYMBICORT 80-4.5 MCG INH: 80-4.5 | 30 days supply | Qty: 1 | Fill #2

## 2017-10-26 MED FILL — ?DULoxetine HCL 6OMG CP: 60 | 30 days supply | Qty: 30 | Fill #0

## 2017-11-02 ENCOUNTER — Encounter (HOSPITAL_COMMUNITY): Payer: Self-pay

## 2017-11-02 ENCOUNTER — Ambulatory Visit (HOSPITAL_COMMUNITY)
Admission: EM | Admit: 2017-11-02 | Discharge: 2017-11-02 | Disposition: A | Discharge status: Home / Self Care | Payer: Self-pay | Attending: Internal Medicine | Admitting: Internal Medicine

## 2017-11-02 DIAGNOSIS — Z91013 Allergy to seafood: Secondary | ICD-10-CM | POA: Insufficient documentation

## 2017-11-02 DIAGNOSIS — D869 Sarcoidosis, unspecified: Secondary | ICD-10-CM | POA: Insufficient documentation

## 2017-11-02 DIAGNOSIS — R369 Urethral discharge, unspecified: Secondary | ICD-10-CM | POA: Insufficient documentation

## 2017-11-02 DIAGNOSIS — Z113 Encounter for screening for infections with a predominantly sexual mode of transmission: Secondary | ICD-10-CM

## 2017-11-02 DIAGNOSIS — Z79899 Other long term (current) drug therapy: Secondary | ICD-10-CM | POA: Insufficient documentation

## 2017-11-02 DIAGNOSIS — J45909 Unspecified asthma, uncomplicated: Secondary | ICD-10-CM | POA: Insufficient documentation

## 2017-11-02 DIAGNOSIS — Z202 Contact with and (suspected) exposure to infections with a predominantly sexual mode of transmission: Secondary | ICD-10-CM | POA: Insufficient documentation

## 2017-11-02 DIAGNOSIS — Z9889 Other specified postprocedural states: Secondary | ICD-10-CM | POA: Insufficient documentation

## 2017-11-02 DIAGNOSIS — F17210 Nicotine dependence, cigarettes, uncomplicated: Secondary | ICD-10-CM | POA: Insufficient documentation

## 2017-11-02 DIAGNOSIS — R300 Dysuria: Secondary | ICD-10-CM | POA: Insufficient documentation

## 2017-11-02 MED ORDER — CEFTRIAXONE SODIUM 250 MG IJ SOLR
INTRAMUSCULAR | Status: AC
  Filled 2017-11-02: qty 250

## 2017-11-02 MED ORDER — METRONIDAZOLE 500 MG PO TABS
2000.0000 mg | ORAL_TABLET | Freq: Once | ORAL | Status: AC
  Administered 2017-11-02: 2000 mg via ORAL

## 2017-11-02 MED ORDER — AZITHROMYCIN 250 MG PO TABS
1000.0000 mg | ORAL_TABLET | Freq: Once | ORAL | Status: AC
  Administered 2017-11-02: 1000 mg via ORAL

## 2017-11-02 MED ORDER — AZITHROMYCIN 250 MG PO TABS
ORAL_TABLET | ORAL | Status: AC
  Filled 2017-11-02: qty 4

## 2017-11-02 MED ORDER — METRONIDAZOLE 500 MG PO TABS
ORAL_TABLET | ORAL | Status: AC
  Filled 2017-11-02: qty 4

## 2017-11-02 MED ORDER — CEFTRIAXONE SODIUM 250 MG IJ SOLR
250.0000 mg | Freq: Once | INTRAMUSCULAR | Status: AC
  Administered 2017-11-02: 250 mg via INTRAMUSCULAR

## 2017-11-02 MED ORDER — LIDOCAINE HCL (PF) 1 % IJ SOLN
INTRAMUSCULAR | Status: AC
  Filled 2017-11-02: qty 2

## 2017-11-02 NOTE — Discharge Instructions (Signed)
You were treated empirically for gonorrhea, chlamydia, trichomonas. Flagyl 2g by mouth, Azithromycin 1g by mouth and Rocephin 250mg  injection given in office today. Cytology sent, you will be contacted with any positive results that requires further treatment. Refrain from sexual activity and alcohol use for the next 7 days. Monitor for any worsening of symptoms, fever, abdominal pain, nausea, vomiting, testicular swelling/pain, penile lesion, to follow up for reevaluation.

## 2017-11-02 NOTE — ED Provider Notes (Signed)
[facility]    CSN: [id number] Arrival date & time: 11/02/17  1831     History   Chief Complaint Chief Complaint  Patient presents with  . Exposure to STD    HPI Shawn Raymond is a 37 y.o. male.   37 year old male comes in for 1 day history of dysuria.  States has also had a little bit of penile discharge.  Denies testicular swelling, pain, penile lesion.  Denies fever, chills, night sweats.  Denies abdominal pain, nausea, vomiting.  Denies hematuria.  States that recently started with a new sexual partner, no condom use.  States she was recently "given a shot".     Past Medical History:  Diagnosis Date  . Asthma   . Sarcoidosis     Patient Active Problem List   Diagnosis Date Noted  . FRACTURE, ANKLE, RIGHT 08/09/2009  . ANKLE SPRAIN, RIGHT 08/09/2009  . COCAINE ABUSE 08/03/2009  . TOBACCO ABUSE 08/02/2009  . 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 INTRINSIC ASTHMA, UNSPECIFIED 03/23/2009  . Nonspecific (abnormal) findings on radiological and other examination of body structure 01/07/2009  . CT, CHEST, ABNORMAL 01/07/2009    Past Surgical History:  Procedure Laterality Date  . FEMUR FRACTURE SURGERY    . PATELLA RECONSTRUCTION         Home Medications    Prior to Admission medications   Medication Sig Start Date End Date Taking? Authorizing Provider  budesonide-formoterol (SYMBICORT) 80-4.5 MCG/ACT inhaler Inhale 2 puffs into the lungs 2 (two) times daily. 08/29/17   Claiborne RiggFleming, Zelda W, NP  DULoxetine (CYMBALTA) 60 MG capsule Take 1 capsule (60 mg total) by mouth daily. 09/27/17   Loletta SpecterGomez, Roger David, PA-C  hydrOXYzine (ATARAX/VISTARIL) 10 MG tablet Take 1 tablet (10 mg total) by mouth 2 (two) times daily as needed. 09/27/17   Loletta SpecterGomez, Roger David, PA-C  methocarbamol (ROBAXIN) 500 MG tablet Take 1 tablet (500 mg total) by mouth 3 (three) times daily. 08/29/17   Claiborne RiggFleming, Zelda W, NP  VENTOLIN HFA 108 (90 Base) MCG/ACT inhaler INHALE 2 PUFFS INTO THE  LUNGS EVERY 4 (FOUR) HOURS AS NEEDED FOR WHEEZING OR SHORTNESS OF BREATH. 10/26/17   Claiborne RiggFleming, Zelda W, NP    Family History Family History  Problem Relation Age of Onset  . Healthy Mother   . Healthy Father     Social History Social History   Tobacco Use  . Smoking status: Current Every Day Smoker    Packs/day: 0.70    Types: Cigarettes  . Smokeless tobacco: Never Used  Substance Use Topics  . Alcohol use: No  . Drug use: No     Allergies   Shellfish allergy   Review of Systems Review of Systems  Reason unable to perform ROS: See HPI as above.     Physical Exam Triage Vital Signs ED Triage Vitals  Enc Vitals Group     BP 11/02/17 1931 118/62     Pulse Rate 11/02/17 1931 81     Resp 11/02/17 1931 18     Temp 11/02/17 1931 (!) 97.4 F (36.3 C)     Temp src --      SpO2 11/02/17 1931 99 %     Weight --      Height --      Head Circumference --      Peak Flow --      Pain Score 11/02/17 1930 0     Pain Loc --      Pain Edu? --  Excl. in GC? --    No data found.  Updated Vital Signs BP 118/62   Pulse 81   Temp (!) 97.4 F (36.3 C)   Resp 18   SpO2 99%   Physical Exam  Constitutional: He is oriented to person, place, and time. He appears well-developed and well-nourished. No distress.  HENT:  Head: Normocephalic and atraumatic.  Eyes: Pupils are equal, round, and reactive to light. Conjunctivae are normal.  Neurological: He is alert and oriented to person, place, and time.  Skin: He is not diaphoretic.     UC Treatments / Results  Labs (all labs ordered are listed, but only abnormal results are displayed) Labs Reviewed  HIV ANTIBODY (ROUTINE TESTING)  URINE CYTOLOGY ANCILLARY ONLY    EKG None  Radiology No results found.  Procedures Procedures (including critical care time)  Medications Ordered in UC Medications  azithromycin (ZITHROMAX) tablet 1,000 mg (1,000 mg Oral Given 11/02/17 2022)  cefTRIAXone (ROCEPHIN) injection 250  mg (250 mg Intramuscular Given 11/02/17 2022)  metroNIDAZOLE (FLAGYL) tablet 2,000 mg (2,000 mg Oral Given 11/02/17 2022)    Initial Impression / Assessment and Plan / UC Course  I have reviewed the triage vital signs and the nursing notes.  Pertinent labs & imaging results that were available during my care of the patient were reviewed by me and considered in my medical decision making (see chart for details).    Patient was treated empirically for gonorrhea, chlamydia, trichomonas. Flagyl,  Azithromycin and Rocephin given in office today. Blood work and cytology sent, patient will be contacted with any positive results that require additional treatment. Patient to refrain from sexual activity for the next 7 days. Return precautions given.   Final Clinical Impressions(s) / UC Diagnoses   Final diagnoses:  Dysuria    ED Prescriptions    None       Belinda Fisher, PA-C 11/02/17 2131

## 2017-11-02 NOTE — ED Triage Notes (Signed)
Pt presents with dysuria after contact with new sexual partner, unprotected sex.

## 2017-11-03 LAB — HIV ANTIBODY (ROUTINE TESTING W REFLEX): HIV Screen 4th Generation wRfx: NONREACTIVE

## 2017-11-05 LAB — URINE CYTOLOGY ANCILLARY ONLY
Chlamydia: NEGATIVE
Neisseria Gonorrhea: NEGATIVE
Trichomonas: POSITIVE — AB

## 2017-11-05 MED FILL — METHOCARBAMOL 500 MG TABS: 500 | 20 days supply | Qty: 60 | Fill #3

## 2017-11-06 ENCOUNTER — Telehealth (HOSPITAL_COMMUNITY): Payer: Self-pay

## 2017-11-06 NOTE — Telephone Encounter (Signed)
Trichomonas is positive. Flagyl 2 gram PO given at urgent care visit. Pt contacted and made aware, educated to please refrain from sexual intercourse for 7 days to give the medicine time to work. Sexual partners need to be notified and tested/treated. Condoms may reduce risk of reinfection. Recheck for further evaluation if symptoms are not improving. Answered all questions.

## 2017-11-08 ENCOUNTER — Ambulatory Visit (INDEPENDENT_AMBULATORY_CARE_PROVIDER_SITE_OTHER): Payer: Self-pay | Admitting: Physician Assistant

## 2017-11-08 ENCOUNTER — Other Ambulatory Visit: Payer: Self-pay

## 2017-11-08 ENCOUNTER — Encounter (INDEPENDENT_AMBULATORY_CARE_PROVIDER_SITE_OTHER): Payer: Self-pay | Admitting: Physician Assistant

## 2017-11-08 VITALS — BP 136/93 | HR 69 | Temp 98.0°F | Ht 66.5 in | Wt 199.6 lb

## 2017-11-08 DIAGNOSIS — A599 Trichomoniasis, unspecified: Secondary | ICD-10-CM

## 2017-11-08 DIAGNOSIS — F4323 Adjustment disorder with mixed anxiety and depressed mood: Secondary | ICD-10-CM

## 2017-11-08 DIAGNOSIS — M5412 Radiculopathy, cervical region: Secondary | ICD-10-CM

## 2017-11-08 DIAGNOSIS — Z23 Encounter for immunization: Secondary | ICD-10-CM

## 2017-11-08 DIAGNOSIS — J45909 Unspecified asthma, uncomplicated: Secondary | ICD-10-CM

## 2017-11-08 MED ORDER — DULOXETINE HCL 60 MG PO CPEP: 60.0000 mg | ORAL_CAPSULE | Freq: Every day | ORAL | 11 refills | Status: DC

## 2017-11-08 MED ORDER — ALBUTEROL SULFATE HFA 108 (90 BASE) MCG/ACT IN AERS: INHALATION_SPRAY | RESPIRATORY_TRACT | 11 refills | Status: DC

## 2017-11-08 MED ORDER — METRONIDAZOLE 500 MG PO TABS: 500.0000 mg | ORAL_TABLET | Freq: Two times a day (BID) | ORAL | 0 refills | Status: AC

## 2017-11-08 MED ORDER — METHOCARBAMOL 500 MG PO TABS: 500.0000 mg | ORAL_TABLET | Freq: Three times a day (TID) | ORAL | 5 refills | Status: DC

## 2017-11-08 MED ORDER — BUDESONIDE-FORMOTEROL FUMARATE 160-4.5 MCG/ACT IN AERO: 2.0000 | INHALATION_SPRAY | Freq: Two times a day (BID) | RESPIRATORY_TRACT | 11 refills | Status: DC

## 2017-11-08 MED FILL — !VENTOLIN HFA INHALER: 108 (90 BAS | 16 days supply | Qty: 18 | Fill #0

## 2017-11-08 MED FILL — ?METRONIDAZOLE 500MG TABS: 500 | 7 days supply | Qty: 14 | Fill #0

## 2017-11-08 NOTE — Progress Notes (Signed)
Subjective:  Patient ID: Shawn Raymond, male    DOB: 1980-06-22  Age: 37 y.o. MRN: [id number]  CC: f/u adjustment disorder.  HPI Shawn Raymond is a 37 y.o. male with a medical history of cervical radiculopathy, asthma, tobacco disorder, and sarcoidosis? presents on f/u of adjustment disorder. PHQ9 7 and GAD7 4 last month. PHQ9 5 and GAD7 3 in clinic today. Taking Cymbalta 60 mg daily. Cervical pain is constant but has been relieved somewhat. Continues to work with orthopedic and is advised to have surgery to have cervical fusion done. Has some tingling of the bilateral upper extremities. Does not endorse CP, palpitations, SOB, HA, paralysis, abdominal pain, f/c/n/v, rash, or GI/GU sxs.     Says he needs treatment for trichomoniasis. Was tested after his girlfriend was found positive for trichomonas. Did not receive treatment from urgent care yet.     Needs a refill of his inhalers. Finds he is using Albuterol nearly every day despite taking Symbicort 80-4.5 mcg/act.      Outpatient Medications Prior to Visit  Medication Sig Dispense Refill  . budesonide-formoterol (SYMBICORT) 80-4.5 MCG/ACT inhaler Inhale 2 puffs into the lungs 2 (two) times daily. 1 Inhaler 6  . DULoxetine (CYMBALTA) 60 MG capsule Take 1 capsule (60 mg total) by mouth daily. 30 capsule 2  . methocarbamol (ROBAXIN) 500 MG tablet Take 1 tablet (500 mg total) by mouth 3 (three) times daily. 60 tablet 3  . VENTOLIN HFA 108 (90 Base) MCG/ACT inhaler INHALE 2 PUFFS INTO THE LUNGS EVERY 4 (FOUR) HOURS AS NEEDED FOR WHEEZING OR SHORTNESS OF BREATH. 18 g 1  . hydrOXYzine (ATARAX/VISTARIL) 10 MG tablet Take 1 tablet (10 mg total) by mouth 2 (two) times daily as needed. (Patient not taking: Reported on 11/08/2017) 14 tablet 0   No facility-administered medications prior to visit.      ROS Review of Systems  Constitutional: Negative for chills, fever and malaise/fatigue.  Eyes: Negative for blurred vision.   Respiratory: Negative for shortness of breath.   Cardiovascular: Negative for chest pain and palpitations.  Gastrointestinal: Negative for abdominal pain and nausea.  Genitourinary: Negative for dysuria and hematuria.  Musculoskeletal: Positive for neck pain. Negative for joint pain and myalgias.  Skin: Negative for rash.  Neurological: Positive for tingling. Negative for headaches.  Psychiatric/Behavioral: Negative for depression. The patient is not nervous/anxious.     Objective:  BP (!) 158/86 (BP Location: Left Arm, Patient Position: Sitting, Cuff Size: Normal)   Pulse 62   Temp 98 F (36.7 C) (Oral)   Ht 5' 6.5" (1.689 m)   Wt 199 lb 9.6 oz (90.5 kg)   SpO2 100%   BMI 31.73 kg/m   BP/Weight 11/08/2017 11/02/2017 09/27/2017  Systolic BP 158 118 135  Diastolic BP 86 62 87  Wt. (Lbs) 199.6 - 202.6  BMI 31.73 - 32.7      Physical Exam  Constitutional: He is oriented to person, place, and time.  Well developed, well nourished, NAD, polite  HENT:  Head: Normocephalic and atraumatic.  Eyes: No scleral icterus.  Neck: Normal range of motion. Neck supple. No thyromegaly present.  Cardiovascular: Normal rate, regular rhythm and normal heart sounds.  Pulmonary/Chest: Effort normal and breath sounds normal.  Abdominal: Soft. Bowel sounds are normal. There is no tenderness.  Musculoskeletal: He exhibits no edema.  Neurological: He is alert and oriented to person, place, and time.  Skin: Skin is warm and dry. No rash noted. No erythema. No pallor.  Psychiatric: He has a normal mood and affect. His behavior is normal. Thought content normal.  Vitals reviewed.    Assessment & Plan:   1. Adjustment disorder with mixed anxiety and depressed mood - Refill DULoxetine (CYMBALTA) 60 MG capsule; Take 1 capsule (60 mg total) by mouth daily.  Dispense: 30 capsule; Refill: 11  2. Trichomoniasis - Begin metroNIDAZOLE (FLAGYL) 500 MG tablet; Take 1 tablet (500 mg total) by mouth 2 (two)  times daily for 7 days.  Dispense: 14 tablet; Refill: 0  3. Uncomplicated asthma, unspecified asthma severity, unspecified whether persistent - Refill albuterol (VENTOLIN HFA) 108 (90 Base) MCG/ACT inhaler; INHALE 2 PUFFS INTO THE LUNGS EVERY 4 (FOUR) HOURS AS NEEDED FOR WHEEZING OR SHORTNESS OF BREATH.  Dispense: 18 g; Refill: 11 - Refill budesonide-formoterol (SYMBICORT) 160-4.5 MCG/ACT inhaler; Inhale 2 puffs into the lungs 2 (two) times daily.  Dispense: 1 Inhaler; Refill: 11  4. Cervical radiculopathy - Refill DULoxetine (CYMBALTA) 60 MG capsule; Take 1 capsule (60 mg total) by mouth daily.  Dispense: 30 capsule; Refill: 11 - Refill methocarbamol (ROBAXIN) 500 MG tablet; Take 1 tablet (500 mg total) by mouth 3 (three) times daily.  Dispense: 60 tablet; Refill: 5  5. Need for Tdap vaccination - Tdap vaccine greater than or equal to 7yo IM   Meds ordered this encounter  Medications  . metroNIDAZOLE (FLAGYL) 500 MG tablet    Sig: Take 1 tablet (500 mg total) by mouth 2 (two) times daily for 7 days.    Dispense:  14 tablet    Refill:  0    Order Specific Question:   Supervising Provider    Answer:   Hoy RegisterNEWLIN, ENOBONG [[id number]]  . DULoxetine (CYMBALTA) 60 MG capsule    Sig: Take 1 capsule (60 mg total) by mouth daily.    Dispense:  30 capsule    Refill:  11    Order Specific Question:   Supervising Provider    Answer:   Hoy RegisterNEWLIN, ENOBONG [[id number]]  . methocarbamol (ROBAXIN) 500 MG tablet    Sig: Take 1 tablet (500 mg total) by mouth 3 (three) times daily.    Dispense:  60 tablet    Refill:  5    Order Specific Question:   Supervising Provider    Answer:   Hoy RegisterNEWLIN, ENOBONG [[id number]]  . albuterol (VENTOLIN HFA) 108 (90 Base) MCG/ACT inhaler    Sig: INHALE 2 PUFFS INTO THE LUNGS EVERY 4 (FOUR) HOURS AS NEEDED FOR WHEEZING OR SHORTNESS OF BREATH.    Dispense:  18 g    Refill:  11    Order Specific Question:   Supervising Provider    Answer:   Hoy RegisterNEWLIN, ENOBONG [[id number]]  . budesonide-formoterol  (SYMBICORT) 160-4.5 MCG/ACT inhaler    Sig: Inhale 2 puffs into the lungs 2 (two) times daily.    Dispense:  1 Inhaler    Refill:  11    Order Specific Question:   Supervising Provider    Answer:   Hoy RegisterNEWLIN, ENOBONG [[id number]]    Follow-up: Return in about 6 months (around 05/11/2018) for cervical radiculopathy.   Loletta Specteroger David Ferol Laiche PA

## 2017-11-08 NOTE — Patient Instructions (Signed)
Td Vaccine (Tetanus and Diphtheria): What You Need to Know 1. Why get vaccinated? Tetanus  and diphtheria are very serious diseases. They are rare in the [place] today, but people who do become infected often have severe complications. Td vaccine is used to protect adolescents and adults from both of these diseases. Both tetanus and diphtheria are infections caused by bacteria. Diphtheria spreads from person to person through coughing or sneezing. Tetanus-causing bacteria enter the body through cuts, scratches, or wounds. TETANUS (lockjaw) causes painful muscle tightening and stiffness, usually all over the body.  It can lead to tightening of muscles in the head and neck so you can't open your mouth, swallow, or sometimes even breathe. Tetanus kills about 1 out of every 10 people who are infected even after receiving the best medical care.  DIPHTHERIA can cause a thick coating to form in the back of the throat.  It can lead to breathing problems, paralysis, heart failure, and death.  Before vaccines, as many as 200,000 cases of diphtheria and hundreds of cases of tetanus were reported in the [place] each year. Since vaccination began, reports of cases for both diseases have dropped by about 99%. 2. Td vaccine Td vaccine can protect adolescents and adults from tetanus and diphtheria. Td is usually given as a booster dose every 10 years but it can also be given earlier after a severe and dirty wound or burn. Another vaccine, called Tdap, which protects against pertussis in addition to tetanus and diphtheria, is sometimes recommended instead of Td vaccine. Your doctor or the person giving you the vaccine can give you more information. Td may safely be given at the same time as other vaccines. 3. Some people should not get this vaccine  A person who has ever had a life-threatening allergic reaction after a previous dose of any tetanus or diphtheria containing vaccine, OR has a severe  allergy to any part of this vaccine, should not get Td vaccine. Tell the person giving the vaccine about any severe allergies.  Talk to your doctor if you: ? had severe pain or swelling after any vaccine containing diphtheria or tetanus, ? ever had a condition called Guillain Barre Syndrome (GBS), ? aren't feeling well on the day the shot is scheduled. 4. What are the risks from Td vaccine? With any medicine, including vaccines, there is a chance of side effects. These are usually mild and go away on their own. Serious reactions are also possible but are rare. Most people who get Td vaccine do not have any problems with it. Mild problems following Td vaccine: (Did not interfere with activities)  Pain where the shot was given (about 8 people in 10)  Redness or swelling where the shot was given (about 1 person in 4)  Mild fever (rare)  Headache (about 1 person in 4)  Tiredness (about 1 person in 4)  Moderate problems following Td vaccine: (Interfered with activities, but did not require medical attention)  Fever over 102F (rare)  Severe problems following Td vaccine: (Unable to perform usual activities; required medical attention)  Swelling, severe pain, bleeding and/or redness in the arm where the shot was given (rare).  Problems that could happen after any vaccine:  People sometimes faint after a medical procedure, including vaccination. Sitting or lying down for about 15 minutes can help prevent fainting, and injuries caused by a fall. Tell your doctor if you feel dizzy, or have vision changes or ringing in the ears.  Some people get   severe pain in the shoulder and have difficulty moving the arm where a shot was given. This happens very rarely.  Any medication can cause a severe allergic reaction. Such reactions from a vaccine are very rare, estimated at fewer than 1 in a million doses, and would happen within a few minutes to a few hours after the vaccination. As with any  medicine, there is a very remote chance of a vaccine causing a serious injury or death. The safety of vaccines is always being monitored. For more information, visit: www.cdc.gov/vaccinesafety/ 5. What if there is a serious reaction? What should I look for? Look for anything that concerns you, such as signs of a severe allergic reaction, very high fever, or unusual behavior. Signs of a severe allergic reaction can include hives, swelling of the face and throat, difficulty breathing, a fast heartbeat, dizziness, and weakness. These would usually start a few minutes to a few hours after the vaccination. What should I do?  If you think it is a severe allergic reaction or other emergency that can't wait, call 9-1-1 or get the person to the nearest hospital. Otherwise, call your doctor.  Afterward, the reaction should be reported to the Vaccine Adverse Event Reporting System (VAERS). Your doctor might file this report, or you can do it yourself through the VAERS web site at www.vaers.hhs.gov, or by calling 1-800-822-7967. ? VAERS does not give medical advice. 6. The National Vaccine Injury Compensation Program The National Vaccine Injury Compensation Program (VICP) is a federal program that was created to compensate people who may have been injured by certain vaccines. Persons who believe they may have been injured by a vaccine can learn about the program and about filing a claim by calling 1-800-338-2382 or visiting the VICP website at www.hrsa.gov/vaccinecompensation. There is a time limit to file a claim for compensation. 7. How can I learn more?  Ask your doctor. He or she can give you the vaccine package insert or suggest other sources of information.  Call your local or state health department.  Contact the Centers for Disease Control and Prevention (CDC): ? Call 1-800-232-4636 (1-800-CDC-INFO) ? Visit CDC's website at www.cdc.gov/vaccines CDC Td Vaccine VIS (07/27/15) This information is  not intended to replace advice given to you by your health care provider. Make sure you discuss any questions you have with your health care provider. Document Released: 01/29/2006 Document Revised: 12/23/2015 Document Reviewed: 12/23/2015 Elsevier Interactive Patient Education  2017 Elsevier Inc.  

## 2017-11-19 MED FILL — DULoxetine HCL 60 MG CPEP: 60 | 30 days supply | Qty: 30 | Fill #0

## 2017-11-19 MED FILL — !VENTOLIN HFA INHALER: 108 (90 BAS | 16 days supply | Qty: 18 | Fill #1

## 2017-11-19 MED FILL — METHOCARBAMOL 500 MG TABS: 500 | 20 days supply | Qty: 60 | Fill #0

## 2017-11-19 MED FILL — !SYMBICORT 160-4.5 MCG INH: 160-4.5 | 30 days supply | Qty: 1 | Fill #0

## 2017-11-22 ENCOUNTER — Ambulatory Visit (HOSPITAL_COMMUNITY)
Admission: EM | Admit: 2017-11-22 | Discharge: 2017-11-22 | Disposition: A | Discharge status: Home / Self Care | Payer: Self-pay | Attending: Family Medicine | Admitting: Family Medicine

## 2017-11-22 ENCOUNTER — Encounter (HOSPITAL_COMMUNITY): Payer: Self-pay | Admitting: Emergency Medicine

## 2017-11-22 ENCOUNTER — Telehealth (INDEPENDENT_AMBULATORY_CARE_PROVIDER_SITE_OTHER): Payer: Self-pay | Admitting: Physician Assistant

## 2017-11-22 DIAGNOSIS — Z202 Contact with and (suspected) exposure to infections with a predominantly sexual mode of transmission: Secondary | ICD-10-CM

## 2017-11-22 DIAGNOSIS — Z79899 Other long term (current) drug therapy: Secondary | ICD-10-CM | POA: Insufficient documentation

## 2017-11-22 DIAGNOSIS — J45909 Unspecified asthma, uncomplicated: Secondary | ICD-10-CM | POA: Insufficient documentation

## 2017-11-22 DIAGNOSIS — R062 Wheezing: Secondary | ICD-10-CM

## 2017-11-22 DIAGNOSIS — R300 Dysuria: Secondary | ICD-10-CM | POA: Insufficient documentation

## 2017-11-22 DIAGNOSIS — D869 Sarcoidosis, unspecified: Secondary | ICD-10-CM | POA: Insufficient documentation

## 2017-11-22 DIAGNOSIS — Z7951 Long term (current) use of inhaled steroids: Secondary | ICD-10-CM | POA: Insufficient documentation

## 2017-11-22 DIAGNOSIS — F17210 Nicotine dependence, cigarettes, uncomplicated: Secondary | ICD-10-CM | POA: Insufficient documentation

## 2017-11-22 DIAGNOSIS — Z113 Encounter for screening for infections with a predominantly sexual mode of transmission: Secondary | ICD-10-CM | POA: Insufficient documentation

## 2017-11-22 DIAGNOSIS — Z7251 High risk heterosexual behavior: Secondary | ICD-10-CM

## 2017-11-22 LAB — POCT URINALYSIS DIP (DEVICE)
Bilirubin Urine: NEGATIVE
Glucose, UA: NEGATIVE mg/dL
HGB URINE DIPSTICK: NEGATIVE
Ketones, ur: NEGATIVE mg/dL
Leukocytes, UA: NEGATIVE
Nitrite: NEGATIVE
PH: 7 (ref 5.0–8.0)
Protein, ur: NEGATIVE mg/dL
SPECIFIC GRAVITY, URINE: 1.01 (ref 1.005–1.030)
Urobilinogen, UA: 0.2 mg/dL (ref 0.0–1.0)

## 2017-11-22 MED ORDER — CEFTRIAXONE SODIUM 250 MG IJ SOLR
250.0000 mg | Freq: Once | INTRAMUSCULAR | Status: AC
  Administered 2017-11-22: 250 mg via INTRAMUSCULAR

## 2017-11-22 MED ORDER — METRONIDAZOLE 500 MG PO TABS: 500.0000 mg | ORAL_TABLET | Freq: Two times a day (BID) | ORAL | 0 refills | Status: DC

## 2017-11-22 MED ORDER — AZITHROMYCIN 250 MG PO TABS
ORAL_TABLET | ORAL | Status: AC
  Filled 2017-11-22: qty 4

## 2017-11-22 MED ORDER — LIDOCAINE HCL (PF) 1 % IJ SOLN
INTRAMUSCULAR | Status: AC
  Filled 2017-11-22: qty 2

## 2017-11-22 MED ORDER — AZITHROMYCIN 250 MG PO TABS
1000.0000 mg | ORAL_TABLET | Freq: Once | ORAL | Status: AC
  Administered 2017-11-22: 1000 mg via ORAL

## 2017-11-22 MED ORDER — CEFTRIAXONE SODIUM 250 MG IJ SOLR
INTRAMUSCULAR | Status: AC
  Filled 2017-11-22: qty 250

## 2017-11-22 NOTE — ED Triage Notes (Signed)
Pt states he has dysuria, was treated for trich on 7/25 with 2grams flagyl, and given second script for flagyl from PCP, states he dropped his flagyl down the sink. C/o ongoing symptoms.

## 2017-11-22 NOTE — Discharge Instructions (Addendum)
Given rocephin 250mg  injection and azithromycin 1g in office Urine cytology obtained Declines HIV/ syphilis testing today Requests treatment for trichomoniasis Prescribed metronidazole 500 mg twice daily for 7 days (do not take while consuming alcohol) Take medications as prescribed and to completion We will follow up with you regarding the results of your test If tests are positive, please abstain from sexual activity for at least 7 days and notify partners Follow up with PCP if symptoms persists Return here or go to ER if you have any new or worsening symptoms   Wheezing heard on exam today.  Continue with at home inhaler.  If symptoms persists, or you experience new or worsening symptoms please follow up with your PCP, here or at the ED

## 2017-11-22 NOTE — ED Provider Notes (Signed)
[facility]   [id number] 11/22/17 Arrival Time: 1139   SUBJECTIVE:  Shawn Raymond is a 37 y.o. male who presents with complaints of dysuria x 4 days.  Last unprotected sex 1.5 weeks ago.  Patient is sexually active with 1 male partner.  Partner recently diagnosed with trichomoniasis.  Requests treatment for trichomoniasis.  States partner has already been treated.  Denies aggravating or alleviating factors.  Reports previous symptoms in the past and treated for gonorrhea, chlamydia, and trichomoniasis.  He denies fever, chills, nausea, vomiting, abdominal pain, urinary urgency, urinary frequency, penile discharge, penile rashes or lesions.    No LMP for male patient.  ROS: As per HPI.  Past Medical History:  Diagnosis Date  . Asthma   . Sarcoidosis    Past Surgical History:  Procedure Laterality Date  . FEMUR FRACTURE SURGERY    . PATELLA RECONSTRUCTION     Allergies  Allergen Reactions  . Shellfish Allergy Anaphylaxis   No current facility-administered medications on file prior to encounter.    Current Outpatient Medications on File Prior to Encounter  Medication Sig Dispense Refill  . albuterol (VENTOLIN HFA) 108 (90 Base) MCG/ACT inhaler INHALE 2 PUFFS INTO THE LUNGS EVERY 4 (FOUR) HOURS AS NEEDED FOR WHEEZING OR SHORTNESS OF BREATH. 18 g 11  . budesonide-formoterol (SYMBICORT) 160-4.5 MCG/ACT inhaler Inhale 2 puffs into the lungs 2 (two) times daily. 1 Inhaler 11  . DULoxetine (CYMBALTA) 60 MG capsule Take 1 capsule (60 mg total) by mouth daily. 30 capsule 11  . methocarbamol (ROBAXIN) 500 MG tablet Take 1 tablet (500 mg total) by mouth 3 (three) times daily. 60 tablet 5    Social History   Socioeconomic History  . Marital status: Divorced    Spouse name: Not on file  . Number of children: Not on file  . Years of education: Not on file  . Highest education level: Not on file  Occupational History  . Not on file  Social Needs  . Financial resource  strain: Not on file  . Food insecurity:    Worry: Not on file    Inability: Not on file  . Transportation needs:    Medical: Not on file    Non-medical: Not on file  Tobacco Use  . Smoking status: Current Every Day Smoker    Packs/day: 0.70    Types: Cigarettes  . Smokeless tobacco: Never Used  Substance and Sexual Activity  . Alcohol use: No  . Drug use: No  . Sexual activity: Not on file  Lifestyle  . Physical activity:    Days per week: Not on file    Minutes per session: Not on file  . Stress: Not on file  Relationships  . Social connections:    Talks on phone: Not on file    Gets together: Not on file    Attends religious service: Not on file    Active member of club or organization: Not on file    Attends meetings of clubs or organizations: Not on file    Relationship status: Not on file  . Intimate partner violence:    Fear of current or ex partner: Not on file    Emotionally abused: Not on file    Physically abused: Not on file    Forced sexual activity: Not on file  Other Topics Concern  . Not on file  Social History Narrative  . Not on file   Family History  Problem Relation Age of Onset  .  Healthy Mother   . Healthy Father     OBJECTIVE:  Vitals:   11/22/17 1147  BP: (!) 134/96  Pulse: 78  Resp: 18  Temp: 97.8 F (36.6 C)  SpO2: 100%    General appearance: alert, cooperative, appears stated age and no distress Throat: lips, mucosa, and tongue normal; teeth and gums normal Lungs: Scattered wheezes appreciated throughout bilateral lung fields Heart: regular rate and rhythm.  Radial pulses 2+ symmetrical bilaterally Abdomen: soft, non-tender; bowel sounds normal;  no guarding GU: declines Skin: warm and dry Psychological:  Alert and cooperative. Normal mood and affect.  Results for orders placed or performed during the hospital encounter of 11/22/17  POCT urinalysis dip (device)  Result Value Ref Range   Glucose, UA NEGATIVE NEGATIVE mg/dL     Bilirubin Urine NEGATIVE NEGATIVE   Ketones, ur NEGATIVE NEGATIVE mg/dL   Specific Gravity, Urine 1.010 1.005 - 1.030   Hgb urine dipstick NEGATIVE NEGATIVE   pH 7.0 5.0 - 8.0   Protein, ur NEGATIVE NEGATIVE mg/dL   Urobilinogen, UA 0.2 0.0 - 1.0 mg/dL   Nitrite NEGATIVE NEGATIVE   Leukocytes, UA NEGATIVE NEGATIVE    Labs Reviewed  POCT URINALYSIS DIP (DEVICE)  URINE CYTOLOGY ANCILLARY ONLY    ASSESSMENT & PLAN:  1. Dysuria   2. Unprotected sex   3. Wheezing   4. STD exposure     Meds ordered this encounter  Medications  . metroNIDAZOLE (FLAGYL) 500 MG tablet    Sig: Take 1 tablet (500 mg total) by mouth 2 (two) times daily.    Dispense:  14 tablet    Refill:  0    Order Specific Question:   Supervising Provider    Answer:   Isa Rankin [id number]  . cefTRIAXone (ROCEPHIN) injection 250 mg  . azithromycin (ZITHROMAX) tablet 1,000 mg    Pending: Labs Reviewed  POCT URINALYSIS DIP (DEVICE)  URINE CYTOLOGY ANCILLARY ONLY    Given rocephin 250mg  injection and azithromycin 1g in office Urine cytology obtained Declines HIV/ syphilis testing today Requests treatment for trichomoniasis Prescribed metronidazole 500 mg twice daily for 7 days (do not take while consuming alcohol) Take medications as prescribed and to completion We will follow up with you regarding the results of your test If tests are positive, please abstain from sexual activity for at least 7 days and notify partners Follow up with PCP if symptoms persists Return here or go to ER if you have any new or worsening symptoms   Wheezing heard on exam today.  Continue with at home inhaler.  If symptoms persists, or you experience new or worsening symptoms please follow up with your PCP, here or at the ED   Reviewed expectations re: course of current medical issues. Questions answered. Outlined signs and symptoms indicating need for more acute intervention. Patient verbalized understanding. After  Visit Summary given.       Rennis Harding, PA-C 11/22/17 1234

## 2017-11-22 NOTE — Telephone Encounter (Signed)
Per PCP, patient needs to come in and be retested before abx is refilled. [name], CMA

## 2017-11-22 NOTE — Telephone Encounter (Signed)
Patient called stating that he dropped his medication   metroNIDAZOLE (FLAGYL) 500 MG tablet    down in the sink. Patient stated he picked up his medication on 11-09-17 and that he also did not take his medication certain days. Patient would like to get a refill on medication.  Patient [facility]   [facility] [place]  Please Advice Louisa SecondGloria I Vargas Hernandez

## 2017-11-23 LAB — URINE CYTOLOGY ANCILLARY ONLY
CHLAMYDIA, DNA PROBE: NEGATIVE
Neisseria Gonorrhea: NEGATIVE
Trichomonas: NEGATIVE

## 2017-12-05 MED FILL — !VENTOLIN HFA INHALER: 108 (90 BAS | 16 days supply | Qty: 18 | Fill #1

## 2017-12-13 ENCOUNTER — Encounter (HOSPITAL_COMMUNITY): Payer: Self-pay | Admitting: Emergency Medicine

## 2017-12-13 ENCOUNTER — Emergency Department (HOSPITAL_COMMUNITY)
Admission: EM | Admit: 2017-12-13 | Discharge: 2017-12-13 | Discharge status: Against Medical Advice | Payer: Self-pay | Attending: Emergency Medicine | Admitting: Emergency Medicine

## 2017-12-13 DIAGNOSIS — M79602 Pain in left arm: Secondary | ICD-10-CM | POA: Insufficient documentation

## 2017-12-13 DIAGNOSIS — M79601 Pain in right arm: Secondary | ICD-10-CM

## 2017-12-13 DIAGNOSIS — Z5321 Procedure and treatment not carried out due to patient leaving prior to being seen by health care provider: Secondary | ICD-10-CM | POA: Insufficient documentation

## 2017-12-13 NOTE — ED Triage Notes (Signed)
Pt reports "my right arm feels like it is on fire"  Is treated for spinal stenosis.  Bilateral numbness and tingling in arms.

## 2017-12-13 NOTE — ED Notes (Signed)
Pt called three times, no answer.  

## 2017-12-13 NOTE — ED Notes (Signed)
Called pt 3x with no answer. First attempt at calling pt. Will try back in 10 minutes. Rn notified.

## 2017-12-13 NOTE — ED Provider Notes (Signed)
Patient placed in Quick Look pathway, seen and evaluated   Chief Complaint: right arm pain  HPI:   Edwin DadaClarence R Flaum is a 37 y.o. male who present to the ED with right arm pain that he thinks is due to spinal stenosis. Patient is treated by PCP, and his neuro surgeon for his symptoms. Patient plans to have surgery by [facility] but patient does not have insurance so it is not scheduled yet. Patient has talked with his PCP about the pain and the end of July he gave the patient refills on his medications but next appointment is not until September 9th. Patient here for pain management. This has been a problem for over 6 months but worse over the past 2 weeks.   ROS: Neck: pain   M/S: right arm and back pain  Neuro: tingling in fingers  Physical Exam:  BP 118/85 (BP Location: Left Arm)   Pulse 95   Temp 98.9 F (37.2 C) (Oral)   Resp 20   Ht 5\' 6"  (1.676 m)   Wt 90.7 kg   SpO2 99%   BMI 32.28 kg/m    Gen: No distress  Neuro: Awake and Alert, grips are equal  Skin: Warm and dry  M/S: pain with range of motion of right arm  Neck: tender over C spine.   Initiation of care has begun. The patient has been counseled on the process, plan, and necessity for staying for the completion/evaluation, and the remainder of the medical screening examination    Janne Napoleoneese, Mick Tanguma M, NP 12/13/17 1959    Terrilee FilesButler, Michael C, MD 12/25/17 [phone]

## 2017-12-19 MED FILL — ?DULoxetine HCL 60MG CPEP: 60 | 30 days supply | Qty: 30 | Fill #1

## 2017-12-19 MED FILL — METHOCARBAMOL 500 MG TABS: 500 | 20 days supply | Qty: 60 | Fill #1

## 2017-12-20 MED FILL — !SYMBICORT 160-4.5 MCG INH: 160-4.5 | 30 days supply | Qty: 1 | Fill #1

## 2017-12-20 MED FILL — !VENTOLIN HFA INHALER: 108 (90 BAS | 16 days supply | Qty: 18 | Fill #2

## 2017-12-24 ENCOUNTER — Ambulatory Visit (INDEPENDENT_AMBULATORY_CARE_PROVIDER_SITE_OTHER): Payer: Self-pay | Admitting: Physician Assistant

## 2017-12-24 ENCOUNTER — Encounter

## 2017-12-27 ENCOUNTER — Ambulatory Visit (INDEPENDENT_AMBULATORY_CARE_PROVIDER_SITE_OTHER): Payer: Self-pay | Admitting: Physician Assistant

## 2017-12-27 ENCOUNTER — Encounter (INDEPENDENT_AMBULATORY_CARE_PROVIDER_SITE_OTHER): Payer: Self-pay | Admitting: Physician Assistant

## 2017-12-27 ENCOUNTER — Other Ambulatory Visit: Payer: Self-pay

## 2017-12-27 ENCOUNTER — Encounter (HOSPITAL_COMMUNITY): Payer: Self-pay

## 2017-12-27 ENCOUNTER — Emergency Department (HOSPITAL_COMMUNITY)
Admission: EM | Admit: 2017-12-27 | Discharge: 2017-12-27 | Disposition: A | Discharge status: Home / Self Care | Payer: Self-pay | Attending: Emergency Medicine | Admitting: Emergency Medicine

## 2017-12-27 ENCOUNTER — Emergency Department (HOSPITAL_COMMUNITY): Payer: Self-pay

## 2017-12-27 VITALS — BP 137/96 | HR 65 | Temp 98.0°F | Ht 66.0 in | Wt 197.4 lb

## 2017-12-27 DIAGNOSIS — M4802 Spinal stenosis, cervical region: Secondary | ICD-10-CM

## 2017-12-27 DIAGNOSIS — J45909 Unspecified asthma, uncomplicated: Secondary | ICD-10-CM | POA: Insufficient documentation

## 2017-12-27 DIAGNOSIS — Z79899 Other long term (current) drug therapy: Secondary | ICD-10-CM | POA: Insufficient documentation

## 2017-12-27 DIAGNOSIS — R32 Unspecified urinary incontinence: Secondary | ICD-10-CM | POA: Insufficient documentation

## 2017-12-27 DIAGNOSIS — R202 Paresthesia of skin: Secondary | ICD-10-CM

## 2017-12-27 DIAGNOSIS — R159 Full incontinence of feces: Secondary | ICD-10-CM

## 2017-12-27 DIAGNOSIS — F17210 Nicotine dependence, cigarettes, uncomplicated: Secondary | ICD-10-CM | POA: Insufficient documentation

## 2017-12-27 DIAGNOSIS — M545 Low back pain: Secondary | ICD-10-CM | POA: Insufficient documentation

## 2017-12-27 DIAGNOSIS — M542 Cervicalgia: Secondary | ICD-10-CM

## 2017-12-27 DIAGNOSIS — G8929 Other chronic pain: Secondary | ICD-10-CM | POA: Insufficient documentation

## 2017-12-27 DIAGNOSIS — M5010 Cervical disc disorder with radiculopathy, unspecified cervical region: Secondary | ICD-10-CM

## 2017-12-27 LAB — BASIC METABOLIC PANEL
Anion gap: 11 (ref 5–15)
BUN: 11 mg/dL (ref 6–20)
CHLORIDE: 102 mmol/L (ref 98–111)
CO2: 25 mmol/L (ref 22–32)
Calcium: 9.3 mg/dL (ref 8.9–10.3)
Creatinine, Ser: 1.21 mg/dL (ref 0.61–1.24)
GFR calc Af Amer: >60 mL/min (ref >60)
GFR calc non Af Amer: >60 mL/min (ref >60)
GLUCOSE: 131 mg/dL — AB (ref 70–99)
Potassium: 4.1 mmol/L (ref 3.5–5.1)
Sodium: 138 mmol/L (ref 135–145)

## 2017-12-27 LAB — POCT URINALYSIS DIPSTICK
Bilirubin, UA: NEGATIVE
Blood, UA: NEGATIVE
Glucose, UA: NEGATIVE
KETONES UA: NEGATIVE
LEUKOCYTES UA: NEGATIVE
Nitrite, UA: NEGATIVE
PROTEIN UA: NEGATIVE
SPEC GRAV UA: 1.02 (ref 1.010–1.025)
UROBILINOGEN UA: 0.2 U/dL
pH, UA: 6 (ref 5.0–8.0)

## 2017-12-27 LAB — CBC WITH DIFFERENTIAL/PLATELET
Abs Immature Granulocytes: 0 10*3/uL (ref 0.0–0.1)
Basophils Absolute: 0.1 10*3/uL (ref 0.0–0.1)
Basophils Relative: 1 %
EOS PCT: 1 %
Eosinophils Absolute: 0.1 10*3/uL (ref 0.0–0.7)
HCT: 46.3 % (ref 39.0–52.0)
HEMOGLOBIN: 15 g/dL (ref 13.0–17.0)
Immature Granulocytes: 0 %
Lymphocytes Relative: 17 %
Lymphs Abs: 1.7 10*3/uL (ref 0.7–4.0)
MCH: 29.5 pg (ref 26.0–34.0)
MCHC: 32.4 g/dL (ref 30.0–36.0)
MCV: 91.1 fL (ref 78.0–100.0)
MONO ABS: 0.4 10*3/uL (ref 0.1–1.0)
Monocytes Relative: 4 %
Neutro Abs: 7.4 10*3/uL (ref 1.7–7.7)
Neutrophils Relative %: 77 %
Platelets: 242 10*3/uL (ref 150–400)
RBC: 5.08 MIL/uL (ref 4.22–5.81)
RDW: 12.5 % (ref 11.5–15.5)
WBC: 9.6 10*3/uL (ref 4.0–10.5)

## 2017-12-27 LAB — URINALYSIS, ROUTINE W REFLEX MICROSCOPIC
Bilirubin Urine: NEGATIVE
Glucose, UA: NEGATIVE mg/dL
HGB URINE DIPSTICK: NEGATIVE
KETONES UR: NEGATIVE mg/dL
Nitrite: NEGATIVE
PROTEIN: NEGATIVE mg/dL
Specific Gravity, Urine: 1.017 (ref 1.005–1.030)
pH: 6 (ref 5.0–8.0)

## 2017-12-27 MED ORDER — PREDNISONE 20 MG PO TABS: 60.0000 mg | ORAL_TABLET | Freq: Every day | ORAL | 0 refills | Status: DC

## 2017-12-27 MED FILL — predniSONE 20 MG TABS: 20 | 7 days supply | Qty: 21 | Fill #0
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
## 2017-12-27 NOTE — ED Provider Notes (Signed)
[facility] EMERGENCY DEPARTMENT Provider Note   CSN: [id number] Arrival date & time: 12/27/17  1336     History   Chief Complaint Chief Complaint  Patient presents with  . Back Pain    HPI Shawn Raymond is a 37 y.o. male.  He has known cervical radicular disc disease and is awaiting neurosurgical evaluation for discussion regarding surgery.  He has been troubled by constant neck and right arm pain since January.  He also has chronic back pain and about like sensation across to his abdomen.  For the last week he is had intermittent incontinence of urine and stool.  He is also had more spasm and tremor in his legs and falls despite using his cane.  He saw his primary care doctor today and with these new complaints they recommended he come here for further evaluation.  He denies fevers or chills.  No headache nausea or vomiting.  No dysuria.  The history is provided by the patient.  Back Pain   This is a new problem. The current episode started more than 1 week ago. The problem occurs constantly. The problem has not changed since onset.The pain is associated with falling. The pain is present in the lumbar spine. The pain does not radiate. Associated symptoms include numbness, bowel incontinence, bladder incontinence and weakness. Pertinent negatives include no chest pain, no fever, no abdominal pain and no dysuria. He has tried NSAIDs for the symptoms.    Past Medical History:  Diagnosis Date  . Asthma   . Sarcoidosis     Patient Active Problem List   Diagnosis Date Noted  . FRACTURE, ANKLE, RIGHT 08/09/2009  . ANKLE SPRAIN, RIGHT 08/09/2009  . COCAINE ABUSE 08/03/2009  . TOBACCO ABUSE 08/02/2009  . 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 INTRINSIC ASTHMA, UNSPECIFIED 03/23/2009  . Nonspecific (abnormal) findings on radiological and other examination of body structure 01/07/2009  . CT, CHEST, ABNORMAL 01/07/2009    Past Surgical History:  Procedure  Laterality Date  . FEMUR FRACTURE SURGERY    . PATELLA RECONSTRUCTION          Home Medications    Prior to Admission medications   Medication Sig Start Date End Date Taking? Authorizing Provider  albuterol (VENTOLIN HFA) 108 (90 Base) MCG/ACT inhaler INHALE 2 PUFFS INTO THE LUNGS EVERY 4 (FOUR) HOURS AS NEEDED FOR WHEEZING OR SHORTNESS OF BREATH. 11/08/17   Loletta Specter, PA-C  budesonide-formoterol [facility]) 160-4.5 MCG/ACT inhaler Inhale 2 puffs into the lungs 2 (two) times daily. 11/08/17   Loletta Specter, PA-C  DULoxetine (CYMBALTA) 60 MG capsule Take 1 capsule (60 mg total) by mouth daily. 11/08/17   Loletta Specter, PA-C  methocarbamol (ROBAXIN) 500 MG tablet Take 1 tablet (500 mg total) by mouth 3 (three) times daily. 11/08/17   Loletta Specter, PA-C  predniSONE (DELTASONE) 20 MG tablet Take 3 tablets (60 mg total) by mouth daily with breakfast. 12/27/17   Loletta Specter, PA-C    Family History Family History  Problem Relation Age of Onset  . Healthy Mother   . Healthy Father     Social History Social History   Tobacco Use  . Smoking status: Current Every Day Smoker    Packs/day: 0.70    Types: Cigarettes  . Smokeless tobacco: Never Used  . Tobacco comment: patient has been 5 days without a cigarette  Substance Use Topics  . Alcohol use: No  . Drug use: No  Allergies   Shellfish allergy   Review of Systems Review of Systems  Constitutional: Negative for fever.  HENT: Negative for sore throat.   Eyes: Negative for visual disturbance.  Respiratory: Negative for shortness of breath.   Cardiovascular: Negative for chest pain.  Gastrointestinal: Positive for bowel incontinence. Negative for abdominal pain.  Genitourinary: Positive for bladder incontinence. Negative for dysuria.  Musculoskeletal: Positive for back pain.  Skin: Negative for rash.  Neurological: Positive for weakness and numbness. Negative for speech difficulty.      Physical Exam Updated Vital Signs BP (!) 147/96 (BP Location: Left Arm)   Pulse 68   Temp 99.5 F (37.5 C) (Oral)   Resp 16   SpO2 100%   Physical Exam  Constitutional: He is oriented to person, place, and time. He appears well-developed and well-nourished.  HENT:  Head: Normocephalic and atraumatic.  Eyes: Conjunctivae are normal.  Neck: Neck supple.  Cardiovascular: Normal rate, regular rhythm and normal heart sounds.  No murmur heard. Pulmonary/Chest: Effort normal and breath sounds normal. No respiratory distress.  Abdominal: Soft. There is no tenderness.  Genitourinary: Rectum normal. Rectal exam shows anal tone normal.  Musculoskeletal: He exhibits no edema or deformity.  Neurological: He is alert and oriented to person, place, and time. No cranial nerve deficit. GCS eye subscore is 4. GCS verbal subscore is 5. GCS motor subscore is 6.  He has some burning pain of his right arm on the medial forearm into the hand.  Sensation in the legs is intact.  Distal strength intact in the legs.  He has normal perineal sensation and normal rectal tone.  Skin: Skin is warm and dry.  Psychiatric: He has a normal mood and affect.  Nursing note and vitals reviewed.    ED Treatments / Results  Labs (all labs ordered are listed, but only abnormal results are displayed) Labs Reviewed  BASIC METABOLIC PANEL - Abnormal; Notable for the following components:      Result Value   Glucose, Bld 131 (*)    All other components within normal limits  URINALYSIS, ROUTINE W REFLEX MICROSCOPIC - Abnormal; Notable for the following components:   Leukocytes, UA TRACE (*)    Bacteria, UA RARE (*)    All other components within normal limits  CBC WITH DIFFERENTIAL/PLATELET    EKG None  Radiology Mr Lumbar Spine Wo Contrast  Result Date: 12/27/2017 CLINICAL DATA:  Initial evaluation for acute back pain. Cauda equina syndrome. EXAM: MRI LUMBAR SPINE WITHOUT CONTRAST TECHNIQUE: Multiplanar,  multisequence MR imaging of the lumbar spine was performed. No intravenous contrast was administered. COMPARISON:  None. FINDINGS: Segmentation: Normal segmentation. Lowest well-formed disc labeled the L5-S1 level. Alignment: Normal alignment with preservation of the normal lumbar lordosis. No listhesis. Vertebrae: Vertebral body heights maintained without evidence for acute or chronic fracture. Bone marrow signal intensity within normal limits. No discrete or worrisome osseous lesions. No abnormal marrow edema. Conus medullaris and cauda equina: Conus extends to the L1 level. Conus and cauda equina appear normal. Paraspinal and other soft tissues: Paraspinous soft tissues within normal limits. Visualized visceral structures unremarkable. Disc levels: No significant findings seen through the L3-4 level. L4-5: Mild annular disc bulge with disc desiccation. Changes superimposed on short pedicles resultant mild bilateral lateral recess narrowing. Foramina remain patent. L5-S1: Unremarkable. IMPRESSION: 1. Mild annular disc bulge at L4-5 with resultant mild bilateral lateral recess narrowing. 2. Otherwise unremarkable MRI of the lumbar spine. Electronically Signed   By: Janell QuietBenjamin  McClintock M.D.  On: 12/27/2017 21:38    Procedures Procedures (including critical care time)  Medications Ordered in ED Medications - No data to display   Initial Impression / Assessment and Plan / ED Course  I have reviewed the triage vital signs and the nursing notes.  Pertinent labs & imaging results that were available during my care of the patient were reviewed by me and considered in my medical decision making (see chart for details).  Clinical Course as of Dec 29 1027  Thu Dec 27, 2017  2213 Patient's MRI did not show any obvious cauda equina findings.  I went out to talk to the patient but looks like he has eloped.   [MB]    Clinical Course User Index [MB] Terrilee Files, MD    Final Clinical Impressions(s)  / ED Diagnoses   Final diagnoses:  Chronic low back pain, unspecified back pain laterality, with sciatica presence unspecified  Incontinence of feces, unspecified fecal incontinence type  Urinary incontinence, unspecified type    ED Discharge Orders    None       Terrilee Files, MD 12/28/17 1029

## 2017-12-27 NOTE — ED Notes (Addendum)
Pt removed himself from all equipment and is refusing to have any additional vital signs performed.

## 2017-12-27 NOTE — Patient Instructions (Signed)
I ADVISE THAT YOU GO TO THE EMERGENCY ROOM IMMEDIATELY AFTER THIS VISIT. YOU WILL NEED TO HAVE YOUR BACK ASSESSED DUE TO URINARY INCONTINENCE, FECAL INCONTINENCE, AND SADDLE PARESTHESIAS.

## 2017-12-27 NOTE — Progress Notes (Signed)
Subjective:  Patient ID: Shawn Raymond, male    DOB: [birth year]  Age: 37 y.o. MRN: [id number]  CC: urinary incontinence. Pain in arm  HPI Shawn Raymond a 37 y.o.malewith a medical history of cervical radiculopathy, asthma, tobacco disorder, and sarcoidosis? Presents with complaint of urinary incontinence and right arm pain. Has felt constant "burning" of the RUE starting from the shoulder. Described as "flames" from the forearm to the hand. Had been to to [facility] which recommended cervical fusion for his cervical stenosis but has denied surgery due to lack of insurance/funds.     Complains of dysuria and difficulty holding in urine. Says there is complete release of urine within a few seconds of the initial sensation to urinate. Reports a  feeling of a "band" around his waist at all times.  Pt states he had a few episodes of fecal incontinence. Has also been having "burning" on the inner thigh bilaterally. Complains of an unsteady gait and some weakness of the bilateral lower extremity. He can not climb stairs without the aid of a cane due to LE weakness. Does not endorse any other neurological deficits, CP, palpitations, SOB, abdominal pain, f/c/n/v, or swelling.     Outpatient Medications Prior to Visit  Medication Sig Dispense Refill  . albuterol (VENTOLIN HFA) 108 (90 Base) MCG/ACT inhaler INHALE 2 PUFFS INTO THE LUNGS EVERY 4 (FOUR) HOURS AS NEEDED FOR WHEEZING OR SHORTNESS OF BREATH. 18 g 11  . budesonide-formoterol (SYMBICORT) 160-4.5 MCG/ACT inhaler Inhale 2 puffs into the lungs 2 (two) times daily. 1 Inhaler 11  . DULoxetine (CYMBALTA) 60 MG capsule Take 1 capsule (60 mg total) by mouth daily. 30 capsule 11  . methocarbamol (ROBAXIN) 500 MG tablet Take 1 tablet (500 mg total) by mouth 3 (three) times daily. 60 tablet 5  . metroNIDAZOLE (FLAGYL) 500 MG tablet Take 1 tablet (500 mg total) by mouth 2 (two) times daily. 14 tablet 0   No facility-administered  medications prior to visit.      ROS Review of Systems  Constitutional: Negative for chills, fever and malaise/fatigue.  Eyes: Negative for blurred vision.  Respiratory: Negative for shortness of breath.   Cardiovascular: Negative for chest pain and palpitations.  Gastrointestinal: Negative for abdominal pain and nausea.  Genitourinary: Negative for dysuria and hematuria.  Musculoskeletal: Negative for joint pain and myalgias.  Skin: Negative for rash.  Neurological: Positive for focal weakness. Negative for tingling and headaches.       Burning or RUE and inner thighs  Psychiatric/Behavioral: Negative for depression. The patient is not nervous/anxious.     Objective:  BP (!) 137/96 (BP Location: Left Arm, Patient Position: Sitting, Cuff Size: Large)   Pulse 65   Temp 98 F (36.7 C) (Oral)   Ht 5\' 6"  (1.676 m)   Wt 197 lb 6.4 oz (89.5 kg)   SpO2 95%   BMI 31.86 kg/m   BP/Weight 12/27/2017 12/13/2017 11/22/2017  Systolic BP 137 118 134  Diastolic BP 96 85 96  Wt. (Lbs) 197.4 200 -  BMI 31.86 32.28 -      Physical Exam  Constitutional: He is oriented to person, place, and time.  Well developed, well nourished, NAD, polite, wearing neck brace  HENT:  Head: Normocephalic and atraumatic.  Eyes: No scleral icterus.  Neck: Normal range of motion. Neck supple. No thyromegaly present.  Cardiovascular: Normal rate, regular rhythm and normal heart sounds.  Pulmonary/Chest: Effort normal and breath sounds normal.  Musculoskeletal: He exhibits no  edema or deformity.  Neurological: He is alert and oriented to person, place, and time. A sensory deficit (RUE) is present.  Gait instability. Weakness of the proximal and distal muscle groups. Can not step up to exam table and difficult standing from a seated position.   Skin: Skin is warm and dry. No rash noted. No erythema. No pallor.  Psychiatric: He has a normal mood and affect. His behavior is normal. Thought content normal.  Vitals  reviewed.    Assessment & Plan:    1. Cervical disc disorder with radiculopathy of cervical region - Begin predniSONE (DELTASONE) 20 MG tablet; Take 3 tablets (60 mg total) by mouth daily with breakfast.  Dispense: 21 tablet; Refill: 0  2. Spinal stenosis in cervical region - Urgent Ambulatory referral to Neurosurgery  3. Cervicalgia - Urgent Ambulatory referral to Neurosurgery   4. Urinary incontinence, unspecified type - Urinalysis Dipstick negative - Pt strongly advised to go to ED immediately after this visit as he is at risk for paralysis given his symptoms. Pt confirmed understanding and will go to the ED upon discharge.   5. Incontinence of feces, unspecified fecal incontinence type - Pt strongly advised to go to ED immediately after this visit as he is at risk for paralysis given his symptoms. Pt confirmed understanding and will go to the ED upon discharge.   6. Paresthesias - Saddle paresthesia - Pt strongly advised to go to ED immediately after this visit as he is at risk for paralysis given his symptoms. Pt confirmed understanding and will go to the ED upon discharge.   Meds ordered this encounter  Medications  . predniSONE (DELTASONE) 20 MG tablet    Sig: Take 3 tablets (60 mg total) by mouth daily with breakfast.    Dispense:  21 tablet    Refill:  0    Order Specific Question:   Supervising Provider    Answer:   Hoy Register [[id number]]    Follow-up: Return in about 4 weeks (around 01/24/2018) for NEUROLOGICAL DEFICITS.   Loletta Specter PA

## 2017-12-27 NOTE — ED Notes (Signed)
Bladder scan volume 24

## 2017-12-27 NOTE — ED Notes (Signed)
Pt returned from MRI °

## 2017-12-27 NOTE — ED Notes (Signed)
Patient transported to MRI 

## 2017-12-27 NOTE — ED Notes (Signed)
Pt refused vitals recheck when called. Stating, "I just don't want them done out here."

## 2017-12-27 NOTE — ED Notes (Signed)
Pt was seen leaving with his family by staff. Pt was ambulatory.

## 2017-12-27 NOTE — ED Notes (Signed)
Patient refuse his vitals

## 2017-12-31 ENCOUNTER — Telehealth (INDEPENDENT_AMBULATORY_CARE_PROVIDER_SITE_OTHER): Payer: Self-pay | Admitting: Physician Assistant

## 2017-12-31 NOTE — Telephone Encounter (Signed)
Patient has seen results on mychart and would like PCP to explain results in a simple form. Maryjean Mornempestt S Roberts, CMA

## 2017-12-31 NOTE — Telephone Encounter (Signed)
Pt has mild disc bulging. No signs in the part of spine which could lead to paralysis. He already has an urgent neurology referral placed since last week.

## 2017-12-31 NOTE — Telephone Encounter (Signed)
Patient called and left a voice mail wanting a call back from the nurse or PCP. Patient would like to know results for his X-rays and " What he would need to do next."   Please Follow up [phone]  Thank you Louisa SecondGloria I Vargas Hernandez

## 2018-01-01 NOTE — Telephone Encounter (Signed)
Patient is aware. No additional questions. Maryjean Mornempestt S Roberts, CMA

## 2018-01-15 MED FILL — METHOCARBAMOL 500 MG TABS: 500 | 20 days supply | Qty: 60 | Fill #2

## 2018-01-15 MED FILL — ?DULoxetine HCL 60MG CPEP: 60 | 30 days supply | Qty: 30 | Fill #2

## 2018-01-15 MED FILL — !VENTOLIN HFA INHALER: 108 (90 BAS | 16 days supply | Qty: 18 | Fill #3

## 2018-01-15 MED FILL — !SYMBICORT 160-4.5 MCG INH: 160-4.5 | 30 days supply | Qty: 1 | Fill #2

## 2018-01-24 ENCOUNTER — Ambulatory Visit (INDEPENDENT_AMBULATORY_CARE_PROVIDER_SITE_OTHER): Payer: Self-pay | Admitting: Physician Assistant

## 2018-01-24 ENCOUNTER — Other Ambulatory Visit: Payer: Self-pay

## 2018-01-24 ENCOUNTER — Encounter (INDEPENDENT_AMBULATORY_CARE_PROVIDER_SITE_OTHER): Payer: Self-pay | Admitting: Physician Assistant

## 2018-01-24 VITALS — BP 118/70 | Temp 98.0°F | Ht 66.0 in | Wt 197.6 lb

## 2018-01-24 DIAGNOSIS — R12 Heartburn: Secondary | ICD-10-CM

## 2018-01-24 DIAGNOSIS — M542 Cervicalgia: Secondary | ICD-10-CM

## 2018-01-24 DIAGNOSIS — M5010 Cervical disc disorder with radiculopathy, unspecified cervical region: Secondary | ICD-10-CM

## 2018-01-24 DIAGNOSIS — M4802 Spinal stenosis, cervical region: Secondary | ICD-10-CM

## 2018-01-24 MED ORDER — MELOXICAM 15 MG PO TABS: 15.0000 mg | ORAL_TABLET | Freq: Every day | ORAL | 0 refills | Status: DC

## 2018-01-24 MED ORDER — HYDROCODONE-ACETAMINOPHEN 5-325 MG PO TABS: 1.0000 | ORAL_TABLET | Freq: Every day | ORAL | 0 refills | Status: DC

## 2018-01-24 MED ORDER — OMEPRAZOLE 40 MG PO CPDR: 40.0000 mg | DELAYED_RELEASE_CAPSULE | Freq: Every day | ORAL | 3 refills | Status: DC

## 2018-01-24 MED FILL — MELOXICAM 15 MG TABLET: 15 | 30 days supply | Qty: 30 | Fill #0

## 2018-01-24 MED FILL — OMEPRAZOLE DR 40 MG CAPSULE: 40 | 30 days supply | Qty: 30 | Fill #0

## 2018-01-24 NOTE — Patient Instructions (Signed)
Please call [facility] [phone] to schedule your appointment.

## 2018-01-24 NOTE — Progress Notes (Signed)
Subjective:  Patient ID: Shawn Raymond, male    DOB: [birth year]  Age: 37 y.o. MRN: [id number]  CC: neck pain, back pain, f/u paresthesias  HPI Shawn Raymond a 37 y.o.malewith a medical history of cervical radiculopathy, asthma, tobacco disorder, and sarcoidosis? Presents to f/u on cervicalgia and LBP. Last seen here with symptoms of cervicalgia, LBP w/radiculopathy, urinary incontinence, fecal incontinence, and saddle paresthesias. Advised to go to ED and had MR Lumbar spine done. MR revealed mild annular disc bulge at L4-5 with resultant mild bilateral lateral recess narrowing. Normal cauda equina and conus. Previous MR C spine of 08/25/17 revealed more severe findings. Pt prescribed prednisone which helped significantly with his symptoms. States he has occasional saddle paresthesias. Cervicalgia and LBP still persistent and worse with activities. Mildly better with rest. Pt was referred to neurosurgery and given the option to pick between [facility] and [facility]. Both options would be self pay at $200 and $150 respectively. Pt chose to go to [facility] as they have attended to him before. Has not gone to reestablish care as of yet but is awaiting word for Medicaid coverage. Pt unable to afford pain clinic at this time. Does not endorse any other symptoms or complaints at this time.      Outpatient Medications Prior to Visit  Medication Sig Dispense Refill  . albuterol (VENTOLIN HFA) 108 (90 Base) MCG/ACT inhaler INHALE 2 PUFFS INTO THE LUNGS EVERY 4 (FOUR) HOURS AS NEEDED FOR WHEEZING OR SHORTNESS OF BREATH. (Patient taking differently: Inhale 2 puffs into the lungs every 4 (four) hours as needed for wheezing or shortness of breath. ) 18 g 11  . budesonide-formoterol (SYMBICORT) 160-4.5 MCG/ACT inhaler Inhale 2 puffs into the lungs 2 (two) times daily. 1 Inhaler 11  . DULoxetine (CYMBALTA) 60 MG capsule Take 1 capsule (60 mg total) by mouth  daily. 30 capsule 11  . methocarbamol (ROBAXIN) 500 MG tablet Take 1 tablet (500 mg total) by mouth 3 (three) times daily. 60 tablet 5  . predniSONE (DELTASONE) 20 MG tablet Take 3 tablets (60 mg total) by mouth daily with breakfast. 21 tablet 0   No facility-administered medications prior to visit.      ROS Review of Systems  Constitutional: Negative for chills, fever and malaise/fatigue.  Eyes: Negative for blurred vision.  Respiratory: Negative for shortness of breath.   Cardiovascular: Negative for chest pain and palpitations.  Gastrointestinal: Positive for heartburn. Negative for abdominal pain and nausea.  Genitourinary: Negative for dysuria and hematuria.  Musculoskeletal: Positive for back pain and neck pain. Negative for joint pain and myalgias.  Skin: Negative for rash.  Neurological: Positive for tingling and weakness (not currently). Negative for headaches.  Psychiatric/Behavioral: Negative for depression. The patient is not nervous/anxious.     Objective:  BP 118/70 (BP Location: Left Arm, Patient Position: Sitting, Cuff Size: Large)   Temp 98 F (36.7 C) (Oral)   Ht 5\' 6"  (1.676 m)   Wt 197 lb 9.6 oz (89.6 kg)   BMI 31.89 kg/m   BP/Weight 01/24/2018 12/27/2017 12/27/2017  Systolic BP 118 129 137  Diastolic BP 70 82 96  Wt. (Lbs) 197.6 - 197.4  BMI 31.89 - 31.86      Physical Exam  Constitutional: He is oriented to person, place, and time.  Well developed, well nourished, NAD, polite, wearing neck brace.  HENT:  Head: Normocephalic and atraumatic.  Eyes: No scleral icterus.  Neck: Normal range of motion. Neck supple. No  thyromegaly present.  Cardiovascular: Normal rate, regular rhythm and normal heart sounds.  Pulmonary/Chest: Effort normal and breath sounds normal.  Musculoskeletal: He exhibits no edema.  Normal tonicity of the paraspinals of the C-T-L spine. Limited aROM of the L and C spine 2/2 pain.  Neurological: He is alert and oriented to person,  place, and time.  Somewhat antalgic gait  Skin: Skin is warm and dry. No rash noted. No erythema. No pallor.  Psychiatric: He has a normal mood and affect. His behavior is normal. Thought content normal.  Vitals reviewed.    Assessment & Plan:   1. Cervical disc disorder with radiculopathy of cervical region - Pt advised to complete CAFA or inform us of Medicaid approval. Will refer once patient notifies [place] of CAFA or Medicaid approval.  - Begin HYDROcodone-acetaminophen (NORCO) 5-325 MG tablet; Take 1 tablet by mouth at bedtime.  Dispense: 30 tablet; Refill: 0. Pt educated about addictive potential of opioid medications. He agrees to take sparingly and only when pain is unbearable.  - Begin meloxicam (MOBIC) 15 MG tablet; Take 1 tablet (15 mg total) by mouth daily.  Dispense: 30 tablet; Refill: 0  2. Spinal stenosis in cervical region - Begin HYDROcodone-acetaminophen (NORCO) 5-325 MG tablet; Take 1 tablet by mouth at bedtime.  Dispense: 30 tablet; Refill: 0. Pt educated about addictive potential of opioid medications. He agrees to take sparingly and only when pain is unbearable.  - Begin meloxicam (MOBIC) 15 MG tablet; Take 1 tablet (15 mg total) by mouth daily.  Dispense: 30 tablet; Refill: 0  3. Cervicalgia - Begin HYDROcodone-acetaminophen (NORCO) 5-325 MG tablet; Take 1 tablet by mouth at bedtime.  Dispense: 30 tablet; Refill: 0. Pt educated about addictive potential of opioid medications. He agrees to take sparingly and only when pain is unbearable.  - Begin meloxicam (MOBIC) 15 MG tablet; Take 1 tablet (15 mg total) by mouth daily.  Dispense: 30 tablet; Refill: 0  4. Heartburn - Begin omeprazole (PRILOSEC) 40 MG capsule; Take 1 capsule (40 mg total) by mouth daily.  Dispense: 30 capsule; Refill: 3   Meds ordered this encounter  Medications  . omeprazole (PRILOSEC) 40 MG capsule    Sig: Take 1 capsule (40 mg total) by mouth daily.    Dispense:  30 capsule    Refill:  3     Order Specific Question:   Supervising Provider    Answer:   Hoy Register [[id number]]  . HYDROcodone-acetaminophen (NORCO) 5-325 MG tablet    Sig: Take 1 tablet by mouth at bedtime.    Dispense:  30 tablet    Refill:  0    Order Specific Question:   Supervising Provider    Answer:   Hoy Register [[id number]]  . meloxicam (MOBIC) 15 MG tablet    Sig: Take 1 tablet (15 mg total) by mouth daily.    Dispense:  30 tablet    Refill:  0    Order Specific Question:   Supervising Provider    Answer:   Hoy Register [[id number]]    Follow-up: Return in about 4 weeks (around 02/21/2018) for Neck pain and back pain.   Loletta Specter PA

## 2018-01-25 DIAGNOSIS — M4802 Spinal stenosis, cervical region: Secondary | ICD-10-CM | POA: Insufficient documentation

## 2018-01-25 DIAGNOSIS — R12 Heartburn: Secondary | ICD-10-CM | POA: Insufficient documentation

## 2018-01-25 DIAGNOSIS — G992 Myelopathy in diseases classified elsewhere: Secondary | ICD-10-CM | POA: Diagnosis present

## 2018-01-25 DIAGNOSIS — M542 Cervicalgia: Secondary | ICD-10-CM | POA: Insufficient documentation

## 2018-01-25 DIAGNOSIS — M5010 Cervical disc disorder with radiculopathy, unspecified cervical region: Secondary | ICD-10-CM | POA: Insufficient documentation

## 2018-02-06 ENCOUNTER — Other Ambulatory Visit (INDEPENDENT_AMBULATORY_CARE_PROVIDER_SITE_OTHER): Payer: Self-pay | Admitting: Physician Assistant

## 2018-02-06 DIAGNOSIS — M5010 Cervical disc disorder with radiculopathy, unspecified cervical region: Secondary | ICD-10-CM

## 2018-02-06 DIAGNOSIS — M4802 Spinal stenosis, cervical region: Secondary | ICD-10-CM

## 2018-02-06 DIAGNOSIS — M542 Cervicalgia: Secondary | ICD-10-CM

## 2018-02-06 MED FILL — SYMBICORT 160-4.5 MCG INH: 160-4.5 | 30 days supply | Qty: 10 | Fill #3

## 2018-02-06 MED FILL — METHOCARBAMOL 500 MG TABS: 500 | 20 days supply | Qty: 60 | Fill #3

## 2018-02-06 NOTE — Telephone Encounter (Signed)
Patient request for meloxicam

## 2018-02-07 MED FILL — !PROVENTIL HFA 90 MCG INH: 108 (90 BAS | 25 days supply | Qty: 1 | Fill #0

## 2018-02-08 ENCOUNTER — Other Ambulatory Visit: Payer: Self-pay

## 2018-02-08 ENCOUNTER — Ambulatory Visit (INDEPENDENT_AMBULATORY_CARE_PROVIDER_SITE_OTHER): Payer: Self-pay | Admitting: Physician Assistant

## 2018-02-08 ENCOUNTER — Encounter (INDEPENDENT_AMBULATORY_CARE_PROVIDER_SITE_OTHER): Payer: Self-pay | Admitting: Physician Assistant

## 2018-02-08 VITALS — BP 149/105 | HR 62 | Temp 97.4°F | Ht 66.0 in | Wt 206.8 lb

## 2018-02-08 DIAGNOSIS — R062 Wheezing: Secondary | ICD-10-CM

## 2018-02-08 DIAGNOSIS — R030 Elevated blood-pressure reading, without diagnosis of hypertension: Secondary | ICD-10-CM

## 2018-02-08 DIAGNOSIS — R600 Localized edema: Secondary | ICD-10-CM

## 2018-02-08 MED ORDER — FUROSEMIDE 40 MG PO TABS: 40.0000 mg | ORAL_TABLET | Freq: Every day | ORAL | 1 refills | Status: DC | PRN

## 2018-02-08 MED FILL — ?FUROSEMIDE 40 MG TABLET: 40 | 30 days supply | Qty: 30 | Fill #0

## 2018-02-08 NOTE — Progress Notes (Signed)
Subjective:  Patient ID: Shawn Raymond, male    DOB: [birth year]  Age: 37 y.o. MRN: [id number]  CC: edema  HPI JAHEEM HEDGEPATH a 37 y.o.malewith a medical history of cervical radiculopathy, cervical stenosis, asthma, and tobacco disorder presents with complaint of LE edema x2 weeks. Has elevated legs and LE edema has resolved. BP noted to be elevated today but unclear if from neck pain. Last BP 118/70 mmHg 15 days ago. Only symptom endorsed was mild headache. Currently has mild wheezing but not SOB. Ran out of albuterol inhaler but plans to pick up after this encounter. Denies CP, palpitations, PND, orthopnea, abdominal pain, malaise, f/c/n/v, rash, or GI/GU sxs.      Outpatient Medications Prior to Visit  Medication Sig Dispense Refill  . albuterol (VENTOLIN HFA) 108 (90 Base) MCG/ACT inhaler INHALE 2 PUFFS INTO THE LUNGS EVERY 4 (FOUR) HOURS AS NEEDED FOR WHEEZING OR SHORTNESS OF BREATH. (Patient taking differently: Inhale 2 puffs into the lungs every 4 (four) hours as needed for wheezing or shortness of breath. ) 18 g 11  . budesonide-formoterol (SYMBICORT) 160-4.5 MCG/ACT inhaler Inhale 2 puffs into the lungs 2 (two) times daily. 1 Inhaler 11  . DULoxetine (CYMBALTA) 60 MG capsule Take 1 capsule (60 mg total) by mouth daily. 30 capsule 11  . meloxicam (MOBIC) 15 MG tablet TAKE 1 TABLET (15 MG TOTAL) BY MOUTH DAILY. 30 tablet 0  . methocarbamol (ROBAXIN) 500 MG tablet Take 1 tablet (500 mg total) by mouth 3 (three) times daily. 60 tablet 5  . omeprazole (PRILOSEC) 40 MG capsule Take 1 capsule (40 mg total) by mouth daily. 30 capsule 3  . HYDROcodone-acetaminophen (NORCO) 5-325 MG tablet Take 1 tablet by mouth at bedtime. (Patient not taking: Reported on 02/08/2018) 30 tablet 0   No facility-administered medications prior to visit.      ROS Review of Systems  Constitutional: Negative for chills, fever and malaise/fatigue.  Eyes: Negative for blurred vision.   Respiratory: Negative for shortness of breath.   Cardiovascular: Negative for chest pain and palpitations.  Gastrointestinal: Negative for abdominal pain and nausea.  Genitourinary: Negative for dysuria and hematuria.  Musculoskeletal: Negative for joint pain and myalgias.  Skin: Negative for rash.  Neurological: Negative for tingling and headaches.  Psychiatric/Behavioral: Negative for depression. The patient is not nervous/anxious.     Objective:  BP (!) 159/113 (BP Location: Left Arm, Patient Position: Sitting, Cuff Size: Large)   Pulse 68   Temp (!) 97.4 F (36.3 C) (Oral)   Ht 5\' 6"  (1.676 m)   Wt 206 lb 12.8 oz (93.8 kg)   SpO2 97%   BMI 33.38 kg/m   BP/Weight 02/08/2018 01/24/2018 12/27/2017  Systolic BP 159 118 129  Diastolic BP 113 70 82  Wt. (Lbs) 206.8 197.6 -  BMI 33.38 31.89 -      Physical Exam  Constitutional: He is oriented to person, place, and time.  Well developed, well nourished, NAD, polite  HENT:  Head: Normocephalic and atraumatic.  Eyes: No scleral icterus.  Neck: Normal range of motion. Neck supple. No thyromegaly present.  Cardiovascular: Normal rate, regular rhythm and normal heart sounds.  No LE edema bilaterally  Pulmonary/Chest: Effort normal and breath sounds normal.  Musculoskeletal: He exhibits no edema.  Neurological: He is alert and oriented to person, place, and time.  Skin: Skin is warm and dry. No rash noted. No erythema. No pallor.  Psychiatric: He has a normal mood and affect. His behavior is  normal. Thought content normal.  Vitals reviewed.    Assessment & Plan:    1. Bilateral lower extremity edema - Begin PRN furosemide (LASIX) 40 MG tablet; Take 1 tablet (40 mg total) by mouth daily as needed.  Dispense: 30 tablet; Refill: 1  2. Elevated blood pressure reading without diagnosis of hypertension - TSH - Comprehensive metabolic panel  3. Wheezing - mild wheezing. Ran out of albuterol but will pick up after this  encounter.     Meds ordered this encounter  Medications  . furosemide (LASIX) 40 MG tablet    Sig: Take 1 tablet (40 mg total) by mouth daily as needed.    Dispense:  30 tablet    Refill:  1    Order Specific Question:   Supervising Provider    Answer:   Hoy Register [[id number]]    Follow-up: Return if symptoms worsen or fail to improve.   Loletta Specter PA

## 2018-02-08 NOTE — Patient Instructions (Addendum)
DASH Eating Plan DASH stands for "Dietary Approaches to Stop Hypertension." The DASH eating plan is a healthy eating plan that has been shown to reduce high blood pressure (hypertension). It may also reduce your risk for type 2 diabetes, heart disease, and stroke. The DASH eating plan may also help with weight loss. What are tips for following this plan? General guidelines  Avoid eating more than 2,300 mg (milligrams) of salt (sodium) a day. If you have hypertension, you may need to reduce your sodium intake to 1,500 mg a day.  Limit alcohol intake to no more than 1 drink a day for nonpregnant women and 2 drinks a day for men. One drink equals 12 oz of beer, 5 oz of wine, or 1 oz of hard liquor.  Work with your health care provider to maintain a healthy body weight or to lose weight. Ask what an ideal weight is for you.  Get at least 30 minutes of exercise that causes your heart to beat faster (aerobic exercise) most days of the week. Activities may include walking, swimming, or biking.  Work with your health care provider or diet and nutrition specialist (dietitian) to adjust your eating plan to your individual calorie needs. Reading food labels  Check food labels for the amount of sodium per serving. Choose foods with less than 5 percent of the Daily Value of sodium. Generally, foods with less than 300 mg of sodium per serving fit into this eating plan.  To find whole grains, look for the word "whole" as the first word in the ingredient list. Shopping  Buy products labeled as "low-sodium" or "no salt added."  Buy fresh foods. Avoid canned foods and premade or frozen meals. Cooking  Avoid adding salt when cooking. Use salt-free seasonings or herbs instead of table salt or sea salt. Check with your health care provider or pharmacist before using salt substitutes.  Do not fry foods. Cook foods using healthy methods such as baking, boiling, grilling, and broiling instead.  Cook with  heart-healthy oils, such as olive, canola, soybean, or sunflower oil. Meal planning   Eat a balanced diet that includes: ? 5 or more servings of fruits and vegetables each day. At each meal, try to fill half of your plate with fruits and vegetables. ? Up to 6-8 servings of whole grains each day. ? Less than 6 oz of lean meat, poultry, or fish each day. A 3-oz serving of meat is about the same size as a deck of cards. One egg equals 1 oz. ? 2 servings of low-fat dairy each day. ? A serving of nuts, seeds, or beans 5 times each week. ? Heart-healthy fats. Healthy fats called Omega-3 fatty acids are found in foods such as flaxseeds and coldwater fish, like sardines, salmon, and mackerel.  Limit how much you eat of the following: ? Canned or prepackaged foods. ? Food that is high in trans fat, such as fried foods. ? Food that is high in saturated fat, such as fatty meat. ? Sweets, desserts, sugary drinks, and other foods with added sugar. ? Full-fat dairy products.  Do not salt foods before eating.  Try to eat at least 2 vegetarian meals each week.  Eat more home-cooked food and less restaurant, buffet, and fast food.  When eating at a restaurant, ask that your food be prepared with less salt or no salt, if possible. What foods are recommended? The items listed may not be a complete list. Talk with your dietitian about what   dietary choices are best for you. Grains Whole-grain or whole-wheat bread. Whole-grain or whole-wheat pasta. Brown rice. Oatmeal. Quinoa. Bulgur. Whole-grain and low-sodium cereals. Pita bread. Low-fat, low-sodium crackers. Whole-wheat flour tortillas. Vegetables Fresh or frozen vegetables (raw, steamed, roasted, or grilled). Low-sodium or reduced-sodium tomato and vegetable juice. Low-sodium or reduced-sodium tomato sauce and tomato paste. Low-sodium or reduced-sodium canned vegetables. Fruits All fresh, dried, or frozen fruit. Canned fruit in natural juice (without  added sugar). Meat and other protein foods Skinless chicken or turkey. Ground chicken or turkey. Pork with fat trimmed off. Fish and seafood. Egg whites. Dried beans, peas, or lentils. Unsalted nuts, nut butters, and seeds. Unsalted canned beans. Lean cuts of beef with fat trimmed off. Low-sodium, lean deli meat. Dairy Low-fat (1%) or fat-free (skim) milk. Fat-free, low-fat, or reduced-fat cheeses. Nonfat, low-sodium ricotta or cottage cheese. Low-fat or nonfat yogurt. Low-fat, low-sodium cheese. Fats and oils Soft margarine without trans fats. Vegetable oil. Low-fat, reduced-fat, or light mayonnaise and salad dressings (reduced-sodium). Canola, safflower, olive, soybean, and sunflower oils. Avocado. Seasoning and other foods Herbs. Spices. Seasoning mixes without salt. Unsalted popcorn and pretzels. Fat-free sweets. What foods are not recommended? The items listed may not be a complete list. Talk with your dietitian about what dietary choices are best for you. Grains Baked goods made with fat, such as croissants, muffins, or some breads. Dry pasta or rice meal packs. Vegetables Creamed or fried vegetables. Vegetables in a cheese sauce. Regular canned vegetables (not low-sodium or reduced-sodium). Regular canned tomato sauce and paste (not low-sodium or reduced-sodium). Regular tomato and vegetable juice (not low-sodium or reduced-sodium). Pickles. Olives. Fruits Canned fruit in a light or heavy syrup. Fried fruit. Fruit in cream or butter sauce. Meat and other protein foods Fatty cuts of meat. Ribs. Fried meat. Bacon. Sausage. Bologna and other processed lunch meats. Salami. Fatback. Hotdogs. Bratwurst. Salted nuts and seeds. Canned beans with added salt. Canned or smoked fish. Whole eggs or egg yolks. Chicken or turkey with skin. Dairy Whole or 2% milk, cream, and half-and-half. Whole or full-fat cream cheese. Whole-fat or sweetened yogurt. Full-fat cheese. Nondairy creamers. Whipped toppings.  Processed cheese and cheese spreads. Fats and oils Butter. Stick margarine. Lard. Shortening. Ghee. Bacon fat. Tropical oils, such as coconut, palm kernel, or palm oil. Seasoning and other foods Salted popcorn and pretzels. Onion salt, garlic salt, seasoned salt, table salt, and sea salt. Worcestershire sauce. Tartar sauce. Barbecue sauce. Teriyaki sauce. Soy sauce, including reduced-sodium. Steak sauce. Canned and packaged gravies. Fish sauce. Oyster sauce. Cocktail sauce. Horseradish that you find on the shelf. Ketchup. Mustard. Meat flavorings and tenderizers. Bouillon cubes. Hot sauce and Tabasco sauce. Premade or packaged marinades. Premade or packaged taco seasonings. Relishes. Regular salad dressings. Where to find more information:  National Heart, Lung, and Blood Institute: www.nhlbi.nih.gov  American Heart Association: www.heart.org Summary  The DASH eating plan is a healthy eating plan that has been shown to reduce high blood pressure (hypertension). It may also reduce your risk for type 2 diabetes, heart disease, and stroke.  With the DASH eating plan, you should limit salt (sodium) intake to 2,300 mg a day. If you have hypertension, you may need to reduce your sodium intake to 1,500 mg a day.  When on the DASH eating plan, aim to eat more fresh fruits and vegetables, whole grains, lean proteins, low-fat dairy, and heart-healthy fats.  Work with your health care provider or diet and nutrition specialist (dietitian) to adjust your eating plan to your individual   calorie needs. This information is not intended to replace advice given to you by your health care provider. Make sure you discuss any questions you have with your health care provider. Document Released: 03/23/2011 Document Revised: 03/27/2016 Document Reviewed: 03/27/2016 Elsevier Interactive Patient Education  2018 Elsevier Inc. Edema Edema is when you have too much fluid in your body or under your skin. Edema may make  your legs, feet, and ankles swell up. Swelling is also common in looser tissues, like around your eyes. This is a common condition. It gets more common as you get older. There are many possible causes of edema. Eating too much salt (sodium) and being on your feet or sitting for a long time can cause edema in your legs, feet, and ankles. Hot weather may make edema worse. Edema is usually painless. Your skin may look swollen or shiny. Follow these instructions at home:  Keep the swollen body part raised (elevated) above the level of your heart when you are sitting or lying down.  Do not sit still or stand for a long time.  Do not wear tight clothes. Do not wear garters on your upper legs.  Exercise your legs. This can help the swelling go down.  Wear elastic bandages or support stockings as told by your doctor.  Eat a low-salt (low-sodium) diet to reduce fluid as told by your doctor.  Depending on the cause of your swelling, you may need to limit how much fluid you drink (fluid restriction).  Take over-the-counter and prescription medicines only as told by your doctor. Contact a doctor if:  Treatment is not working.  You have heart, liver, or kidney disease and have symptoms of edema.  You have sudden and unexplained weight gain. Get help right away if:  You have shortness of breath or chest pain.  You cannot breathe when you lie down.  You have pain, redness, or warmth in the swollen areas.  You have heart, liver, or kidney disease and get edema all of a sudden.  You have a fever and your symptoms get worse all of a sudden. Summary  Edema is when you have too much fluid in your body or under your skin.  Edema may make your legs, feet, and ankles swell up. Swelling is also common in looser tissues, like around your eyes.  Raise (elevate) the swollen body part above the level of your heart when you are sitting or lying down.  Follow your doctor's instructions about diet and  how much fluid you can drink (fluid restriction). This information is not intended to replace advice given to you by your health care provider. Make sure you discuss any questions you have with your health care provider. Document Released: 09/20/2007 Document Revised: 04/21/2016 Document Reviewed: 04/21/2016 Elsevier Interactive Patient Education  2017 Elsevier Inc.  

## 2018-02-09 LAB — COMPREHENSIVE METABOLIC PANEL
A/G RATIO: 1.7 (ref 1.2–2.2)
ALBUMIN: 4.3 g/dL (ref 3.5–5.5)
ALK PHOS: 115 IU/L (ref 39–117)
ALT: 34 IU/L (ref 0–44)
AST: 21 IU/L (ref 0–40)
BILIRUBIN TOTAL: 0.2 mg/dL (ref 0.0–1.2)
BUN / CREAT RATIO: 9 (ref 9–20)
BUN: 10 mg/dL (ref 6–20)
CO2: 24 mmol/L (ref 20–29)
Calcium: 9 mg/dL (ref 8.7–10.2)
Chloride: 102 mmol/L (ref 96–106)
Creatinine, Ser: 1.09 mg/dL (ref 0.76–1.27)
GFR, EST AFRICAN AMERICAN: 99 mL/min/{1.73_m2} (ref >59)
GFR, EST NON AFRICAN AMERICAN: 86 mL/min/{1.73_m2} (ref >59)
GLOBULIN, TOTAL: 2.5 g/dL (ref 1.5–4.5)
Glucose: 86 mg/dL (ref 65–99)
POTASSIUM: 4 mmol/L (ref 3.5–5.2)
SODIUM: 142 mmol/L (ref 134–144)
Total Protein: 6.8 g/dL (ref 6.0–8.5)

## 2018-02-09 LAB — TSH: TSH: 2.22 u[IU]/mL (ref 0.450–4.500)

## 2018-02-12 ENCOUNTER — Telehealth (INDEPENDENT_AMBULATORY_CARE_PROVIDER_SITE_OTHER): Payer: Self-pay

## 2018-02-12 NOTE — Telephone Encounter (Signed)
Patient is aware of normal labs. Tempestt S Roberts, CMA  

## 2018-02-12 NOTE — Telephone Encounter (Signed)
-----   Message from Loletta Specter, PA-C sent at 02/11/2018  6:49 PM EDT ----- Labs normal.

## 2018-02-18 MED FILL — OMEPRAZOLE DR 40 MG CAPSULE: 40 | 30 days supply | Qty: 30 | Fill #1

## 2018-02-18 MED FILL — MELOXICAM 15 MG TABLET: 15 | 30 days supply | Qty: 30 | Fill #0

## 2018-02-21 ENCOUNTER — Other Ambulatory Visit: Payer: Self-pay | Admitting: Neurosurgery

## 2018-02-21 ENCOUNTER — Ambulatory Visit (INDEPENDENT_AMBULATORY_CARE_PROVIDER_SITE_OTHER): Payer: Self-pay | Admitting: Physician Assistant

## 2018-02-22 ENCOUNTER — Encounter (INDEPENDENT_AMBULATORY_CARE_PROVIDER_SITE_OTHER): Payer: Self-pay | Admitting: Physician Assistant

## 2018-02-22 ENCOUNTER — Ambulatory Visit (INDEPENDENT_AMBULATORY_CARE_PROVIDER_SITE_OTHER): Payer: Self-pay | Admitting: Physician Assistant

## 2018-02-22 ENCOUNTER — Other Ambulatory Visit: Payer: Self-pay

## 2018-02-22 DIAGNOSIS — M4802 Spinal stenosis, cervical region: Secondary | ICD-10-CM

## 2018-02-22 DIAGNOSIS — M5010 Cervical disc disorder with radiculopathy, unspecified cervical region: Secondary | ICD-10-CM

## 2018-02-22 DIAGNOSIS — M542 Cervicalgia: Secondary | ICD-10-CM

## 2018-02-22 MED ORDER — HYDROCODONE-ACETAMINOPHEN 5-325 MG PO TABS: 1.0000 | ORAL_TABLET | Freq: Every day | ORAL | 0 refills | Status: DC

## 2018-02-22 NOTE — Patient Instructions (Signed)
Acetaminophen; Hydrocodone tablets or capsules What is this medicine? ACETAMINOPHEN; HYDROCODONE (a set a MEE noe fen; hye droe KOE done) is a pain reliever. It is used to treat moderate to severe pain. This medicine may be used for other purposes; ask your health care provider or pharmacist if you have questions. COMMON BRAND NAME(S): Anexsia, Bancap HC, Ceta-Plus, Co-Gesic, Comfortpak, Dolagesic, Dolorex Forte, DuoCet, Hydrocet, Hydrogesic, Lorcet, Lorcet HD, Lorcet Plus, Lortab, Margesic H, Maxidone, Norco, Polygesic, Stagesic, Vanacet, Verdrocet, Vicodin, Vicodin ES, Vicodin HP, Xodol, Zydone What should I tell my health care provider before I take this medicine? They need to know if you have any of these conditions: -brain tumor -Crohn's disease, inflammatory bowel disease, or ulcerative colitis -drug abuse or addiction -head injury -heart or circulation problems -if you often drink alcohol -kidney disease or problems going to the bathroom -liver disease -lung disease, asthma, or breathing problems -an unusual or allergic reaction to acetaminophen, hydrocodone, other opioid analgesics, other medicines, foods, dyes, or preservatives -pregnant or trying to get pregnant -breast-feeding How should I use this medicine? Take this medicine by mouth with a glass of water. Follow the directions on the prescription label. You can take it with or without food. If it upsets your stomach, take it with food. Do not take your medicine more often than directed. A special MedGuide will be given to you by the pharmacist with each prescription and refill. Be sure to read this information carefully each time. Talk to your pediatrician regarding the use of this medicine in children. Special care may be needed. Overdosage: If you think you have taken too much of this medicine contact a poison control center or emergency room at once. NOTE: This medicine is only for you. Do not share this medicine with  others. What if I miss a dose? If you miss a dose, take it as soon as you can. If it is almost time for your next dose, take only that dose. Do not take double or extra doses. What may interact with this medicine? This medicine may interact with the following medications: -alcohol -antiviral medicines for HIV or AIDS -atropine -antihistamines for allergy, cough and cold -certain antibiotics like erythromycin, clarithromycin -certain medicines for anxiety or sleep -certain medicines for bladder problems like oxybutynin, tolterodine -certain medicines for depression like amitriptyline, fluoxetine, sertraline -certain medicines for fungal infections like ketoconazole and itraconazole -certain medicines for Parkinson's disease like benztropine, trihexyphenidyl -certain medicines for seizures like carbamazepine, phenobarbital, phenytoin, primidone -certain medicines for stomach problems like dicyclomine, hyoscyamine -certain medicines for travel sickness like scopolamine -general anesthetics like halothane, isoflurane, methoxyflurane, propofol -ipratropium -local anesthetics like lidocaine, pramoxine, tetracaine -MAOIs like Carbex, Eldepryl, Marplan, Nardil, and Parnate -medicines that relax muscles for surgery -other medicines with acetaminophen -other narcotic medicines for pain or cough -phenothiazines like chlorpromazine, mesoridazine, prochlorperazine, thioridazine -rifampin This list may not describe all possible interactions. Give your health care provider a list of all the medicines, herbs, non-prescription drugs, or dietary supplements you use. Also tell them if you smoke, drink alcohol, or use illegal drugs. Some items may interact with your medicine. What should I watch for while using this medicine? Tell your doctor or health care professional if your pain does not go away, if it gets worse, or if you have new or a different type of pain. You may develop tolerance to the medicine.  Tolerance means that you will need a higher dose of the medicine for pain relief. Tolerance is normal and is expected if   you take the medicine for a long time. Do not suddenly stop taking your medicine because you may develop a severe reaction. Your body becomes used to the medicine. This does NOT mean you are addicted. Addiction is a behavior related to getting and using a drug for a non-medical reason. If you have pain, you have a medical reason to take pain medicine. Your doctor will tell you how much medicine to take. If your doctor wants you to stop the medicine, the dose will be slowly lowered over time to avoid any side effects. There are different types of narcotic medicines (opiates). If you take more than one type at the same time or if you are taking another medicine that also causes drowsiness, you may have more side effects. Give your health care provider a list of all medicines you use. Your doctor will tell you how much medicine to take. Do not take more medicine than directed. Call emergency for help if you have problems breathing or unusual sleepiness. Do not take other medicines that contain acetaminophen with this medicine. Always read labels carefully. If you have questions, ask your doctor or pharmacist. If you take too much acetaminophen get medical help right away. Too much acetaminophen can be very dangerous and cause liver damage. Even if you do not have symptoms, it is important to get help right away. You may get drowsy or dizzy. Do not drive, use machinery, or do anything that needs mental alertness until you know how this medicine affects you. Do not stand or sit up quickly, especially if you are an older patient. This reduces the risk of dizzy or fainting spells. Alcohol may interfere with the effect of this medicine. Avoid alcoholic drinks. The medicine will cause constipation. Try to have a bowel movement at least every 2 to 3 days. If you do not have a bowel movement for 3  days, call your doctor or health care professional. Your mouth may get dry. Chewing sugarless gum or sucking hard candy, and drinking plenty of water may help. Contact your doctor if the problem does not go away or is severe. What side effects may I notice from receiving this medicine? Side effects that you should report to your doctor or health care professional as soon as possible: -allergic reactions like skin rash, itching or hives, swelling of the face, lips, or tongue -breathing problems -confusion -redness, blistering, peeling or loosening of the skin, including inside the mouth -signs and symptoms of low blood pressure like dizziness; feeling faint or lightheaded, falls; unusually weak or tired -trouble passing urine or change in the amount of urine -yellowing of the eyes or skin Side effects that usually do not require medical attention (report to your doctor or health care professional if they continue or are bothersome): -constipation -dry mouth -nausea, vomiting -tiredness This list may not describe all possible side effects. Call your doctor for medical advice about side effects. You may report side effects to FDA at 1-800-FDA-1088. Where should I keep my medicine? Keep out of the reach of children. This medicine can be abused. Keep your medicine in a safe place to protect it from theft. Do not share this medicine with anyone. Selling or giving away this medicine is dangerous and against the law. This medicine may cause accidental overdose and death if it taken by other adults, children, or pets. Mix any unused medicine with a substance like cat litter or coffee grounds. Then throw the medicine away in a sealed container like   a sealed bag or a coffee can with a lid. Do not use the medicine after the expiration date. Store at room temperature between 15 and 30 degrees C (59 and 86 degrees F). NOTE: This sheet is a summary. It may not cover all possible information. If you have  questions about this medicine, talk to your doctor, pharmacist, or health care provider.  2018 Elsevier/Gold Standard (2014-12-25 10:02:16)  

## 2018-02-22 NOTE — Progress Notes (Signed)
Subjective:  Patient ID: Shawn Raymond, male    DOB: Mar 29, 1981  Age: 37 y.o. MRN: [id number]  CC: f/u neck and back pain  HPI Elon Eoff Broadnaxis a 37 y.o.malewith a medical history of cervical radiculopathy, cervical stenosis, asthma, and tobacco disorder presents to f/u on cervicalgia. MR revealed mild annular disc bulge at L4-5 with resultant mild bilateral lateral recess narrowing. Normal cauda equina and conus. Previous MR C spine of 08/25/17 revealed more severe findings. Has been able to acquire health insurance and has gone to reestablish care with [facility]. Scheduled for surgery Apr 02, 2018. Has severe neck pain with radiculopathy and is worse with movement. Continues to use his neck brace. Takes Meloxicam and Robaxin with minimal relief. Uses Norco sparingly. Does not endorse any other symptoms or complaints at this time.      Outpatient Medications Prior to Visit  Medication Sig Dispense Refill  . albuterol (VENTOLIN HFA) 108 (90 Base) MCG/ACT inhaler INHALE 2 PUFFS INTO THE LUNGS EVERY 4 (FOUR) HOURS AS NEEDED FOR WHEEZING OR SHORTNESS OF BREATH. (Patient taking differently: Inhale 2 puffs into the lungs every 4 (four) hours as needed for wheezing or shortness of breath. ) 18 g 11  . budesonide-formoterol (SYMBICORT) 160-4.5 MCG/ACT inhaler Inhale 2 puffs into the lungs 2 (two) times daily. 1 Inhaler 11  . DULoxetine (CYMBALTA) 60 MG capsule Take 1 capsule (60 mg total) by mouth daily. 30 capsule 11  . furosemide (LASIX) 40 MG tablet Take 1 tablet (40 mg total) by mouth daily as needed. 30 tablet 1  . meloxicam (MOBIC) 15 MG tablet TAKE 1 TABLET (15 MG TOTAL) BY MOUTH DAILY. 30 tablet 0  . methocarbamol (ROBAXIN) 500 MG tablet Take 1 tablet (500 mg total) by mouth 3 (three) times daily. 60 tablet 5  . omeprazole (PRILOSEC) 40 MG capsule Take 1 capsule (40 mg total) by mouth daily. 30 capsule 3  . HYDROcodone-acetaminophen (NORCO) 5-325 MG tablet Take 1  tablet by mouth at bedtime. (Patient not taking: Reported on 02/08/2018) 30 tablet 0   No facility-administered medications prior to visit.      ROS Review of Systems  Constitutional: Negative for chills, fever and malaise/fatigue.  Eyes: Negative for blurred vision.  Respiratory: Negative for shortness of breath.   Cardiovascular: Negative for chest pain and palpitations.  Gastrointestinal: Negative for abdominal pain and nausea.  Genitourinary: Negative for dysuria and hematuria.  Musculoskeletal: Positive for neck pain. Negative for joint pain and myalgias.  Skin: Negative for rash.  Neurological: Positive for tingling (Occasionally of the upper extremity bilaterally) and focal weakness (Occasionally of the UE bilaterally). Negative for headaches.  Psychiatric/Behavioral: Negative for depression. The patient is not nervous/anxious.     Objective:  BP 132/76 (BP Location: Left Arm, Patient Position: Sitting, Cuff Size: Large)   Pulse (!) 58   Temp 97.8 F (36.6 C) (Oral)   Ht 5\' 6"  (1.676 m)   Wt 200 lb 3.2 oz (90.8 kg)   SpO2 95%   BMI 32.31 kg/m   BP/Weight 02/22/2018 02/08/2018 01/24/2018  Systolic BP 132 149 118  Diastolic BP 76 105 70  Wt. (Lbs) 200.2 206.8 197.6  BMI 32.31 33.38 31.89      Physical Exam  Constitutional: He is oriented to person, place, and time.  Well developed, overweight, NAD, polite, wearing neck brace.  HENT:  Head: Normocephalic and atraumatic.  Eyes: No scleral icterus.  Neck: Normal range of motion. Neck supple. No thyromegaly present.  Cardiovascular: Normal rate, regular rhythm and normal heart sounds.  Pulmonary/Chest: Effort normal and breath sounds normal.  Musculoskeletal: He exhibits no edema.  Neurological: He is alert and oriented to person, place, and time.  Strength 5/5 of the UE bilaterally, normal gait.   Skin: Skin is warm and dry. No rash noted. No erythema. No pallor.  Psychiatric: He has a normal mood and affect. His  behavior is normal. Thought content normal.  Vitals reviewed.    Assessment & Plan:     1. Cervical disc disorder with radiculopathy of cervical region - HYDROcodone-acetaminophen (NORCO) 5-325 MG tablet; Take 1 tablet by mouth at bedtime.  Dispense: 30 tablet; Refill: 0  2. Spinal stenosis in cervical region - HYDROcodone-acetaminophen (NORCO) 5-325 MG tablet; Take 1 tablet by mouth at bedtime.  Dispense: 30 tablet; Refill: 0  3. Cervicalgia - HYDROcodone-acetaminophen (NORCO) 5-325 MG tablet; Take 1 tablet by mouth at bedtime.  Dispense: 30 tablet; Refill: 0   Meds ordered this encounter  Medications  . HYDROcodone-acetaminophen (NORCO) 5-325 MG tablet    Sig: Take 1 tablet by mouth at bedtime.    Dispense:  30 tablet    Refill:  0    Order Specific Question:   Supervising Provider    Answer:   Hoy Register [[id number]]    Follow-up: Return if symptoms worsen or fail to improve.   Loletta Specter PA

## 2018-02-27 ENCOUNTER — Ambulatory Visit (HOSPITAL_COMMUNITY)
Admission: EM | Admit: 2018-02-27 | Discharge: 2018-02-27 | Disposition: A | Discharge status: Home / Self Care | Payer: Self-pay | Attending: Family Medicine | Admitting: Family Medicine

## 2018-02-27 ENCOUNTER — Encounter (HOSPITAL_COMMUNITY): Payer: Self-pay | Admitting: Emergency Medicine

## 2018-02-27 DIAGNOSIS — K1370 Unspecified lesions of oral mucosa: Secondary | ICD-10-CM

## 2018-02-27 MED ORDER — CHLORHEXIDINE GLUCONATE 0.12 % MT SOLN: 15.0000 mL | Freq: Two times a day (BID) | OROMUCOSAL | 0 refills | Status: DC

## 2018-02-27 NOTE — ED Triage Notes (Signed)
Pt states theres a while film inside his mouth x1 week. Denies pain.

## 2018-02-27 NOTE — Discharge Instructions (Signed)
No alarming sign on exam. Peridex for now for oral hygiene. Follow up with dentist/PCP for further monitoring and management needed.

## 2018-02-27 NOTE — ED Provider Notes (Signed)
[facility]    CSN: [id number] Arrival date & time: 02/27/18  1212     History   Chief Complaint Chief Complaint  Patient presents with  . Mouth Lesions    HPI Shawn Raymond is a 37 y.o. male.   37 year old male comes in with 1 week history of oral lesion/white film to the mouth. Denies pain, irritation, swelling. Denies dental pain, gum pain. States has some irritation near his wisdom tooth of the right jaw, possibly from scraping/biting. Otherwise, no obvious injury/trauma. Current every day smoker.      Past Medical History:  Diagnosis Date  . Asthma   . Sarcoidosis     Patient Active Problem List   Diagnosis Date Noted  . Cervical disc disorder with radiculopathy of cervical region 01/25/2018  . Spinal stenosis in cervical region 01/25/2018  . Cervicalgia 01/25/2018  . Heartburn 01/25/2018  . FRACTURE, ANKLE, RIGHT 08/09/2009  . ANKLE SPRAIN, RIGHT 08/09/2009  . COCAINE ABUSE 08/03/2009  . TOBACCO ABUSE 08/02/2009  . 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 INTRINSIC ASTHMA, UNSPECIFIED 03/23/2009  . Nonspecific (abnormal) findings on radiological and other examination of body structure 01/07/2009  . CT, CHEST, ABNORMAL 01/07/2009    Past Surgical History:  Procedure Laterality Date  . FEMUR FRACTURE SURGERY    . PATELLA RECONSTRUCTION         Home Medications    Prior to Admission medications   Medication Sig Start Date End Date Taking? Authorizing Provider  albuterol (VENTOLIN HFA) 108 (90 Base) MCG/ACT inhaler INHALE 2 PUFFS INTO THE LUNGS EVERY 4 (FOUR) HOURS AS NEEDED FOR WHEEZING OR SHORTNESS OF BREATH. Patient taking differently: Inhale 2 puffs into the lungs every 4 (four) hours as needed for wheezing or shortness of breath.  11/08/17   Loletta Specter, PA-C  budesonide-formoterol [facility]) 160-4.5 MCG/ACT inhaler Inhale 2 puffs into the lungs 2 (two) times daily. 11/08/17   Loletta Specter, PA-C  chlorhexidine (PERIDEX)  0.12 % solution Use as directed 15 mLs in the mouth or throat 2 (two) times daily. 02/27/18   Belinda Fisher, PA-C  HYDROcodone-acetaminophen (NORCO) 5-325 MG tablet Take 1 tablet by mouth at bedtime. 02/22/18   Loletta Specter, PA-C  meloxicam (MOBIC) 15 MG tablet TAKE 1 TABLET (15 MG TOTAL) BY MOUTH DAILY. 02/06/18   Loletta Specter, PA-C  methocarbamol (ROBAXIN) 500 MG tablet Take 1 tablet (500 mg total) by mouth 3 (three) times daily. 11/08/17   Loletta Specter, PA-C  omeprazole (PRILOSEC) 40 MG capsule Take 1 capsule (40 mg total) by mouth daily. 01/24/18   Loletta Specter, PA-C    Family History Family History  Problem Relation Age of Onset  . Healthy Mother   . Healthy Father     Social History Social History   Tobacco Use  . Smoking status: Current Every Day Smoker    Packs/day: 0.70    Types: Cigarettes  . Smokeless tobacco: Never Used  . Tobacco comment: patient has been  without a cigarette since 12/27/17  Substance Use Topics  . Alcohol use: No  . Drug use: No     Allergies   Shellfish allergy   Review of Systems Review of Systems  Reason unable to perform ROS: See HPI as above.     Physical Exam Triage Vital Signs ED Triage Vitals  Enc Vitals Group     BP 02/27/18 1236 (!) 142/97     Pulse Rate 02/27/18 1236 71  Resp 02/27/18 1236 18     Temp 02/27/18 1236 98.6 F (37 C)     Temp src --      SpO2 02/27/18 1236 99 %     Weight --      Height --      Head Circumference --      Peak Flow --      Pain Score 02/27/18 1238 0     Pain Loc --      Pain Edu? --      Excl. in GC? --    No data found.  Updated Vital Signs BP (!) 142/97   Pulse 71   Temp 98.6 F (37 C)   Resp 18   SpO2 99%   Visual Acuity Right Eye Distance:   Left Eye Distance:   Bilateral Distance:    Right Eye Near:   Left Eye Near:    Bilateral Near:     Physical Exam  Constitutional: He is oriented to person, place, and time. He appears well-developed and  well-nourished. No distress.  HENT:  Head: Normocephalic and atraumatic.  Mouth/Throat: Uvula is midline, oropharynx is clear and moist and mucous membranes are normal. No trismus in the jaw. No uvula swelling. No tonsillar exudate.  No tenderness to palpation of the gum/teeth. No obvious fluctuance felt. Floor of mouth soft to palpation. No obvious facial swelling.   White line on right back cheek, more likely due to injury from biting down.  See picture below. No tenderness. Unable to scrape off.   Neck: Normal range of motion. Neck supple.  Neurological: He is alert and oriented to person, place, and time.  Skin: Skin is warm and dry. He is not diaphoretic.       UC Treatments / Results  Labs (all labs ordered are listed, but only abnormal results are displayed) Labs Reviewed - No data to display  EKG None  Radiology No results found.  Procedures Procedures (including critical care time)  Medications Ordered in UC Medications - No data to display  Initial Impression / Assessment and Plan / UC Course  I have reviewed the triage vital signs and the nursing notes.  Pertinent labs & imaging results that were available during my care of the patient were reviewed by me and considered in my medical decision making (see chart for details).    Discussed case with Dr Tracie HarrierHagler. Will have patient use peridex for oral hygiene. Continue to monitor and follow up with dentist for further evaluation needed if lesions do not resolve.  Final Clinical Impressions(s) / UC Diagnoses   Final diagnoses:  Mouth lesion    ED Prescriptions    Medication Sig Dispense Auth. Provider   chlorhexidine (PERIDEX) 0.12 % solution Use as directed 15 mLs in the mouth or throat 2 (two) times daily. 120 mL Threasa AlphaYu, Nayda Riesen V, PA-C        Emlyn Maves V, [place]PA-C 02/27/18 1315

## 2018-03-12 ENCOUNTER — Other Ambulatory Visit (INDEPENDENT_AMBULATORY_CARE_PROVIDER_SITE_OTHER): Payer: Self-pay | Admitting: Physician Assistant

## 2018-03-12 DIAGNOSIS — M542 Cervicalgia: Secondary | ICD-10-CM

## 2018-03-12 DIAGNOSIS — M5010 Cervical disc disorder with radiculopathy, unspecified cervical region: Secondary | ICD-10-CM

## 2018-03-12 DIAGNOSIS — M4802 Spinal stenosis, cervical region: Secondary | ICD-10-CM

## 2018-03-12 MED FILL — $VENTOLIN HFA 18G INHALER: 108 (90 BAS | 75 days supply | Qty: 54 | Fill #0

## 2018-03-12 MED FILL — OMEPRAZOLE DR 40 MG CAPSULE: 40 | 30 days supply | Qty: 30 | Fill #2

## 2018-03-12 MED FILL — METHOCARBAMOL 500 MG TABS: 500 | 20 days supply | Qty: 60 | Fill #4

## 2018-03-12 MED FILL — SYMBICORT 160-4.5 MCG INH: 160-4.5 | 30 days supply | Qty: 10 | Fill #4

## 2018-03-13 NOTE — Telephone Encounter (Signed)
FWD to PCP. Shayla Heming S Mazel Villela, CMA  

## 2018-03-22 NOTE — Pre-Procedure Instructions (Signed)
Shawn Raymond  03/22/2018      [facility] [place] [place] [address] Phone: [phone] Fax: [phone]  [facility] [address] [place], [place] - [address] [place] [address] Phone: [phone] Fax: [phone]    Your procedure is scheduled on December 17th.  [facility].M.  Call this number if you have problems the morning of surgery:  [phone]   Remember:  Do not eat or drink after midnight.    Take these medicines the morning of surgery with A SIP OF WATER  albuterol (VENTOLIN HFA) if needed. Bring inhaler with you. budesonide-formoterol (SYMBICORT) omeprazole (PRILOSEC) methocarbamol (ROBAXIN)   7 days prior to surgery STOP taking any meloxicam (MOBIC) , Aspirin(unless otherwise instructed by your surgeon), Aleve, Naproxen, Ibuprofen, Motrin, Advil, Goody's, BC's, all herbal medications, fish oil, and all vitamins     Do not wear jewelry.  Do not wear lotions, powders, or colognes, or deodorant.  Men may shave face and neck.  Do not bring valuables to the hospital.  [facility] is not responsible for any belongings or valuables.  Contacts, dentures or bridgework may not be worn into surgery.  Leave your suitcase in the car.  After surgery it may be brought to your room.  For patients admitted to the hospital, discharge time will be determined by your treatment team.  Patients discharged the day of surgery will not be allowed to drive home.    [facility]- Preparing For Surgery  Before surgery, you can play an important role. Because skin is not sterile, your skin needs to be as free of germs as possible. You can reduce the number of germs on your skin by washing with CHG (chlorahexidine gluconate) Soap before surgery.  CHG is an antiseptic cleaner which kills germs and bonds with the skin to  continue killing germs even after washing.    Oral Hygiene is also important to reduce your risk of infection.  Remember - BRUSH YOUR TEETH THE MORNING OF SURGERY WITH YOUR REGULAR TOOTHPASTE  Please do not use if you have an allergy to CHG or antibacterial soaps. If your skin becomes reddened/irritated stop using the CHG.  Do not shave (including legs and underarms) for at least 48 hours prior to first CHG shower. It is OK to shave your face.  Please follow these instructions carefully.   1. Shower the NIGHT BEFORE SURGERY and the MORNING OF SURGERY with CHG.   2. If you chose to wash your hair, wash your hair first as usual with your normal shampoo.  3. After you shampoo, rinse your hair and body thoroughly to remove the shampoo.  4. Use CHG as you would any other liquid soap. You can apply CHG directly to the skin and wash gently with a scrungie or a clean washcloth.   5. Apply the CHG Soap to your body ONLY FROM THE NECK DOWN.  Do not use on open wounds or open sores. Avoid contact with your eyes, ears, mouth and genitals (private parts). Wash Face and genitals (private parts)  with your normal soap.  6. Wash thoroughly, paying special attention to the area where your surgery will be performed.  7. Thoroughly rinse your body with warm water from the neck down.  8. DO NOT shower/wash with your normal soap after using and rinsing off the CHG Soap.  9. Pat yourself dry with a CLEAN TOWEL.  10. Wear CLEAN PAJAMAS to bed the night before surgery, wear comfortable clothes the morning of surgery  11. Place CLEAN SHEETS on your bed the night of your first shower and DO NOT SLEEP WITH PETS.    Day of Surgery:  Do not apply any deodorants/lotions.  Please wear clean clothes to the hospital/surgery center.   Remember to brush your teeth WITH YOUR REGULAR TOOTHPASTE.    Please read over the following fact sheets that you were given.

## 2018-03-25 ENCOUNTER — Encounter (HOSPITAL_COMMUNITY)
Admission: RE | Admit: 2018-03-25 | Discharge: 2018-03-25 | Disposition: A | Discharge status: Home / Self Care | Payer: Self-pay | Source: Ambulatory Visit | Attending: Neurosurgery | Admitting: Neurosurgery

## 2018-03-25 ENCOUNTER — Encounter (HOSPITAL_COMMUNITY): Payer: Self-pay

## 2018-03-25 ENCOUNTER — Other Ambulatory Visit: Payer: Self-pay

## 2018-03-25 DIAGNOSIS — Z01812 Encounter for preprocedural laboratory examination: Secondary | ICD-10-CM | POA: Insufficient documentation

## 2018-03-25 LAB — CBC
HCT: 45.2 % (ref 39.0–52.0)
HEMOGLOBIN: 14.2 g/dL (ref 13.0–17.0)
MCH: 28.6 pg (ref 26.0–34.0)
MCHC: 31.4 g/dL (ref 30.0–36.0)
MCV: 91.1 fL (ref 80.0–100.0)
Platelets: 242 10*3/uL (ref 150–400)
RBC: 4.96 MIL/uL (ref 4.22–5.81)
RDW: 12.8 % (ref 11.5–15.5)
WBC: 11.1 10*3/uL — ABNORMAL HIGH (ref 4.0–10.5)
nRBC: 0 % (ref 0.0–0.2)

## 2018-03-25 LAB — SURGICAL PCR SCREEN
MRSA, PCR: NEGATIVE
Staphylococcus aureus: NEGATIVE

## 2018-03-25 LAB — TYPE AND SCREEN
ABO/RH(D): O POS
Antibody Screen: NEGATIVE

## 2018-03-25 LAB — ABO/RH: ABO/RH(D): O POS

## 2018-03-25 NOTE — Progress Notes (Signed)
PCP - Dr. Sindy Messingoger Gomez Cardiologist - denies  Chest x-ray - N/A EKG - N/A Stress Test -5+ years  ECHO - denies Cardiac Cath - denies  Sleep Study - denies  Aspirin Instructions:denies  Anesthesia review: No  Patient denies shortness of breath, fever, cough and chest pain at PAT appointment   Patient verbalized understanding of instructions that were given to them at the PAT appointment. Patient was also instructed that they will need to review over the PAT instructions again at home before surgery.

## 2018-03-26 ENCOUNTER — Other Ambulatory Visit: Payer: Self-pay | Admitting: Neurosurgery

## 2018-04-01 NOTE — H&P (Signed)
Chief Complaint   Neck pain  HPI   HPI: Shawn Raymond is a 37 y.o. male with neck pain & worsening generalized weakness.  Symptoms stem back to January 2019 when he started developing some neck pain and some lower back pain.  It was mild in severity and he was treating it with over-the-counter medications and thought nothing of it.  The pain progressed in February and he started to develop some numbness that started in his fingers and radiated up his bilateral arms.  It was also associated with some weakness in his legs.  When ambulating, he describes it as feeling like a baby deer with wobbly legs.  He has also noticed diffuse muscle atrophy over the last several months. She underwent an MRI of his cervical spine and was found to have severe cervical stenosis.  He presents today for surgery.  He is without any concerns.   Patient Active Problem List   Diagnosis Date Noted  . Cervical disc disorder with radiculopathy of cervical region 01/25/2018  . Spinal stenosis in cervical region 01/25/2018  . Cervicalgia 01/25/2018  . Heartburn 01/25/2018  . FRACTURE, ANKLE, RIGHT 08/09/2009  . ANKLE SPRAIN, RIGHT 08/09/2009  . COCAINE ABUSE 08/03/2009  . TOBACCO ABUSE 08/02/2009  . 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 INTRINSIC ASTHMA, UNSPECIFIED 03/23/2009  . Nonspecific (abnormal) findings on radiological and other examination of body structure 01/07/2009  . CT, CHEST, ABNORMAL 01/07/2009    PMH: Past Medical History:  Diagnosis Date  . Asthma   . Sarcoidosis     PSH: Past Surgical History:  Procedure Laterality Date  . FEMUR FRACTURE SURGERY    . PATELLA RECONSTRUCTION      No medications prior to admission.    SH: Social History   Tobacco Use  . Smoking status: Former Smoker    Packs/day: 0.70    Types: Cigarettes    Last attempt to quit: 12/24/2017    Years since quitting: 0.2  . Smokeless tobacco: Never Used  . Tobacco comment: patient has been  without a  cigarette since 12/27/17  Substance Use Topics  . Alcohol use: No  . Drug use: Yes    Types: Marijuana    Comment: 3grams a day.     MEDS: Prior to Admission medications   Medication Sig Start Date End Date Taking? Authorizing Provider  albuterol (VENTOLIN HFA) 108 (90 Base) MCG/ACT inhaler INHALE 2 PUFFS INTO THE LUNGS EVERY 4 (FOUR) HOURS AS NEEDED FOR WHEEZING OR SHORTNESS OF BREATH. Patient taking differently: Inhale 2 puffs into the lungs every 4 (four) hours as needed for wheezing or shortness of breath.  11/08/17  Yes Loletta Specter, PA-C  budesonide-formoterol [facility]) 160-4.5 MCG/ACT inhaler Inhale 2 puffs into the lungs 2 (two) times daily. 11/08/17  Yes Loletta Specter, PA-C  HYDROcodone-acetaminophen [facility]) 5-325 MG tablet Take 1 tablet by mouth at bedtime. 02/22/18  Yes Loletta Specter, PA-C  meloxicam (MOBIC) 15 MG tablet TAKE 1 TABLET (15 MG TOTAL) BY MOUTH DAILY. 03/13/18  Yes Loletta Specter, PA-C  methocarbamol (ROBAXIN) 500 MG tablet Take 1 tablet (500 mg total) by mouth 3 (three) times daily. 11/08/17  Yes Loletta Specter, PA-C  omeprazole (PRILOSEC) 40 MG capsule Take 1 capsule (40 mg total) by mouth daily. 01/24/18  Yes Loletta Specter, PA-C  chlorhexidine (PERIDEX) 0.12 % solution Use as directed 15 mLs in the mouth or throat 2 (two) times daily. Patient not taking: Reported on 03/21/2018 02/27/18  Cathie HoopsYu, Amy V, PA-C    ALLERGY: Allergies  Allergen Reactions  . Shellfish Allergy Anaphylaxis    Social History   Tobacco Use  . Smoking status: Former Smoker    Packs/day: 0.70    Types: Cigarettes    Last attempt to quit: 12/24/2017    Years since quitting: 0.2  . Smokeless tobacco: Never Used  . Tobacco comment: patient has been  without a cigarette since 12/27/17  Substance Use Topics  . Alcohol use: No     Family History  Problem Relation Age of Onset  . Healthy Mother   . Healthy Father      ROS   ROS  Exam   There were no  vitals filed for this visit. General appearance: WDWN, NAD Eyes: No scleral injection Cardiovascular: Regular rate and rhythm without murmurs, rubs, gallops. No edema or variciosities. Distal pulses normal. Pulmonary: Effort normal, non-labored breathing Musculoskeletal:     Muscle tone upper extremities: Normal    Muscle tone lower extremities: Normal    Motor exam:  Right Left Deltoid: 4+/5  Biceps: 4/5 4+/5 Triceps: 4/5  Infraspinatus: 4/5 4/5 Wrist Extensor: 4/5  Grip: 4-/5 4+/5 Hip Flexor:  3/5 Knee Extensor:  4-/5 Knee Flexor:  4-/5   Neurological Mental Status:    - Patient is awake, alert, oriented to person, place, month, year, and situation    - Patient is able to give a clear and coherent history.    - No signs of aphasia or neglect Cranial Nerves    - II: Visual Fields are full. PERRL    - III/IV/VI: EOMI without ptosis or diploplia.     - V: Facial sensation is grossly normal    - VII: Facial movement is symmetric.     - VIII: hearing is intact to voice    - X: Uvula elevates symmetrically    - XI: Shoulder shrug is symmetric.    - XII: tongue is midline without atrophy or fasciculations.  Sensory: Sensation grossly intact to LT Deep Tendon Reflexes    - 3+ in the biceps and patellae.    Results - Imaging/Labs   No results found for this or any previous visit (from the past 48 hour(s)).  No results found.  IMAGING: MRI of the cervical spine was reviewed.  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  There is superimposed fairly large slightly left eccentric disc herniation at C6-7, with associated T2 signal change within the cord at about the C7 vertebral body level.    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 we will plan on proceeding with decompression of the cervical stenosis via C3 through C7 laminectomy with placement of lateral mass screws for posterolateral arthrodesis followed by C6-7 ACDF.   While in the office the risks of surgery which include but are not limited to nerve/spinal cord injury leading to arm or leg weakness, numbness, paralysis and/or bowel and bladder dysfunction, CSF leak, bleeding, and infection.  Also we discussed possibility of postoperative dysphagia, dysphonia, neck hematoma, or subsequent surgery for epidural hematoma.  In addition, I explained to him that after spinal fusion surgery, there is a risk of adjacent level disease requiring future surgical intervention. The possibility of continued/worsening  pain or weakness after surgery was also discussed. The general risks of anesthesia were also reviewed including heart attack, stroke, and DVT/PE.    The patient understood our discussion as well as the risks of the surgery and is willing to proceed.  All questions were answered.

## 2018-04-02 ENCOUNTER — Inpatient Hospital Stay (HOSPITAL_COMMUNITY): Payer: No Typology Code available for payment source

## 2018-04-02 ENCOUNTER — Inpatient Hospital Stay (HOSPITAL_COMMUNITY): Payer: No Typology Code available for payment source | Admitting: Anesthesiology

## 2018-04-02 ENCOUNTER — Inpatient Hospital Stay (HOSPITAL_COMMUNITY)
Admission: RE | Admit: 2018-04-02 | Discharge: 2018-04-03 | DRG: 454 | Disposition: A | Discharge status: Home / Self Care | Payer: No Typology Code available for payment source | Attending: Neurosurgery | Admitting: Neurosurgery

## 2018-04-02 ENCOUNTER — Encounter (HOSPITAL_COMMUNITY): Payer: Self-pay | Admitting: General Practice

## 2018-04-02 ENCOUNTER — Encounter (HOSPITAL_COMMUNITY)
Admission: RE | Disposition: A | Discharge status: Home / Self Care | Payer: Self-pay | Source: Home / Self Care | Attending: Neurosurgery

## 2018-04-02 ENCOUNTER — Other Ambulatory Visit: Payer: Self-pay

## 2018-04-02 DIAGNOSIS — Z91013 Allergy to seafood: Secondary | ICD-10-CM

## 2018-04-02 DIAGNOSIS — J45909 Unspecified asthma, uncomplicated: Secondary | ICD-10-CM | POA: Diagnosis present

## 2018-04-02 DIAGNOSIS — M50123 Cervical disc disorder at C6-C7 level with radiculopathy: Secondary | ICD-10-CM | POA: Diagnosis present

## 2018-04-02 DIAGNOSIS — Z419 Encounter for procedure for purposes other than remedying health state, unspecified: Secondary | ICD-10-CM

## 2018-04-02 DIAGNOSIS — G992 Myelopathy in diseases classified elsewhere: Secondary | ICD-10-CM | POA: Diagnosis present

## 2018-04-02 DIAGNOSIS — Z7951 Long term (current) use of inhaled steroids: Secondary | ICD-10-CM

## 2018-04-02 DIAGNOSIS — Z87891 Personal history of nicotine dependence: Secondary | ICD-10-CM

## 2018-04-02 DIAGNOSIS — Z791 Long term (current) use of non-steroidal anti-inflammatories (NSAID): Secondary | ICD-10-CM

## 2018-04-02 DIAGNOSIS — D860 Sarcoidosis of lung: Secondary | ICD-10-CM | POA: Diagnosis present

## 2018-04-02 DIAGNOSIS — Q675 Congenital deformity of spine: Secondary | ICD-10-CM

## 2018-04-02 DIAGNOSIS — M2578 Osteophyte, vertebrae: Secondary | ICD-10-CM | POA: Diagnosis present

## 2018-04-02 DIAGNOSIS — M4802 Spinal stenosis, cervical region: Principal | ICD-10-CM | POA: Diagnosis present

## 2018-04-02 DIAGNOSIS — Z79899 Other long term (current) drug therapy: Secondary | ICD-10-CM

## 2018-04-02 DIAGNOSIS — M5412 Radiculopathy, cervical region: Secondary | ICD-10-CM

## 2018-04-02 HISTORY — PX: POSTERIOR CERVICAL FUSION/FORAMINOTOMY: SHX5038

## 2018-04-02 HISTORY — PX: ANTERIOR CERVICAL DECOMP/DISCECTOMY FUSION: SHX1161

## 2018-04-02 SURGERY — POSTERIOR CERVICAL FUSION/FORAMINOTOMY LEVEL 4
Anesthesia: General | Site: Neck

## 2018-04-02 MED ORDER — CEFAZOLIN SODIUM-DEXTROSE 2-4 GM/100ML-% IV SOLN
2.0000 g | Freq: Three times a day (TID) | INTRAVENOUS | Status: AC
  Administered 2018-04-02 – 2018-04-03 (×2): 2 g via INTRAVENOUS
  Filled 2018-04-02 (×2): qty 100

## 2018-04-02 MED ORDER — ARTIFICIAL TEARS OPHTHALMIC OINT
TOPICAL_OINTMENT | OPHTHALMIC | Status: AC
  Filled 2018-04-02: qty 3.5

## 2018-04-02 MED ORDER — DEXAMETHASONE SODIUM PHOSPHATE 10 MG/ML IJ SOLN
INTRAMUSCULAR | Status: DC | PRN
  Administered 2018-04-02: 10 mg via INTRAVENOUS

## 2018-04-02 MED ORDER — SENNOSIDES-DOCUSATE SODIUM 8.6-50 MG PO TABS: 1.0000 | ORAL_TABLET | Freq: Every evening | ORAL | Status: DC | PRN

## 2018-04-02 MED ORDER — HYDROMORPHONE HCL 1 MG/ML IJ SOLN
INTRAMUSCULAR | Status: AC
  Filled 2018-04-02: qty 1

## 2018-04-02 MED ORDER — HYDROCODONE-ACETAMINOPHEN 5-325 MG PO TABS: 1.0000 | ORAL_TABLET | ORAL | Status: DC | PRN

## 2018-04-02 MED ORDER — METHOCARBAMOL 1000 MG/10ML IJ SOLN
500.0000 mg | Freq: Four times a day (QID) | INTRAVENOUS | Status: DC | PRN
  Filled 2018-04-02: qty 5

## 2018-04-02 MED ORDER — ACETAMINOPHEN 500 MG PO TABS
1000.0000 mg | ORAL_TABLET | Freq: Four times a day (QID) | ORAL | Status: DC
  Administered 2018-04-02 – 2018-04-03 (×3): 1000 mg via ORAL
  Filled 2018-04-02 (×3): qty 2

## 2018-04-02 MED ORDER — LIDOCAINE 2% (20 MG/ML) 5 ML SYRINGE
INTRAMUSCULAR | Status: DC | PRN
  Administered 2018-04-02: 100 mg via INTRAVENOUS

## 2018-04-02 MED ORDER — FENTANYL CITRATE (PF) 250 MCG/5ML IJ SOLN
INTRAMUSCULAR | Status: AC
  Filled 2018-04-02: qty 5

## 2018-04-02 MED ORDER — FLEET ENEMA 7-19 GM/118ML RE ENEM: 1.0000 | ENEMA | Freq: Once | RECTAL | Status: DC | PRN

## 2018-04-02 MED ORDER — LACTATED RINGERS IV SOLN
INTRAVENOUS | Status: DC | PRN
  Administered 2018-04-02 (×2): via INTRAVENOUS

## 2018-04-02 MED ORDER — BISACODYL 10 MG RE SUPP: 10.0000 mg | Freq: Every day | RECTAL | Status: DC | PRN

## 2018-04-02 MED ORDER — METHOCARBAMOL 500 MG PO TABS
ORAL_TABLET | ORAL | Status: AC
  Filled 2018-04-02: qty 1

## 2018-04-02 MED ORDER — ONDANSETRON HCL 4 MG/2ML IJ SOLN
INTRAMUSCULAR | Status: AC
  Filled 2018-04-02: qty 2

## 2018-04-02 MED ORDER — 0.9 % SODIUM CHLORIDE (POUR BTL) OPTIME
TOPICAL | Status: DC | PRN
  Administered 2018-04-02: 1000 mL

## 2018-04-02 MED ORDER — FENTANYL CITRATE (PF) 250 MCG/5ML IJ SOLN
INTRAMUSCULAR | Status: DC | PRN
  Administered 2018-04-02 (×3): 100 ug via INTRAVENOUS
  Administered 2018-04-02: 50 ug via INTRAVENOUS
  Administered 2018-04-02: 100 ug via INTRAVENOUS
  Administered 2018-04-02: 150 ug via INTRAVENOUS

## 2018-04-02 MED ORDER — MIDAZOLAM HCL 2 MG/2ML IJ SOLN
INTRAMUSCULAR | Status: AC
  Filled 2018-04-02: qty 2

## 2018-04-02 MED ORDER — BACITRACIN ZINC 500 UNIT/GM EX OINT
TOPICAL_OINTMENT | CUTANEOUS | Status: AC
  Filled 2018-04-02: qty 28.35

## 2018-04-02 MED ORDER — SUCCINYLCHOLINE CHLORIDE 20 MG/ML IJ SOLN
INTRAMUSCULAR | Status: DC | PRN
  Administered 2018-04-02: 100 mg via INTRAVENOUS

## 2018-04-02 MED ORDER — ACETAMINOPHEN 325 MG PO TABS: 650.0000 mg | ORAL_TABLET | ORAL | Status: DC | PRN

## 2018-04-02 MED ORDER — THROMBIN 5000 UNITS EX SOLR
CUTANEOUS | Status: AC
  Filled 2018-04-02: qty 5000

## 2018-04-02 MED ORDER — DEXAMETHASONE SODIUM PHOSPHATE 10 MG/ML IJ SOLN
INTRAMUSCULAR | Status: AC
  Filled 2018-04-02: qty 1

## 2018-04-02 MED ORDER — HYDROMORPHONE HCL 1 MG/ML IJ SOLN
0.5000 mg | INTRAMUSCULAR | Status: DC | PRN
  Administered 2018-04-02 (×2): 1 mg via INTRAVENOUS
  Filled 2018-04-02 (×2): qty 1

## 2018-04-02 MED ORDER — LIDOCAINE 2% (20 MG/ML) 5 ML SYRINGE
INTRAMUSCULAR | Status: AC
  Filled 2018-04-02: qty 5

## 2018-04-02 MED ORDER — CHLORHEXIDINE GLUCONATE CLOTH 2 % EX PADS: 6.0000 | MEDICATED_PAD | Freq: Once | CUTANEOUS | Status: DC

## 2018-04-02 MED ORDER — SUGAMMADEX SODIUM 500 MG/5ML IV SOLN
INTRAVENOUS | Status: AC
  Filled 2018-04-02: qty 5

## 2018-04-02 MED ORDER — ACETAMINOPHEN 650 MG RE SUPP: 650.0000 mg | RECTAL | Status: DC | PRN

## 2018-04-02 MED ORDER — SODIUM CHLORIDE 0.9% FLUSH
3.0000 mL | Freq: Two times a day (BID) | INTRAVENOUS | Status: DC
  Administered 2018-04-02: 3 mL via INTRAVENOUS

## 2018-04-02 MED ORDER — SODIUM CHLORIDE 0.9 % IV SOLN
INTRAVENOUS | Status: DC | PRN
  Administered 2018-04-02: 20 ug/min via INTRAVENOUS

## 2018-04-02 MED ORDER — BUPIVACAINE HCL 0.5 % IJ SOLN
INTRAMUSCULAR | Status: DC | PRN
  Administered 2018-04-02 (×2): 5 mL

## 2018-04-02 MED ORDER — SODIUM CHLORIDE 0.9 % IV SOLN
INTRAVENOUS | Status: DC | PRN
  Administered 2018-04-02: 10:00:00

## 2018-04-02 MED ORDER — ROCURONIUM BROMIDE 10 MG/ML (PF) SYRINGE
PREFILLED_SYRINGE | INTRAVENOUS | Status: DC | PRN
  Administered 2018-04-02: 50 mg via INTRAVENOUS
  Administered 2018-04-02: 20 mg via INTRAVENOUS
  Administered 2018-04-02: 50 mg via INTRAVENOUS
  Administered 2018-04-02: 30 mg via INTRAVENOUS

## 2018-04-02 MED ORDER — PROPOFOL 10 MG/ML IV BOLUS
INTRAVENOUS | Status: AC
  Filled 2018-04-02: qty 20

## 2018-04-02 MED ORDER — CEFAZOLIN SODIUM-DEXTROSE 2-4 GM/100ML-% IV SOLN
2.0000 g | INTRAVENOUS | Status: AC
  Administered 2018-04-02 (×2): 2 g via INTRAVENOUS

## 2018-04-02 MED ORDER — MENTHOL 3 MG MT LOZG: 1.0000 | LOZENGE | OROMUCOSAL | Status: DC | PRN

## 2018-04-02 MED ORDER — BUPIVACAINE HCL (PF) 0.5 % IJ SOLN
INTRAMUSCULAR | Status: AC
  Filled 2018-04-02: qty 30

## 2018-04-02 MED ORDER — PANTOPRAZOLE SODIUM 40 MG PO TBEC
80.0000 mg | DELAYED_RELEASE_TABLET | Freq: Every day | ORAL | Status: DC
  Administered 2018-04-02 – 2018-04-03 (×2): 80 mg via ORAL
  Filled 2018-04-02 (×2): qty 2

## 2018-04-02 MED ORDER — LIDOCAINE-EPINEPHRINE 1 %-1:100000 IJ SOLN
INTRAMUSCULAR | Status: DC | PRN
  Administered 2018-04-02 (×2): 5 mL

## 2018-04-02 MED ORDER — PROPOFOL 10 MG/ML IV BOLUS
INTRAVENOUS | Status: DC | PRN
  Administered 2018-04-02: 200 mg via INTRAVENOUS

## 2018-04-02 MED ORDER — OXYCODONE HCL 5 MG PO TABS
5.0000 mg | ORAL_TABLET | ORAL | Status: DC | PRN
  Administered 2018-04-02 – 2018-04-03 (×6): 10 mg via ORAL
  Filled 2018-04-02 (×5): qty 2

## 2018-04-02 MED ORDER — BACITRACIN ZINC 500 UNIT/GM EX OINT
TOPICAL_OINTMENT | CUTANEOUS | Status: DC | PRN
  Administered 2018-04-02 (×2): 1 via TOPICAL

## 2018-04-02 MED ORDER — GABAPENTIN 300 MG PO CAPS
300.0000 mg | ORAL_CAPSULE | Freq: Three times a day (TID) | ORAL | Status: DC
  Administered 2018-04-02 – 2018-04-03 (×3): 300 mg via ORAL
  Filled 2018-04-02 (×3): qty 1

## 2018-04-02 MED ORDER — THROMBIN 20000 UNITS EX SOLR
CUTANEOUS | Status: DC | PRN
  Administered 2018-04-02: 10:00:00 via TOPICAL

## 2018-04-02 MED ORDER — THROMBIN 5000 UNITS EX SOLR
OROMUCOSAL | Status: DC | PRN
  Administered 2018-04-02 (×2): via TOPICAL

## 2018-04-02 MED ORDER — ONDANSETRON HCL 4 MG PO TABS: 4.0000 mg | ORAL_TABLET | Freq: Four times a day (QID) | ORAL | Status: DC | PRN

## 2018-04-02 MED ORDER — DEXMEDETOMIDINE HCL 200 MCG/2ML IV SOLN
INTRAVENOUS | Status: DC | PRN
  Administered 2018-04-02: 16 ug via INTRAVENOUS

## 2018-04-02 MED ORDER — DOCUSATE SODIUM 100 MG PO CAPS
100.0000 mg | ORAL_CAPSULE | Freq: Two times a day (BID) | ORAL | Status: DC
  Administered 2018-04-02 – 2018-04-03 (×3): 100 mg via ORAL
  Filled 2018-04-02 (×3): qty 1

## 2018-04-02 MED ORDER — ALBUTEROL SULFATE (2.5 MG/3ML) 0.083% IN NEBU: 3.0000 mL | INHALATION_SOLUTION | RESPIRATORY_TRACT | Status: DC | PRN

## 2018-04-02 MED ORDER — SODIUM CHLORIDE 0.9 % IV SOLN: 250.0000 mL | INTRAVENOUS | Status: DC

## 2018-04-02 MED ORDER — SENNA 8.6 MG PO TABS
1.0000 | ORAL_TABLET | Freq: Two times a day (BID) | ORAL | Status: DC
  Administered 2018-04-02 – 2018-04-03 (×3): 8.6 mg via ORAL
  Filled 2018-04-02 (×3): qty 1

## 2018-04-02 MED ORDER — METHOCARBAMOL 500 MG PO TABS
500.0000 mg | ORAL_TABLET | Freq: Three times a day (TID) | ORAL | Status: DC
  Administered 2018-04-02 – 2018-04-03 (×3): 500 mg via ORAL
  Filled 2018-04-02 (×3): qty 1

## 2018-04-02 MED ORDER — ROCURONIUM BROMIDE 50 MG/5ML IV SOSY
PREFILLED_SYRINGE | INTRAVENOUS | Status: AC
  Filled 2018-04-02: qty 10

## 2018-04-02 MED ORDER — SODIUM CHLORIDE 0.9 % IV SOLN: INTRAVENOUS | Status: DC

## 2018-04-02 MED ORDER — LIDOCAINE-EPINEPHRINE 1 %-1:100000 IJ SOLN
INTRAMUSCULAR | Status: AC
  Filled 2018-04-02: qty 1

## 2018-04-02 MED ORDER — METHOCARBAMOL 500 MG PO TABS
500.0000 mg | ORAL_TABLET | Freq: Four times a day (QID) | ORAL | Status: DC | PRN
  Administered 2018-04-02 – 2018-04-03 (×2): 500 mg via ORAL
  Filled 2018-04-02: qty 1

## 2018-04-02 MED ORDER — SODIUM CHLORIDE 0.9% FLUSH: 3.0000 mL | INTRAVENOUS | Status: DC | PRN

## 2018-04-02 MED ORDER — MIDAZOLAM HCL 5 MG/5ML IJ SOLN
INTRAMUSCULAR | Status: DC | PRN
  Administered 2018-04-02: 2 mg via INTRAVENOUS

## 2018-04-02 MED ORDER — OXYCODONE HCL 5 MG PO TABS
ORAL_TABLET | ORAL | Status: AC
  Filled 2018-04-02: qty 2

## 2018-04-02 MED ORDER — SUCCINYLCHOLINE CHLORIDE 200 MG/10ML IV SOSY
PREFILLED_SYRINGE | INTRAVENOUS | Status: AC
  Filled 2018-04-02: qty 10

## 2018-04-02 MED ORDER — SUGAMMADEX SODIUM 200 MG/2ML IV SOLN
INTRAVENOUS | Status: DC | PRN
  Administered 2018-04-02: 300 mg via INTRAVENOUS

## 2018-04-02 MED ORDER — LACTATED RINGERS IV SOLN
INTRAVENOUS | Status: DC | PRN
  Administered 2018-04-02: 08:00:00 via INTRAVENOUS

## 2018-04-02 MED ORDER — CEFAZOLIN SODIUM-DEXTROSE 2-4 GM/100ML-% IV SOLN
INTRAVENOUS | Status: AC
  Filled 2018-04-02: qty 100

## 2018-04-02 MED ORDER — PHENOL 1.4 % MT LIQD: 1.0000 | OROMUCOSAL | Status: DC | PRN

## 2018-04-02 MED ORDER — ONDANSETRON HCL 4 MG/2ML IJ SOLN: 4.0000 mg | Freq: Four times a day (QID) | INTRAMUSCULAR | Status: DC | PRN

## 2018-04-02 MED ORDER — HYDROMORPHONE HCL 1 MG/ML IJ SOLN
0.2500 mg | INTRAMUSCULAR | Status: DC | PRN
  Administered 2018-04-02 (×4): 0.5 mg via INTRAVENOUS

## 2018-04-02 MED ORDER — THROMBIN 20000 UNITS EX SOLR
CUTANEOUS | Status: AC
  Filled 2018-04-02: qty 20000

## 2018-04-02 MED ORDER — MOMETASONE FURO-FORMOTEROL FUM 200-5 MCG/ACT IN AERO
2.0000 | INHALATION_SPRAY | Freq: Two times a day (BID) | RESPIRATORY_TRACT | Status: DC
  Administered 2018-04-02 – 2018-04-03 (×2): 2 via RESPIRATORY_TRACT
  Filled 2018-04-02 (×2): qty 8.8

## 2018-04-02 SURGICAL SUPPLY — 90 items
ADH SKN CLS APL DERMABOND .7 (GAUZE/BANDAGES/DRESSINGS) ×1
APL SKNCLS STERI-STRIP NONHPOA (GAUZE/BANDAGES/DRESSINGS) ×1
BAG DECANTER FOR FLEXI CONT (MISCELLANEOUS) ×4 IMPLANT
BENZOIN TINCTURE PRP APPL 2/3 (GAUZE/BANDAGES/DRESSINGS) ×2 IMPLANT
BIT DRILL 2.4 (BIT) ×1 IMPLANT
BIT DRILL 2.4MM (BIT) ×1
BLADE CLIPPER SURG (BLADE) ×1 IMPLANT
BLADE SURG 11 STRL SS (BLADE) ×5 IMPLANT
BLADE ULTRA TIP 2M (BLADE) IMPLANT
BONE XPANSE 9.5 X 8.5 X 6MM (Bone Implant) ×1 IMPLANT
BUR MATCHSTICK NEURO 3.0 LAGG (BURR) ×5 IMPLANT
BUR PRECISION FLUTE 5.0 (BURR) ×3 IMPLANT
CAGE PEEK TI 6X16X14 12D (Cage) ×2 IMPLANT
CANISTER SUCT 3000ML PPV (MISCELLANEOUS) ×4 IMPLANT
CARTRIDGE OIL MAESTRO DRILL (MISCELLANEOUS) ×2 IMPLANT
CLOSURE WOUND 1/2 X4 (GAUZE/BANDAGES/DRESSINGS) ×1
COVER WAND RF STERILE (DRAPES) ×2 IMPLANT
DECANTER SPIKE VIAL GLASS SM (MISCELLANEOUS) ×6 IMPLANT
DERMABOND ADVANCED (GAUZE/BANDAGES/DRESSINGS) ×2
DERMABOND ADVANCED .7 DNX12 (GAUZE/BANDAGES/DRESSINGS) ×1 IMPLANT
DIFFUSER DRILL AIR PNEUMATIC (MISCELLANEOUS) ×4 IMPLANT
DRAPE C-ARM 42X72 X-RAY (DRAPES) ×16 IMPLANT
DRAPE HALF SHEET 40X57 (DRAPES) ×2 IMPLANT
DRAPE LAPAROTOMY 100X72 PEDS (DRAPES) ×6 IMPLANT
DRAPE MICROSCOPE LEICA (MISCELLANEOUS) ×3 IMPLANT
DRSG OPSITE 4X5.5 SM (GAUZE/BANDAGES/DRESSINGS) ×6 IMPLANT
DRSG OPSITE POSTOP 3X4 (GAUZE/BANDAGES/DRESSINGS) ×2 IMPLANT
DRSG OPSITE POSTOP 4X8 (GAUZE/BANDAGES/DRESSINGS) ×2 IMPLANT
DURAPREP 6ML APPLICATOR 50/CS (WOUND CARE) ×2 IMPLANT
ELECT CAUTERY BLADE 6.4 (BLADE) ×2 IMPLANT
ELECT COATED BLADE 2.86 ST (ELECTRODE) ×3 IMPLANT
ELECT REM PT RETURN 9FT ADLT (ELECTROSURGICAL) ×6
ELECTRODE REM PT RTRN 9FT ADLT (ELECTROSURGICAL) ×2 IMPLANT
GAUZE 4X4 16PLY RFD (DISPOSABLE) IMPLANT
GAUZE SPONGE 4X4 12PLY STRL (GAUZE/BANDAGES/DRESSINGS) IMPLANT
GLOVE BIO SURGEON STRL SZ 6.5 (GLOVE) ×6 IMPLANT
GLOVE BIO SURGEON STRL SZ7 (GLOVE) IMPLANT
GLOVE BIO SURGEON STRL SZ7.5 (GLOVE) ×4 IMPLANT
GLOVE BIO SURGEONS STRL SZ 6.5 (GLOVE) ×6
GLOVE BIOGEL PI IND STRL 6.5 (GLOVE) IMPLANT
GLOVE BIOGEL PI IND STRL 7.0 (GLOVE) IMPLANT
GLOVE BIOGEL PI IND STRL 7.5 (GLOVE) ×3 IMPLANT
GLOVE BIOGEL PI INDICATOR 6.5 (GLOVE) ×6
GLOVE BIOGEL PI INDICATOR 7.0 (GLOVE) ×2
GLOVE BIOGEL PI INDICATOR 7.5 (GLOVE) ×6
GLOVE ECLIPSE 7.0 STRL STRAW (GLOVE) ×6 IMPLANT
GLOVE EXAM NITRILE XL STR (GLOVE) IMPLANT
GOWN STRL REUS W/ TWL LRG LVL3 (GOWN DISPOSABLE) ×2 IMPLANT
GOWN STRL REUS W/ TWL XL LVL3 (GOWN DISPOSABLE) ×1 IMPLANT
GOWN STRL REUS W/TWL 2XL LVL3 (GOWN DISPOSABLE) IMPLANT
GOWN STRL REUS W/TWL LRG LVL3 (GOWN DISPOSABLE) ×24
GOWN STRL REUS W/TWL XL LVL3 (GOWN DISPOSABLE)
HEMOSTAT POWDER KIT SURGIFOAM (HEMOSTASIS) ×5 IMPLANT
INSERT GRAFT XPANSE 9.5X8.5X6 (Bone Implant) ×1 IMPLANT
KIT BASIN OR (CUSTOM PROCEDURE TRAY) ×4 IMPLANT
KIT TURNOVER KIT B (KITS) ×4 IMPLANT
NDL SPNL 22GX3.5 QUINCKE BK (NEEDLE) ×1 IMPLANT
NEEDLE HYPO 22GX1.5 SAFETY (NEEDLE) ×4 IMPLANT
NEEDLE SPNL 22GX3.5 QUINCKE BK (NEEDLE) ×3 IMPLANT
NS IRRIG 1000ML POUR BTL (IV SOLUTION) ×4 IMPLANT
OIL CARTRIDGE MAESTRO DRILL (MISCELLANEOUS) ×3
PACK LAMINECTOMY NEURO (CUSTOM PROCEDURE TRAY) ×6 IMPLANT
PAD ARMBOARD 7.5X6 YLW CONV (MISCELLANEOUS) ×12 IMPLANT
PATTIES SURGICAL .5 X3 (DISPOSABLE) ×2 IMPLANT
PIN MAYFIELD SKULL DISP (PIN) ×3 IMPLANT
PLATE ZEVO 1LVL 19MM (Plate) ×2 IMPLANT
ROD PRECUT 3.5X80 (Rod) ×4 IMPLANT
RUBBERBAND STERILE (MISCELLANEOUS) ×6 IMPLANT
SCREW 3.5 SELFDRILL 15MM VARI (Screw) ×8 IMPLANT
SCREW MULTI AXIAL 3.5X14MM (Screw) ×20 IMPLANT
SCREW SET 3600315 STANDARD (Screw) ×30 IMPLANT
SCREW SET 3600315 STD (Screw) IMPLANT
SPONGE INTESTINAL PEANUT (DISPOSABLE) ×3 IMPLANT
SPONGE LAP 4X18 RFD (DISPOSABLE) IMPLANT
SPONGE SURGIFOAM ABS GEL 100 (HEMOSTASIS) ×3 IMPLANT
SPONGE SURGIFOAM ABS GEL SZ50 (HEMOSTASIS) ×1 IMPLANT
STAPLER VISISTAT 35W (STAPLE) ×4 IMPLANT
STRIP CLOSURE SKIN 1/2X4 (GAUZE/BANDAGES/DRESSINGS) ×1 IMPLANT
SUT ETHILON 3 0 FSL (SUTURE) IMPLANT
SUT VIC AB 0 CT1 18XCR BRD8 (SUTURE) ×1 IMPLANT
SUT VIC AB 0 CT1 8-18 (SUTURE) ×12
SUT VIC AB 2-0 CT1 18 (SUTURE) ×2 IMPLANT
SUT VIC AB 3-0 SH 8-18 (SUTURE) ×3 IMPLANT
SUT VICRYL 3-0 RB1 18 ABS (SUTURE) ×4 IMPLANT
TAPE CLOTH 3X10 TAN LF (GAUZE/BANDAGES/DRESSINGS) ×3 IMPLANT
TOWEL GREEN STERILE (TOWEL DISPOSABLE) ×4 IMPLANT
TOWEL GREEN STERILE FF (TOWEL DISPOSABLE) ×6 IMPLANT
TRAY FOLEY MTR SLVR 16FR STAT (SET/KITS/TRAYS/PACK) ×2 IMPLANT
UNDERPAD 30X30 (UNDERPADS AND DIAPERS) ×1 IMPLANT
WATER STERILE IRR 1000ML POUR (IV SOLUTION) ×4 IMPLANT
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
## 2018-04-02 NOTE — Anesthesia Procedure Notes (Signed)
Arterial Line Insertion Start/End12/17/2019 8:07 AM, 04/02/2018 8:10 AM Performed by: Rosalio MacadamiaHayes, Christine T, CRNA, CRNA  Preanesthetic checklist: patient identified, IV checked, site marked, risks and benefits discussed, surgical consent, monitors and equipment checked, pre-op evaluation, timeout performed and anesthesia consent Left, radial was placed Catheter size: 20 G Hand hygiene performed  and maximum sterile barriers used   Attempts: 1 Procedure performed without using ultrasound guided technique. Following insertion, Biopatch and dressing applied. Post procedure assessment: normal  Patient tolerated the procedure well with no immediate complications.

## 2018-04-02 NOTE — Anesthesia Procedure Notes (Signed)
Procedure Name: Intubation Date/Time: 04/02/2018 8:04 AM Performed by: Marena ChancyBeckner, Darcee Dekker S, CRNA Pre-anesthesia Checklist: Patient identified, Emergency Drugs available, Suction available and Patient being monitored Patient Re-evaluated:Patient Re-evaluated prior to induction Oxygen Delivery Method: Circle System Utilized Preoxygenation: Pre-oxygenation with 100% oxygen Induction Type: IV induction Ventilation: Mask ventilation without difficulty Laryngoscope Size: Glidescope and 4 Grade View: Grade I Tube type: Oral Tube size: 7.5 mm Number of attempts: 1 Airway Equipment and Method: Stylet and Oral airway Placement Confirmation: ETT inserted through vocal cords under direct vision,  positive ETCO2 and breath sounds checked- equal and bilateral Tube secured with: Tape Dental Injury: Teeth and Oropharynx as per pre-operative assessment

## 2018-04-02 NOTE — Plan of Care (Signed)
  Problem: Education: Goal: Ability to verbalize activity precautions or restrictions will improve Outcome: Progressing Goal: Knowledge of the prescribed therapeutic regimen will improve Outcome: Progressing Goal: Understanding of discharge needs will improve Outcome: Progressing   Problem: Activity: Goal: Ability to avoid complications of mobility impairment will improve Outcome: Progressing Goal: Ability to tolerate increased activity will improve Outcome: Progressing Goal: Will remain free from falls Outcome: Progressing   Problem: Pain Management: Goal: Pain level will decrease Outcome: Progressing   Problem: Bladder/Genitourinary: Goal: Urinary functional status for postoperative course will improve Outcome: Progressing   

## 2018-04-02 NOTE — Anesthesia Postprocedure Evaluation (Signed)
Anesthesia Post Note  Patient: Edwin Dadalarence R Wolford  Procedure(s) Performed: CERVICAL THREE- CERVICAL FOUR, CERVICAL FOUR- CERVICAL FIVE, CERVICAL FIVE- CERVICAL SIX, CERVICAL SIX- CERVICAL SEVEN LAMINECTOMY WITH LATERAL MASS POSTERIOR SEGMENTAL INSTRUEMNTATION, POSTERIOR LATERAL ARTHRODESIS (N/A Neck) ANTERIOR CERVICAL DECOMPRESSION/DISCECTOMY FUSION CERVICAL SIX- CERVICAL SEVEN  (N/A Neck)     Patient location during evaluation: PACU Anesthesia Type: General Level of consciousness: awake Pain management: pain level controlled Respiratory status: spontaneous breathing Cardiovascular status: stable Postop Assessment: no apparent nausea or vomiting Anesthetic complications: no    Last Vitals:  Vitals:   04/02/18 1502 04/02/18 1535  BP: 137/88 (!) 144/98  Pulse:  79  Resp:  16  Temp: (!) 36.1 C (!) 36.3 C  SpO2:  100%    Last Pain:  Vitals:   04/02/18 1715  TempSrc:   PainSc: 5                  Zaina Jenkin

## 2018-04-02 NOTE — Anesthesia Preprocedure Evaluation (Addendum)
Anesthesia Evaluation  Patient identified by MRN, date of birth, ID band Patient awake    Reviewed: Allergy & Precautions, NPO status , Patient's Chart, lab work & pertinent test results  Airway Mallampati: II  TM Distance: >3 FB     Dental   Pulmonary asthma , former smoker,    breath sounds clear to auscultation       Cardiovascular negative cardio ROS   Rhythm:Regular Rate:Normal     Neuro/Psych    GI/Hepatic negative GI ROS, Neg liver ROS,   Endo/Other  negative endocrine ROS  Renal/GU negative Renal ROS     Musculoskeletal   Abdominal   Peds  Hematology   Anesthesia Other Findings   Reproductive/Obstetrics                             Anesthesia Physical Anesthesia Plan  ASA: III  Anesthesia Plan: General   Post-op Pain Management:    Induction: Intravenous  PONV Risk Score and Plan: Ondansetron, Dexamethasone and Midazolam  Airway Management Planned: Oral ETT  Additional Equipment:   Intra-op Plan:   Post-operative Plan:   Informed Consent: I have reviewed the patients History and Physical, chart, labs and discussed the procedure including the risks, benefits and alternatives for the proposed anesthesia with the patient or authorized representative who has indicated his/her understanding and acceptance.   Dental advisory given  Plan Discussed with: CRNA and Anesthesiologist  Anesthesia Plan Comments:        Anesthesia Quick Evaluation

## 2018-04-02 NOTE — Transfer of Care (Signed)
Immediate Anesthesia Transfer of Care Note  Patient: Shawn Raymond  Procedure(s) Performed: CERVICAL THREE- CERVICAL FOUR, CERVICAL FOUR- CERVICAL FIVE, CERVICAL FIVE- CERVICAL SIX, CERVICAL SIX- CERVICAL SEVEN LAMINECTOMY WITH LATERAL MASS POSTERIOR SEGMENTAL INSTRUEMNTATION, POSTERIOR LATERAL ARTHRODESIS (N/A Neck) ANTERIOR CERVICAL DECOMPRESSION/DISCECTOMY FUSION CERVICAL SIX- CERVICAL SEVEN  (N/A Neck)  Patient Location: PACU  Anesthesia Type:General  Level of Consciousness: awake, alert  and oriented  Airway & Oxygen Therapy: Patient Spontanous Breathing and Patient connected to nasal cannula oxygen  Post-op Assessment: Report given to RN, Post -op Vital signs reviewed and stable and Patient moving all extremities X 4  Post vital signs: Reviewed and stable  Last Vitals:  Vitals Value Taken Time  BP 160/94 04/02/2018  1:31 PM  Temp 36.5 C 04/02/2018  1:30 PM  Pulse 74 04/02/2018  1:35 PM  Resp 13 04/02/2018  1:35 PM  SpO2 100 % 04/02/2018  1:35 PM  Vitals shown include unvalidated device data.  Last Pain:  Vitals:   04/02/18 0654  PainSc: 8       Patients Stated Pain Goal: 2 (04/02/18 0654)  Complications: No apparent anesthesia complications

## 2018-04-02 NOTE — Op Note (Signed)
NEUROSURGERY OPERATIVE NOTE   PREOP DIAGNOSIS:  1. 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 Laminectomy C3-C7 for decompression of thecal sac and exiting nerve roots 2. Posterior segmental instrumentation using Medtronic vertex lateral mass screws C3-C7 3. Posterolateral arthrodesis C3-C7 4.   Use of morcellized bone autograft  PROCEDURE STAGE 2: 1.   Anterior cervical discectomy, C6-7 for decompression of thecal sac and exiting nerve roots 2.   Placement of intervertebral biomechanical device -Medtronic PTC PEEK 6 mm lordotic cage 3.   Placement of anterior instrumentation - Medtronic Zevo plate 3.   Interbody arthrodesis, C6-7 4.   Use of nonstructural bone allograft 5.   Use of intraoperative microscope for microdissection  SURGEON: Dr. Consuella Lose, MD  ASSISTANT: Ferne Reus, PA-C  ANESTHESIA: General Endotracheal  EBL: 400cc  SPECIMENS: None  DRAINS: None  COMPLICATIONS: None immediate  CONDITION: Hemodynamically stable to PACU  HISTORY: Shawn Raymond is a 37 y.o. male initially presented to the outpatient neurosurgery clinic with neck pain, and both arm and leg weakness.  He also complained of gait instability.  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  There was radiographic spinal cord compression with intrinsic T2 signal change.  With these clinical and radiographic findings, surgical decompression was indicated.  The risks and benefits of the surgery were explained in detail with the patient and his family.  After all questions were answered informed consent was obtained and witnessed.  PROCEDURE IN DETAIL: The patient was brought to the operating room. After induction of general anesthesia, the patient was positioned on the operative table in the [place] head holder in the  prone position. All pressure points were meticulously padded.  Alignment of the cervical spine was visually inspected and found to be neutral.  Skin incision was then marked out and prepped and draped in the usual sterile fashion.  After timeout was conducted, midline skin incision was identified and infiltrated with local anesthetic with epinephrine.  Incision was then made sharply and Bovie electrocautery was used to dissect through subcutaneous tissue.  The nuchal fascia was identified, and the midline avascular plane was identified.  This was then used to identify the spinous processes from C2-C7.  Subperiosteal dissection was then carried out along the lamina to identify the lamina and lateral masses at C3-C7.  Intraoperative fluoroscopy with a towel clamp placed on the C to spinous process at the superior end of the dissection was taken to confirm our location at the correct levels.  At this point using standard anatomic landmarks, entry points for lateral mass screws from C3-C7 were identified.  Drill was then used to create pilot holes for lateral mass screws bilaterally from C3-C7.  These were then checked with a sound, and tapped to approximately 14 mm.  Hemostasis was then secured with morselized foam with thrombin.  At this point attention was turned to decompression.  Using the high-speed drill, troughs were drilled from the inferior aspect of the C7 lamina through the superior aspect of the C3 lamina bilaterally.  Thin plate Kerrison rongeurs were used to complete these troughs.  The ligamentum flavum was then cut across the top of the T1 lamina as well as the bottom of the C2 lamina.  The entire C3-C7 lamina were then elevated and removed en bloc taking care to prevent any compression of the thecal sac during  removal.   Kerrison rongeurs were then used to remove any remaining laminar bone at the lateral edges of the decompression.  Hemostasis was secured with morselized Gelfoam and  thrombin.  Having completed decompression, high-speed drill was used to decorticate the exposed lateral masses and joints from C3-C7.  14 mm vertex lateral mass screws were then placed bilaterally from C3-C7.  80 mm pre-bent lordotic rod was then placed in the lateral mass screws.  Set screws were placed and final tightened.  The bone harvested during laminectomy was then removed of any overlying soft tissue, morselized, and placed over the decorticated lateral mass surfaces in order to facilitate arthrodesis.  Final AP and lateral fluoroscopic images were taken to confirm good position of the implanted hardware.  The wound was then irrigated with copious amounts of normal saline irrigation.  Wound was then closed in standard fashion in multiple layers using interrupted 0 and 2-0 Vicryl stitches.  Skin was closed with staples.  Bacitracin ointment and sterile dressing was applied.  The patient was then transferred to the stretcher and the [place] head holder was removed.  He was then transferred back to the operative table in preparation for the anterior portion of the procedure.  At the end of the first portion of the procedure all sponge, needle, instrument, and cottonoid counts were correct.  Patient was then repositioned supine with all pressure points again meticulously padded.  Shoulders were taped down in order to obtain intraoperative fluoroscopic images.  After second timeout was conducted, transverse skin incision was infiltrated with local anesthetic with epinephrine on the right side.  Incision was then made sharply.  Bovie electrocautery was used to dissect through subcutaneous tissue.  The platysma was identified.  The platysma was then incised vertically.  Using natural fascial planes in the neck, the medial border of the sternocleidomastoid muscle was identified, dissection was then carried deeper to identify the carotid sheath.  The medial border of the carotid was then identified immediately  adjacent to the aerodigestive tract.  Dissection was carried deeper to identify the prevertebral fascia which was incised.  Longus coli muscles bilaterally were identified.  Fine needle was introduced into the C6-7 disc space and lateral fluoroscopic image was taken to confirm location at the correct level.  Bovie electrocautery was then used to dissected in the subperiosteal plane to elevate the longus coli muscles around the C6 and C7 vertebral bodies and the disc space.  Table mounted retractor was then placed.  Superficial discectomy was then initiated using combination of curettes and rongeurs.  Anterior osteophytes were taken down with Kerrison punches.  Microscope was then draped sterilely and brought in the field and the remainder of the case was done under the microscope using microdissection technique.  The remainder of the discectomy was completed with a high-speed drill.  The posterior longitudinal ligament was identified and punctured with the micro-nerve hook.  #1 Kerrison punch was then used to incise the posterior longitudinal ligament.  Immediately subjacent to the ligament I did note multiple free disc fragments compressing the thecal sac on the left side.  Using Kerrison Stann [place], the posterior longitudinal ligament and disc fragments were removed.  Once the PLL and disc fragments were removed, I did note the thecal sac did not appear to be compressed any longer.  I was able to freely pass a small dissector within the ventral epidural space indicating good decompression.  At this point hemostasis was secured with morselized Gelfoam and thrombin.  The endplates were  prepared with curettes.  A 6 mm lordotic trial was placed and found to fit correctly.  The cage was then packed with nonstructural bone allograft.  This was tapped into place.  The above anterior plate was then placed over the C6 and C7 vertebral bodies and secured with 15 mm screws in C6 and C7.  At this point hemostasis was  secured with morselized Gelfoam and thrombin, and the wound was packed with a sterile 4 x 4.  Intraoperative AP and lateral fluoroscopy confirmed good position of the implanted hardware.  The wound was then irrigated with copious normal saline irrigation.  The platysma was then closed with interrupted 3-0 Vicryl stitches, and the skin was closed with interrupted subcuticular 3-0 Vicryl.  A layer of Dermabond was applied.  At the end of the case all sponge, needle, instrument, and cottonoid counts were correct.  The patient was then extubated, transferred to the stretcher, and taken to the postanesthesia care unit in stable hemodynamic condition.

## 2018-04-03 ENCOUNTER — Encounter (HOSPITAL_COMMUNITY): Payer: Self-pay | Admitting: Neurosurgery

## 2018-04-03 LAB — CBC
HCT: 39.8 % (ref 39.0–52.0)
Hemoglobin: 13.1 g/dL (ref 13.0–17.0)
MCH: 29.2 pg (ref 26.0–34.0)
MCHC: 32.9 g/dL (ref 30.0–36.0)
MCV: 88.8 fL (ref 80.0–100.0)
Platelets: 239 10*3/uL (ref 150–400)
RBC: 4.48 MIL/uL (ref 4.22–5.81)
RDW: 13 % (ref 11.5–15.5)
WBC: 19.8 10*3/uL — ABNORMAL HIGH (ref 4.0–10.5)
nRBC: 0 % (ref 0.0–0.2)

## 2018-04-03 LAB — BASIC METABOLIC PANEL
Anion gap: 13 (ref 5–15)
BUN: 8 mg/dL (ref 6–20)
CO2: 26 mmol/L (ref 22–32)
Calcium: 9.1 mg/dL (ref 8.9–10.3)
Chloride: 97 mmol/L — ABNORMAL LOW (ref 98–111)
Creatinine, Ser: 1.2 mg/dL (ref 0.61–1.24)
GFR calc Af Amer: >60 mL/min (ref >60)
GFR calc non Af Amer: >60 mL/min (ref >60)
Glucose, Bld: 130 mg/dL — ABNORMAL HIGH (ref 70–99)
Potassium: 3.8 mmol/L (ref 3.5–5.1)
Sodium: 136 mmol/L (ref 135–145)

## 2018-04-03 LAB — APTT: aPTT: 29 s (ref 24–36)

## 2018-04-03 LAB — PROTIME-INR
INR: 1.04
Prothrombin Time: 13.5 seconds (ref 11.4–15.2)

## 2018-04-03 MED ORDER — OXYCODONE-ACETAMINOPHEN 7.5-325 MG PO TABS: 1.0000 | ORAL_TABLET | ORAL | 0 refills | Status: DC | PRN
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
## 2018-04-03 NOTE — Progress Notes (Signed)
  NEUROSURGERY PROGRESS NOTE   No issues overnight.  Reports significant improvement in BUE/BLE strength and gait Mild neck soreness and dysphagia although tolerating diet Pain manageable Voiding normal Eager for d/c  EXAM:  BP 114/73 (BP Location: Left Arm)   Pulse 65   Temp 98.4 F (36.9 C) (Oral)   Resp 18   Ht 5\' 6"  (1.676 m)   Wt 90.5 kg   SpO2 100%   BMI 32.22 kg/m   Awake, alert, oriented  Speech fluent, appropriate  CN grossly intact  MAEW with good strength Incision c/d/i, no palpable hematoma  PLAN Doing well this am Cleared for d/c

## 2018-04-03 NOTE — Discharge Summary (Signed)
Physician Discharge Summary  Patient ID: XZAYVION VAETH MRN: [id number] DOB/AGE: 1980/06/09 37 y.o.  Admit date: 04/02/2018 Discharge date: 04/03/2018  Admission Diagnoses:  Cervical stenosis with myelopathy  Discharge Diagnoses:  Same Active Problems:   Stenosis of cervical spine with myelopathy [facility])   Discharged Condition: Stable  Hospital Course:  Shawn Raymond is a 37 y.o. male who was admitted for the below procedure. There were no post operative complications. At time of discharge, pain was well controlled, ambulating with Pt/OT, tolerating po, voiding normal. Ready for discharge.  Treatments: Surgery PROCEDURE STAGE 1: 1. Laminectomy C3-C7 for decompression of thecal sac and exiting nerve roots 2. Posterior segmental instrumentation using Medtronic vertex lateral mass screws C3-C7 3. Posterolateral arthrodesis C3-C7 4.   Use of morcellized bone autograft  PROCEDURE STAGE 2: 1.   Anterior cervical discectomy, C6-7 for decompression of thecal sac and exiting nerve roots 2.   Placement of intervertebral biomechanical device -Medtronic PTC PEEK 6 mm lordotic cage 3.   Placement of anterior instrumentation - Medtronic Zevo plate 3.   Interbody arthrodesis, C6-7 4.   Use of nonstructural bone allograft 5.   Use of intraoperative microscope for microdissection  Discharge Exam: Blood pressure 114/73, pulse 65, temperature 98.4 F (36.9 C), temperature source Oral, resp. rate 18, height _0  (1.676 m), weight 90.5 kg, SpO2 100 %. Awake, alert, oriented Speech fluent, appropriate CN grossly intact MAEW with good strength Wound c/d/i  Disposition: Home with [facility] PT/OT  Discharge Instructions    Call MD for:  difficulty breathing, headache or visual disturbances   Complete by:  As directed    Call MD for:  persistant dizziness or light-headedness   Complete by:  As directed    Call MD for:  redness, tenderness, or signs of infection (pain, swelling,  redness, odor or green/yellow discharge around incision site)   Complete by:  As directed    Call MD for:  severe uncontrolled pain   Complete by:  As directed    Call MD for:  temperature >100.4   Complete by:  As directed    Diet general   Complete by:  As directed    Driving Restrictions   Complete by:  As directed    Do not drive until given clearance.   Increase activity slowly   Complete by:  As directed    Lifting restrictions   Complete by:  As directed    Do not lift anything >10lbs. Avoid bending and twisting in awkward positions. Avoid bending at the back.   May shower / Bathe   Complete by:  As directed    In 24 hours. Okay to wash wound with warm soapy water. Avoid scrubbing the wound. Pat dry.   Remove dressing in 24 hours   Complete by:  As directed      Allergies as of 04/03/2018      Reactions   Shellfish Allergy Anaphylaxis      Medication List    STOP taking these medications   HYDROcodone-acetaminophen 5-325 MG tablet Commonly known as:  NORCO     TAKE these medications   albuterol 108 (90 Base) MCG/ACT inhaler Commonly known as:  VENTOLIN HFA INHALE 2 PUFFS INTO THE LUNGS EVERY 4 (FOUR) HOURS AS NEEDED FOR WHEEZING OR SHORTNESS OF BREATH. What changed:    how much to take  how to take this  when to take this  reasons to take this  additional instructions   budesonide-formoterol 160-4.5 MCG/ACT  inhaler Commonly known as:  SYMBICORT Inhale 2 puffs into the lungs 2 (two) times daily.   chlorhexidine 0.12 % solution Commonly known as:  PERIDEX Use as directed 15 mLs in the mouth or throat 2 (two) times daily.   meloxicam 15 MG tablet Commonly known as:  MOBIC TAKE 1 TABLET (15 MG TOTAL) BY MOUTH DAILY.   methocarbamol 500 MG tablet Commonly known as:  ROBAXIN Take 1 tablet (500 mg total) by mouth 3 (three) times daily.   omeprazole 40 MG capsule Commonly known as:  PRILOSEC Take 1 capsule (40 mg total) by mouth daily.    oxyCODONE-acetaminophen 7.5-325 MG tablet Commonly known as:  PERCOCET Take 1 tablet by mouth every 4 (four) hours as needed.            Durable Medical Equipment  (From admission, onward)         Start     Ordered   04/03/18 0806  DME 3-in-1  Once     04/03/18 0806   04/03/18 0806  For home use only DME Walker rolling  [facility])  Once    Question:  Patient needs a walker to treat with the following condition  Answer:  Gait instability   04/03/18 0806         Follow-up Information    Consuella Lose, MD. Schedule an appointment as soon as possible for a visit in 50 day(s).   Specialty:  Neurosurgery Contact information: [address] [place] [address] [phone]           Signed: Traci Sermon 04/03/2018, 8:06 AM
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
## 2018-04-03 NOTE — Progress Notes (Signed)
Patient is discharged from room 3C04. Alert and in stable condition. IV site d/c'd and instructions read to patient and spouse with understanding verbalized. Left unit via wheelchair with all belongings at side. 

## 2018-04-03 NOTE — Discharge Instructions (Signed)
Wound Care  °You may remove outer bandage after 2 days and shower.  °Keep incision open to air. °Do not put any creams, lotions, or ointments on incision. ° °Activity °Walk each and every day, increasing distance each day. °No lifting greater than 5 lbs.  Avoid excessive neck motion. °No driving for 2 weeks; may ride as a passenger locally. °Wear neck brace at all times except when showering. °Diet °Resume your normal diet.  °Return to Work °Will be discussed at you follow up appointment. °Call Your Doctor If Any of These Occur °Redness, drainage, or swelling at the wound.  °Temperature greater than 101 degrees. °Severe pain not relieved by pain medication. °Increased difficulty swallowing.  °Incision starts to come apart. °Follow Up Appt °Call today and ask for appointment in 1-2 weeks ([phone]) or for problems.  If you have any hardware placed in your spine, you will need an x-ray before your appointment. ° °

## 2018-04-12 MED FILL — METHOCARBAMOL 500 MG TABS: 500 | 20 days supply | Qty: 60 | Fill #5

## 2018-04-12 MED FILL — SYMBICORT 160-4.5 MCG INH: 160-4.5 | 30 days supply | Qty: 10 | Fill #5

## 2018-04-29 MED FILL — OMEPRAZOLE DR 40 MG CAPSULE: 40 | 30 days supply | Qty: 30 | Fill #3

## 2018-05-13 ENCOUNTER — Encounter (INDEPENDENT_AMBULATORY_CARE_PROVIDER_SITE_OTHER): Payer: Self-pay | Admitting: Internal Medicine

## 2018-05-13 ENCOUNTER — Ambulatory Visit (INDEPENDENT_AMBULATORY_CARE_PROVIDER_SITE_OTHER): Payer: No Typology Code available for payment source | Admitting: Internal Medicine

## 2018-05-13 ENCOUNTER — Other Ambulatory Visit: Payer: Self-pay

## 2018-05-13 VITALS — BP 129/67 | HR 57 | Temp 97.8°F | Ht 66.0 in | Wt 191.8 lb

## 2018-05-13 DIAGNOSIS — G44019 Episodic cluster headache, not intractable: Secondary | ICD-10-CM | POA: Diagnosis not present

## 2018-05-13 DIAGNOSIS — M5010 Cervical disc disorder with radiculopathy, unspecified cervical region: Secondary | ICD-10-CM

## 2018-05-13 DIAGNOSIS — Z2821 Immunization not carried out because of patient refusal: Secondary | ICD-10-CM | POA: Diagnosis not present

## 2018-05-13 MED ORDER — SUMATRIPTAN SUCCINATE 50 MG PO TABS: ORAL_TABLET | ORAL | 1 refills | Status: DC

## 2018-05-13 NOTE — Patient Instructions (Signed)
Cluster Headache  A cluster headache is a type of headache that causes deep, intense head pain. Cluster headaches can last from 15 minutes to 3 hours. They usually occur:   On one side of the head. They may occur on the other side when a new cluster of headaches begins.   Repeatedly over weeks to months.   Several times a day.   At the same time of day, often at night.   More often in the fall and springtime.  What are the causes?  The cause of this condition is not known.  What increases the risk?  This condition is more likely to develop in:   Males.   People who drink alcohol.   People who smoke or use products that contain nicotine or tobacco.   People who take medicines that cause blood vessels to expand, such as nitroglycerin.   People who take antihistamines.  What are the signs or symptoms?  Symptoms of this condition include:   Severe pain on one side of the head that begins behind or around your eye or temple.   Pain on one side of the head.   Nausea.   Sensitivity to light.   Runny nose and nasal stuffiness.   Sweaty, pale skin on the face.   Droopy or swollen eyelid, eye redness, or tearing.   Restlessness and agitation.  How is this diagnosed?  This condition may be diagnosed based on:   Your symptoms.   A physical exam.  Your health care provider may order tests to see if your headaches are caused by another medical condition. These tests may show that you do not have cluster headaches. Tests may include:   A CT scan of your head.   An MRI of your head.   Lab tests.  How is this treated?  This condition may be treated with:   Medicines to relieve pain and to prevent repeated (recurrent) attacks. Some people may need a combination of medicines.   Oxygen. This helps to relieve pain.  Follow these instructions at home:  Headache diary  Keep a headache diary as told by your health care provider. Doing this can help you and your health care provider figure out what triggers your  headaches. In your headache diary, include information about:   The time of day that your headache started and what you were doing when it began.   How long your headache lasted.   Where your pain started and whether it moved to other areas.   The type of pain, such as burning, stabbing, throbbing, or cramping.   Your level of pain. Use a pain scale and rate the pain with a number from 1 (mild) up to 10 (severe).   The treatment that you used, and any change in symptoms after treatment.    Medicines   Take over-the-counter and prescription medicines only as told by your health care provider.   Do not drive or use heavy machinery while taking prescription pain medicine.   Use oxygen as told by your health care provider.  Lifestyle   Follow a regular sleep schedule. Do not vary the time that you go to bed or the amount that you sleep from day to day. It is important to stay on the same schedule during a cluster period to help prevent headaches.   Exercise regularly.   Eat a healthy diet and avoid foods that may trigger your headaches.   Avoid alcohol.   Do not use   any products that contain nicotine or tobacco, such as cigarettes and e-cigarettes. If you need help quitting, ask your health care provider.  Contact a health care provider if:   Your headaches change, become more severe, or occur more often.   The medicine or oxygen that your health care provider recommended does not help.  Get help right away if:   You faint.   You have weakness or numbness, especially on one side of your body or face.   You have double vision.   You have nausea or vomiting that does not go away within several hours.   You have trouble talking, walking, or keeping your balance.   You have pain or stiffness in your neck.   You have a fever.  Summary   A cluster headache is a type of headache that causes deep, intense head pain, usually on one side of the head.   Keep a headache diary to help discover what triggers  your headaches.   A regular sleep schedule can help prevent headaches.  This information is not intended to replace advice given to you by your health care provider. Make sure you discuss any questions you have with your health care provider.  Document Released: 04/03/2005 Document Revised: 12/14/2015 Document Reviewed: 12/14/2015  Elsevier Interactive Patient Education  2019 [employer] Inc[employer]

## 2018-05-13 NOTE — Progress Notes (Signed)
Patient ID: Shawn Raymond, male    DOB: Jul 07, 1980  MRN: [id number]  CC: Follow-up (CERVICAL RADICULOPATHY )   Subjective: Shawn Raymond is a 38 y.o. male who presents for hospital follow-up.  Patient seen at [facility] center.  Wife is with him His concerns today include:  Patient with history of cervical radiculopathy due to cervical stenosis, tobacco dependence, asthma  Since last visit, patient was hospitalized 12/17-18/2019 for laminectomy C3-C7 decompression Seen last wk Starts P.T tomorrow.  -started having HA about 1 wk after surgery.  Occurring daily HA unilateral and frontal behind the eye. Usually LT side.  Associated with tearing and photophobia.  No N/V/blurred vision.    Takes Tylenol 500 two tabs daily.  Helps sometimes. Taking Percocet one TID for neck pain from his surgery.  Reports being diagnosed with migraines as a teenager but has not been bothered with them since.  He gets occasional headaches but not like what he is having now..    Tob Dep:  Quit 6 mths ago.   Patient Active Problem List   Diagnosis Date Noted  . Stenosis of cervical spine with myelopathy (HCC) 04/02/2018  . Cervical disc disorder with radiculopathy of cervical region 01/25/2018  . Spinal stenosis in cervical region 01/25/2018  . Cervicalgia 01/25/2018  . Heartburn 01/25/2018  . FRACTURE, ANKLE, RIGHT 08/09/2009  . ANKLE SPRAIN, RIGHT 08/09/2009  . COCAINE ABUSE 08/03/2009  . TOBACCO ABUSE 08/02/2009  . 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 INTRINSIC ASTHMA, UNSPECIFIED 03/23/2009  . Nonspecific (abnormal) findings on radiological and other examination of body structure 01/07/2009  . CT, CHEST, ABNORMAL 01/07/2009     Current Outpatient Medications on File Prior to Visit  Medication Sig Dispense Refill  . albuterol (VENTOLIN HFA) 108 (90 Base) MCG/ACT inhaler INHALE 2 PUFFS INTO THE LUNGS EVERY 4 (FOUR) HOURS AS NEEDED FOR WHEEZING OR SHORTNESS OF BREATH. (Patient taking  differently: Inhale 2 puffs into the lungs every 4 (four) hours as needed for wheezing or shortness of breath. ) 18 g 11  . budesonide-formoterol (SYMBICORT) 160-4.5 MCG/ACT inhaler Inhale 2 puffs into the lungs 2 (two) times daily. 1 Inhaler 11  . methocarbamol (ROBAXIN) 500 MG tablet Take 1 tablet (500 mg total) by mouth 3 (three) times daily. 60 tablet 5  . omeprazole (PRILOSEC) 40 MG capsule Take 1 capsule (40 mg total) by mouth daily. 30 capsule 3  . oxyCODONE-acetaminophen (PERCOCET) 7.5-325 MG tablet Take 1 tablet by mouth every 4 (four) hours as needed. 60 tablet 0   No current facility-administered medications on file prior to visit.     Allergies  Allergen Reactions  . Shellfish Allergy Anaphylaxis    Social History   Socioeconomic History  . Marital status: Divorced    Spouse name: Not on file  . Number of children: Not on file  . Years of education: Not on file  . Highest education level: Not on file  Occupational History  . Not on file  Social Needs  . Financial resource strain: Not on file  . Food insecurity:    Worry: Not on file    Inability: Not on file  . Transportation needs:    Medical: Not on file    Non-medical: Not on file  Tobacco Use  . Smoking status: Former Smoker    Packs/day: 0.70    Types: Cigarettes    Last attempt to quit: 12/24/2017    Years since quitting: 0.3  . Smokeless tobacco: Never Used  . Tobacco  comment: patient has been  without a cigarette since 12/27/17  Substance and Sexual Activity  . Alcohol use: No  . Drug use: Yes    Types: Marijuana    Comment: 3grams a day.   Marland Kitchen. Sexual activity: Not on file  Lifestyle  . Physical activity:    Days per week: Not on file    Minutes per session: Not on file  . Stress: Not on file  Relationships  . Social connections:    Talks on phone: Not on file    Gets together: Not on file    Attends religious service: Not on file    Active member of club or organization: Not on file    Attends  meetings of clubs or organizations: Not on file    Relationship status: Not on file  . Intimate partner violence:    Fear of current or ex partner: Not on file    Emotionally abused: Not on file    Physically abused: Not on file    Forced sexual activity: Not on file  Other Topics Concern  . Not on file  Social History Narrative  . Not on file    Family History  Problem Relation Age of Onset  . Healthy Mother   . Healthy Father     Past Surgical History:  Procedure Laterality Date  . ANTERIOR CERVICAL DECOMP/DISCECTOMY FUSION N/A 04/02/2018   Procedure: ANTERIOR CERVICAL DECOMPRESSION/DISCECTOMY FUSION CERVICAL SIX- CERVICAL SEVEN ;  Surgeon: Shawn Raymond, Neelesh, MD;  Location: [facility] OR;  Service: Neurosurgery;  Laterality: N/A;  . FEMUR FRACTURE SURGERY    . PATELLA RECONSTRUCTION    . POSTERIOR CERVICAL FUSION/FORAMINOTOMY N/A 04/02/2018   Procedure: CERVICAL THREE- CERVICAL FOUR, CERVICAL FOUR- CERVICAL FIVE, CERVICAL FIVE- CERVICAL SIX, CERVICAL SIX- CERVICAL SEVEN LAMINECTOMY WITH LATERAL MASS POSTERIOR SEGMENTAL INSTRUEMNTATION, POSTERIOR LATERAL ARTHRODESIS;  Surgeon: Shawn Raymond, Neelesh, MD;  Location: [facility] OR;  Service: Neurosurgery;  Laterality: N/A;    ROS: Review of Systems Negative except as stated above   PHYSICAL EXAM: BP 129/67 (BP Location: Left Arm, Patient Position: Sitting, Cuff Size: Normal)   Pulse (!) 57   Temp 97.8 F (36.6 C) (Oral)   Ht 5\' 6"  (1.676 m)   Wt 191 lb 12.8 oz (87 kg)   SpO2 95%   BMI 30.96 kg/m   Physical Exam  General appearance - alert, well appearing, middle-aged African-American male and in no distress Mental status - normal mood, behavior, speech, dress, motor activity, and thought processes Chest - clear to auscultation, no wheezes, rales or rhonchi, symmetric air entry Heart - normal rate, regular rhythm, normal S1, S2, no murmurs, rubs, clicks or gallops Neurological -patient wearing a neck collar.  Cranial nerves are grossly  intact.  Power 5/5 in all fours.  Gross sensation intact.  ASSESSMENT AND PLAN:  1. Episodic cluster headache, not intractable Headaches seem consistent with cluster type headaches.  We discussed diagnosis and best treatment being oxygen.  Since he is not having a headache at this time I did not try him with oxygen but have prescribed some Imitrex for him to take as needed.  He will follow-up in a few weeks.  If no improvement or worsening, will image the head and start prophylactic therapy with verapamil - SUMAtriptan (IMITREX) 50 MG tablet; Take 1 tab at the start of the headache.  May repeat in 2 hours if no response. Limit to 2 tabs/24 hrs  Dispense: 10 tablet; Refill: 1  2. Cervical disc disorder with radiculopathy  of cervical region Followed by neurosurgery  3. Influenza vaccination declined   Patient was given the opportunity to ask questions.  Patient verbalized understanding of the plan and was able to repeat key elements of the plan.   No orders of the defined types were placed in this encounter.    Requested Prescriptions    No prescriptions requested or ordered in this encounter    No follow-ups on file.  Jonah Blue, MD, FACP

## 2018-05-14 ENCOUNTER — Ambulatory Visit: Payer: No Typology Code available for payment source | Attending: Physician Assistant | Admitting: Physical Therapy

## 2018-05-14 ENCOUNTER — Encounter: Payer: Self-pay | Admitting: Physical Therapy

## 2018-05-14 DIAGNOSIS — M542 Cervicalgia: Secondary | ICD-10-CM | POA: Insufficient documentation

## 2018-05-14 DIAGNOSIS — R293 Abnormal posture: Secondary | ICD-10-CM

## 2018-05-14 MED FILL — SUMATRIPTAN SUCC 50 MG TAB: 50 | 30 days supply | Qty: 9 | Fill #0

## 2018-05-14 NOTE — Therapy (Signed)
[facility] [address] [place], [place], [address] Phone: [phone]   Fax:  [phone]  Physical Therapy Evaluation  Patient Details  Name: Edwin DadaClarence R Claiborne MRN: [id number] Date of Birth: 1980/05/31 Referring Provider (PT): [facility] Cost[facility] PA-C.   Encounter Date: 05/14/2018  PT End of Session - 05/14/18 1437    Visit Number  1    Number of Visits  16    Date for PT Re-Evaluation  07/09/18    Authorization Type  PROGRESS NOTE AT 10TH VISIT.  (Pended)     PT Start Time  0105    PT Stop Time  0158    PT Time Calculation (min)  53 min    Activity Tolerance  Patient tolerated treatment well    Behavior During Therapy  WFL for tasks assessed/performed       Past Medical History:  Diagnosis Date  . Asthma   . Sarcoidosis     Past Surgical History:  Procedure Laterality Date  . ANTERIOR CERVICAL DECOMP/DISCECTOMY FUSION N/A 04/02/2018   Procedure: ANTERIOR CERVICAL DECOMPRESSION/DISCECTOMY FUSION CERVICAL SIX- CERVICAL SEVEN ;  Surgeon: Lisbeth RenshawNundkumar, Neelesh, MD;  Location: [facility] OR;  Service: Neurosurgery;  Laterality: N/A;  . FEMUR FRACTURE SURGERY    . PATELLA RECONSTRUCTION    . POSTERIOR CERVICAL FUSION/FORAMINOTOMY N/A 04/02/2018   Procedure: CERVICAL THREE- CERVICAL FOUR, CERVICAL FOUR- CERVICAL FIVE, CERVICAL FIVE- CERVICAL SIX, CERVICAL SIX- CERVICAL SEVEN LAMINECTOMY WITH LATERAL MASS POSTERIOR SEGMENTAL INSTRUEMNTATION, POSTERIOR LATERAL ARTHRODESIS;  Surgeon: Lisbeth RenshawNundkumar, Neelesh, MD;  Location: [facility] OR;  Service: Neurosurgery;  Laterality: N/A;    There were no vitals filed for this visit.   Subjective Assessment - 05/14/18 1451    Subjective  The patient underwent a cervical fusion surgery on 04/02/18 (C3 to C7).  He states he is pleased with the results of his surgery thus far.  His pain-level today was rated at a 6/10 and he still is experiencing some numbness and tingling in his hands.  He was wearing his neck brace  upon presentation to the clinic today.  He states he takes it off on occasions at home while seated.  Riding in a car and a lot of walking and movement increases his pain.  Rest and pain medications decrease his pain.    Patient is accompained by:  Family member   Wife.   Patient Stated Goals  Want to get back to weight lifting.    Currently in Pain?  Yes    Pain Score  6     Pain Location  Neck    Pain Descriptors / Indicators  Aching;Tightness;Throbbing    Pain Type  Chronic pain    Pain Onset  More than a month ago    Pain Frequency  Constant    Aggravating Factors   See above.    Pain Relieving Factors  See above.         [facility] - 05/14/18 0001      Assessment   Medical Diagnosis  Spinal stenosis, cervical region.    Referring Provider (PT)  Cindra PresumeVincent Costella PA-C.    Onset Date/Surgical Date  --   04/02/18 (surgery date).     Precautions   Precautions  --   PLEASE FOLLOW CERVICAL FUSION PROTOCOL.     Restrictions   Weight Bearing Restrictions  No      Balance Screen   Has the patient fallen in the past 6 months  Yes    How many times?  --  3.   Has the patient had a decrease in activity level because of a fear of falling?   Yes    Is the patient reluctant to leave their home because of a fear of falling?   Yes      Home Environment   Living Environment  Private residence      Prior Function   Level of Independence  Independent      Observation/Other Assessments   Observations  Anterior and posterior cervical incisions appear to be well healed.      Posture/Postural Control   Posture/Postural Control  Postural limitations    Postural Limitations  Rounded Shoulders;Forward head      ROM / Strength   AROM / PROM / Strength  AROM;PROM;Strength      AROM   Overall AROM Comments  Normal bilateral elbow and wrist ROM.  Gentlle and pain-free bilateral cervical rotation= 10 degrees.      PROM   Overall PROM Comments  Full passive bilateral shoulder  ROM.      Strength   Overall Strength Comments  Bilateral elbow and grip strength is essentially normal.      Palpation   Palpation comment  Patient tender to palpation diffusely over bilateral cervical paraspinal muculature and UT's with increased tone.      Special Tests   Other special tests  Normal bilateral UE DTR's.      Ambulation/Gait   Gait Comments  Patient with an essentially normal gait pattern.                Objective measurements completed on examination: See above findings.      [facility] Adult PT Treatment/Exercise - 05/14/18 0001      Modalities   Modalities  Electrical Stimulation;Moist Heat      Moist Heat Therapy   Number Minutes Moist Heat  15 Minutes    Moist Heat Location  Cervical      Electrical Stimulation   Electrical Stimulation Location  Bilateral cervical region.    Electrical Stimulation Action  Non-motoric Pre-mod to both sides.    Electrical Stimulation Parameters  80-150 Hz x 15 minutes (5 minutes on and 5 minutes off).    Electrical Stimulation Goals  Tone;Pain                  PT Long Term Goals - 05/14/18 1536      PT LONG TERM GOAL #1   Title  Independent with a HEP.    Time  8    Period  Weeks    Status  New      PT LONG TERM GOAL #2   Title  Eliminate bilateral UE symptoms.    Time  8    Period  Weeks    Status  New      PT LONG TERM GOAL #3   Title  Perform ADL's with pain not > 3/10.    Time  8    Period  Weeks    Status  New      PT LONG TERM GOAL #4   Title  Increase active cervical rotation to 55-60 degrees+ so patient can turn head more easily while driving.    Time  8    Period  Weeks    Status  New             Plan - 05/14/18 1509    Clinical Impression Statement  The patient is s/p 6 weeks cervcial fusion surgery level  C3 to C7.  He states he has been compliant to wearing his neck brace.  He is pleased thus far.  He reports continued neck pain and some numbness and tingling into  both hands.  His UE DTR's are normal and his bilateral UE elbow and grip strength is esentially normal as well.  Gentle active (pain-free) cervical rotation is severely limited at this time.  He has considerable tone over bilateral cervical paraspinal musculature and UT's.  Patient will benefit from skilled physical therapy intervention to address deficits and pain.         History and Personal Factors relevant to plan of care:  Sarcodosis, patellar reconstruction, femoral fracture.    Clinical Presentation  Stable    Clinical Presentation due to:  Good surgical outcome.    Clinical Decision Making  Low    Rehab Potential  Excellent    PT Frequency  2x / week    PT Duration  8 weeks    PT Treatment/Interventions  ADLs/Self Care Home Management;Cryotherapy;Electrical Stimulation;Moist Heat;Ultrasound;Therapeutic exercise;Therapeutic activities;Patient/family education;Manual techniques    PT Next Visit Plan  Please follow cervical fusion protocol, modalites as needed and gentle STW/M.    Consulted and Agree with Plan of Care  Patient       Patient will benefit from skilled therapeutic intervention in order to improve the following deficits and impairments:  Pain, Decreased activity tolerance, Increased muscle spasms, Postural dysfunction  Visit Diagnosis: Cervicalgia - Plan: PT plan of care cert/re-cert  Abnormal posture - Plan: PT plan of care cert/re-cert     Problem List Patient Active Problem List   Diagnosis Date Noted  . Stenosis of cervical spine with myelopathy (HCC) 04/02/2018  . Cervical disc disorder with radiculopathy of cervical region 01/25/2018  . Spinal stenosis in cervical region 01/25/2018  . Cervicalgia 01/25/2018  . Heartburn 01/25/2018  . FRACTURE, ANKLE, RIGHT 08/09/2009  . ANKLE SPRAIN, RIGHT 08/09/2009  . COCAINE ABUSE 08/03/2009  . TOBACCO ABUSE 08/02/2009  . 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 INTRINSIC ASTHMA, UNSPECIFIED 03/23/2009  . Nonspecific  (abnormal) findings on radiological and other examination of body structure 01/07/2009  . CT, CHEST, ABNORMAL 01/07/2009    Felishia Wartman, Italy[place]AD MPT 05/14/2018, 3:41 PM  [facility] [address] [place], [place], [address] Phone: [phone]   Fax:  [phone]  Name: Edwin DadaClarence R Masterson MRN: [id number] Date of Birth: 1980/06/14

## 2018-05-16 ENCOUNTER — Telehealth (INDEPENDENT_AMBULATORY_CARE_PROVIDER_SITE_OTHER): Payer: Self-pay | Admitting: Physician Assistant

## 2018-05-16 DIAGNOSIS — M5412 Radiculopathy, cervical region: Secondary | ICD-10-CM

## 2018-05-16 MED FILL — SYMBICORT 160-4.5 MCG INH: 160-4.5 | 30 days supply | Qty: 10 | Fill #6

## 2018-05-16 NOTE — Telephone Encounter (Signed)
Patient called to request med refill methocarbamol (ROBAXIN) 500 MG tablet  Patient uses [facility]  Please advise   thank you Shawn Raymond

## 2018-05-17 ENCOUNTER — Ambulatory Visit: Payer: No Typology Code available for payment source | Admitting: Physical Therapy

## 2018-05-17 MED ORDER — METHOCARBAMOL 500 MG PO TABS: 500.0000 mg | ORAL_TABLET | Freq: Three times a day (TID) | ORAL | 1 refills | Status: DC

## 2018-05-17 MED FILL — METHOCARBAMOL 500 MG TABS: 500 | 20 days supply | Qty: 60 | Fill #0

## 2018-05-17 NOTE — Telephone Encounter (Signed)
Sent to Dr. Newlin. Emmersyn Kratzke S Caia Lofaro, CMA  

## 2018-05-17 NOTE — Telephone Encounter (Signed)
Done

## 2018-05-17 NOTE — Telephone Encounter (Signed)
Patient is aware that refill has been sent. Maryjean Morn, CMA

## 2018-05-21 ENCOUNTER — Ambulatory Visit: Payer: No Typology Code available for payment source | Attending: Physician Assistant | Admitting: Physical Therapy

## 2018-05-21 ENCOUNTER — Encounter: Payer: Self-pay | Admitting: Physical Therapy

## 2018-05-21 DIAGNOSIS — M542 Cervicalgia: Secondary | ICD-10-CM

## 2018-05-21 DIAGNOSIS — R293 Abnormal posture: Secondary | ICD-10-CM | POA: Insufficient documentation

## 2018-05-21 NOTE — Therapy (Signed)
[facility] [name] [place], [place], [address] Phone: [phone]   Fax:  [phone]  Physical Therapy Treatment  Patient Details  Name: Shawn Raymond MRN: [id number] Date of Birth: [birth year] Referring Provider (PT): [facility] Cost[facility].   Encounter Date: 05/21/2018  PT End of Session - 05/21/18 1444    Visit Number  2    Number of Visits  16    Date for PT Re-Evaluation  07/09/18    PT Start Time  1300    PT Stop Time  1400    PT Time Calculation (min)  60 min    Activity Tolerance  Patient tolerated treatment well    Behavior During Therapy  [facility] for tasks assessed/performed       Past Medical History:  Diagnosis Date  . Asthma   . Sarcoidosis     Past Surgical History:  Procedure Laterality Date  . ANTERIOR CERVICAL DECOMP/DISCECTOMY FUSION N/A 04/02/2018   Procedure: ANTERIOR CERVICAL DECOMPRESSION/DISCECTOMY FUSION CERVICAL SIX- CERVICAL SEVEN ;  Surgeon: Lisbeth RenshawNundkumar, Neelesh, MD;  Location: [facility] OR;  Service: Neurosurgery;  Laterality: N/A;  . FEMUR FRACTURE SURGERY    . PATELLA RECONSTRUCTION    . POSTERIOR CERVICAL FUSION/FORAMINOTOMY N/A 04/02/2018   Procedure: CERVICAL THREE- CERVICAL FOUR, CERVICAL FOUR- CERVICAL FIVE, CERVICAL FIVE- CERVICAL SIX, CERVICAL SIX- CERVICAL SEVEN LAMINECTOMY WITH LATERAL MASS POSTERIOR SEGMENTAL INSTRUEMNTATION, POSTERIOR LATERAL ARTHRODESIS;  Surgeon: Lisbeth RenshawNundkumar, Neelesh, MD;  Location: [facility] OR;  Service: Neurosurgery;  Laterality: N/A;    There were no vitals filed for this visit.  Subjective Assessment - 05/21/18 1301    Subjective  Patient reports feeling better every day but pain is currently at a 6/10. Patient reported his neck feels weak when he gets out of the brace.    Patient is accompained by:  Family member   Wife   Patient Stated Goals  Want to get back to weight lifting.    Currently in Pain?  Yes    Pain Score  6     Pain Location  Neck    Pain Orientation   Anterior;Posterior    Pain Descriptors / Indicators  Aching    Pain Type  Surgical pain;Chronic pain    Pain Onset  More than a month ago    Pain Frequency  Constant         [facility] PT Assessment - 05/21/18 0001      Assessment   Medical Diagnosis  Spinal stenosis, cervical region.    Referring Provider (PT)  Cindra PresumeVincent Costella PA-C.    Next MD Visit  05/27/2018      Precautions   Precautions  Cervical    Precaution Comments  Follow Cervical fusion protocol       Home Environment   Living Environment  Private residence                   [facility] PT Treatment/Exercise - 05/21/18 0001      Exercises   Exercises  Neck      Neck Exercises: Seated   Neck Retraction  10 reps;3 secs    Cervical Rotation  Both;10 reps    Other Seated Exercise  scapular retractions 5" hold x10      Moist Heat Therapy   Number Minutes Moist Heat  15 Minutes    Moist Heat Location  Cervical      Electrical Stimulation   Electrical Stimulation Location  Bilateral cervical region.    Engineer, manufacturinglectrical Stimulation Action  Non-motoric Pre-mod bilaterally    Electrical Stimulation Parameters  80-150 hz x15 min (5 on/5 off_      Manual Therapy   Manual Therapy  Soft tissue mobilization    Soft tissue mobilization  STW/M to bilateral UTs, as well as upper thoracic paraspinals. Scar massaging to improve scar mobility                   PT Long Term Goals - 05/14/18 1536      PT LONG TERM GOAL #1   Title  Independent with a HEP.    Time  8    Period  Weeks    Status  New      PT LONG TERM GOAL #2   Title  Eliminate bilateral UE symptoms.    Time  8    Period  Weeks    Status  New      PT LONG TERM GOAL #3   Title  Perform ADL's with pain not > 3/10.    Time  8    Period  Weeks    Status  New      PT LONG TERM GOAL #4   Title  Increase active cervical rotation to 55-60 degrees+ so patient can turn head more easily while driving.    Time  8    Period  Weeks    Status   New            Plan - 05/21/18 1444    Clinical Impression Statement  Patient was able tolerate treatment well and demonstrated good form with all exercises. Patient and wife were educated on importance of pain-free exercises as well as educated on scar massaging. Patient and patient's wife reported understanding. Patient provided with chin tucks, scapular retractions and pain free cervical rotation for HEP. Normal response to modalities upon removal.     Clinical Presentation  Stable    Clinical Decision Making  Low    Rehab Potential  Excellent    PT Frequency  2x / week    PT Duration  8 weeks    PT Treatment/Interventions  ADLs/Self Care Home Management;Cryotherapy;Electrical Stimulation;Moist Heat;Ultrasound;Therapeutic exercise;Therapeutic activities;Patient/family education;Manual techniques    PT Next Visit Plan  Please follow cervical fusion protocol, modalites as needed and gentle STW/M.    Consulted and Agree with Plan of Care  Patient       Patient will benefit from skilled therapeutic intervention in order to improve the following deficits and impairments:  Pain, Decreased activity tolerance, Increased muscle spasms, Postural dysfunction  Visit Diagnosis: Cervicalgia  Abnormal posture     Problem List Patient Active Problem List   Diagnosis Date Noted  . Stenosis of cervical spine with myelopathy (HCC) 04/02/2018  . Cervical disc disorder with radiculopathy of cervical region 01/25/2018  . Spinal stenosis in cervical region 01/25/2018  . Cervicalgia 01/25/2018  . Heartburn 01/25/2018  . FRACTURE, ANKLE, RIGHT 08/09/2009  . ANKLE SPRAIN, RIGHT 08/09/2009  . COCAINE ABUSE 08/03/2009  . TOBACCO ABUSE 08/02/2009  . 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 INTRINSIC ASTHMA, UNSPECIFIED 03/23/2009  . Nonspecific (abnormal) findings on radiological and other examination of body structure 01/07/2009  . CT, CHEST, ABNORMAL 01/07/2009    Guss Bunde, PT,  DPT 05/21/2018, 2:55 PM  [facility] [address] [place], [place], [address] Phone: [phone]   Fax:  [phone]  Name: Shawn Raymond MRN: [id number] Date of Birth: 1980/12/23

## 2018-05-23 ENCOUNTER — Ambulatory Visit: Payer: No Typology Code available for payment source | Admitting: Physical Therapy

## 2018-05-27 ENCOUNTER — Ambulatory Visit: Payer: No Typology Code available for payment source | Admitting: Physical Therapy

## 2018-05-27 DIAGNOSIS — R293 Abnormal posture: Secondary | ICD-10-CM

## 2018-05-27 DIAGNOSIS — M542 Cervicalgia: Secondary | ICD-10-CM

## 2018-05-27 NOTE — Therapy (Signed)
[facility] [address] [place], [place], [address] Phone: [phone]   Fax:  [phone]  Physical Therapy Treatment  Patient Details  Name: Shawn Raymond MRN: [id number] Date of Birth: 01/09/1981 Referring Provider (PT[facility].   Encounter Date: 05/27/2018  PT End of Session - 05/27/18 1647    Visit Number  3    Number of Visits  16    Date for PT Re-Evaluation  07/09/18    PT Start Time  0315    PT Stop Time  0409    PT Time Calculation (min)  54 min    Activity Tolerance  Patient tolerated treatment well    Behavior During Therapy  [facility] for tasks assessed/performed       Past Medical History:  Diagnosis Date  . Asthma   . Sarcoidosis     Past Surgical History:  Procedure Laterality Date  . ANTERIOR CERVICAL DECOMP/DISCECTOMY FUSION N/A 04/02/2018   Procedure: ANTERIOR CERVICAL DECOMPRESSION/DISCECTOMY FUSION CERVICAL SIX- CERVICAL SEVEN ;  Surgeon: Lisbeth RenshawNundkumar, Neelesh, MD;  [facility] OR;  Service: Neurosurgery;  Laterality: N/A;  . FEMUR FRACTURE SURGERY    . PATELLA RECONSTRUCTION    . POSTERIOR CERVICAL FUSION/FORAMINOTOMY N/A 04/02/2018   Procedure: CERVICAL THREE- CERVICAL FOUR, CERVICAL FOUR- CERVICAL FIVE, CERVICAL FIVE- CERVICAL SIX, CERVICAL SIX- CERVICAL SEVEN LAMINECTOMY WITH LATERAL MASS POSTERIOR SEGMENTAL INSTRUEMNTATION, POSTERIOR LATERAL ARTHRODESIS;  Surgeon: Lisbeth RenshawNundkumar, Neelesh, MD;  [facility] OR;  Service: Neurosurgery;  Laterality: N/A;    There were no vitals filed for this visit.  Subjective Assessment - 05/27/18 1638    Subjective  I was supposed to see the doctor today but was re-scheduled until next week.    Patient Stated Goals  Want to get back to weight lifting.    Currently in Pain?  Yes    Pain Score  6     Pain Location  Neck    Pain Orientation  Anterior;Posterior    Pain Type  Surgical pain;Chronic pain    Pain Onset  More than a month ago                        [facility] Adult PT Treatment/Exercise - 05/27/18 0001      Modalities   Modalities  Electrical Stimulation;Moist Heat;Ultrasound      Moist Heat Therapy   Number Minutes Moist Heat  20 Minutes    Moist Heat Location  Cervical      Electrical Stimulation   Electrical Stimulation Location  Bilateral cervical region.    Electrical Stimulation Action  Non-motoric pre-mod    Electrical Stimulation Parameters  80-150 Hz x 20 minutes (5 sec on and 5 sec off).      Ultrasound   Ultrasound Location  Bilateral cervical region.    Ultrasound Parameters  1.50 W/CM2 x 12 minutes.    Ultrasound Goals  Pain      Manual Therapy   Manual Therapy  Soft tissue mobilization    Soft tissue mobilization  STW/M to bilateral cervical paraspinal musculature to reduce tone x 12 minutes.                  PT Long Term Goals - 05/14/18 1536      PT LONG TERM GOAL #1   Title  Independent with a HEP.    Time  8    Period  Weeks    Status  New      PT  LONG TERM GOAL #2   Title  Eliminate bilateral UE symptoms.    Time  8    Period  Weeks    Status  New      PT LONG TERM GOAL #3   Title  Perform ADL's with pain not > 3/10.    Time  8    Period  Weeks    Status  New      PT LONG TERM GOAL #4   Title  Increase active cervical rotation to 55-60 degrees+ so patient can turn head more easily while driving.    Time  8    Period  Weeks    Status  New            Plan - 05/27/18 1642    Clinical Impression Statement  Patient did well with treatment today.  Cervical musculature with continued increased tone.    PT Treatment/Interventions  ADLs/Self Care Home Management;Cryotherapy;Electrical Stimulation;Moist Heat;Ultrasound;Therapeutic exercise;Therapeutic activities;Patient/family education;Manual techniques    PT Next Visit Plan  Please follow cervical fusion protocol, modalites as needed and gentle STW/M.    Consulted and Agree with Plan of Care   Patient       Patient will benefit from skilled therapeutic intervention in order to improve the following deficits and impairments:  Pain, Decreased activity tolerance, Increased muscle spasms, Postural dysfunction  Visit Diagnosis: Cervicalgia  Abnormal posture     Problem List Patient Active Problem List   Diagnosis Date Noted  . Stenosis of cervical spine with myelopathy (HCC) 04/02/2018  . Cervical disc disorder with radiculopathy of cervical region 01/25/2018  . Spinal stenosis in cervical region 01/25/2018  . Cervicalgia 01/25/2018  . Heartburn 01/25/2018  . FRACTURE, ANKLE, RIGHT 08/09/2009  . ANKLE SPRAIN, RIGHT 08/09/2009  . COCAINE ABUSE 08/03/2009  . TOBACCO ABUSE 08/02/2009  . 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 INTRINSIC ASTHMA, UNSPECIFIED 03/23/2009  . Nonspecific (abnormal) findings on radiological and other examination of body structure 01/07/2009  . CT, CHEST, ABNORMAL 01/07/2009    Mikah Rottinghaus, ItalyH[place] MPT 05/27/2018, 4:48 PM  [facility] [address] [place], [place], [address] Phone: [phone]   Fax:  [phone]  Name: Shawn Raymond MRN: [id number] Date of Birth: [birth year]

## 2018-05-30 ENCOUNTER — Ambulatory Visit: Payer: No Typology Code available for payment source | Admitting: Physical Therapy

## 2018-05-30 ENCOUNTER — Encounter: Payer: Self-pay | Admitting: Physical Therapy

## 2018-05-30 DIAGNOSIS — R293 Abnormal posture: Secondary | ICD-10-CM

## 2018-05-30 DIAGNOSIS — M542 Cervicalgia: Secondary | ICD-10-CM

## 2018-05-30 NOTE — Therapy (Signed)
[facility] [address] [place], [place], [address] Phone: [phone]   Fax:  [phone]  Physical Therapy Treatment  Patient Details  Name: Shawn Raymond MRN: [id number] Date of Birth: November 12, 1980 Referring Provider (PT): [facility].   Encounter Date: 05/30/2018  PT End of Session - 05/30/18 1045    Visit Number  4    Number of Visits  16    Date for PT Re-Evaluation  07/09/18    PT Start Time  1045    PT Stop Time  1142    PT Time Calculation (min)  57 min    Activity Tolerance  Patient tolerated treatment well    Behavior During Therapy  WFL for tasks assessed/performed       Past Medical History:  Diagnosis Date  . Asthma   . Sarcoidosis     Past Surgical History:  Procedure Laterality Date  . ANTERIOR CERVICAL DECOMP/DISCECTOMY FUSION N/A 04/02/2018   Procedure: ANTERIOR CERVICAL DECOMPRESSION/DISCECTOMY FUSION CERVICAL SIX- CERVICAL SEVEN ;  Surgeon: Shawn RenshawNundkumar, Neelesh, MD;  Location[facility] OR;  Service: Neurosurgery;  Laterality: N/A;  . FEMUR FRACTURE SURGERY    . PATELLA RECONSTRUCTION    . POSTERIOR CERVICAL FUSION/FORAMINOTOMY N/A 04/02/2018   Procedure: CERVICAL THREE- CERVICAL FOUR, CERVICAL FOUR- CERVICAL FIVE, CERVICAL FIVE- CERVICAL SIX, CERVICAL SIX- CERVICAL SEVEN LAMINECTOMY WITH LATERAL MASS POSTERIOR SEGMENTAL INSTRUEMNTATION, POSTERIOR LATERAL ARTHRODESIS;  Surgeon: Shawn RenshawNundkumar, Neelesh, MD;  Location: [facility] OR;  Service: Neurosurgery;  Laterality: N/A;    There were no vitals filed for this visit.  Subjective Assessment - 05/30/18 1044    Subjective  Reports "normal" soreness. Wife commented that he looked shaky which he denied. Reports a grinding sound intermittantly.    Patient is accompained by:  Family member   Shawn Raymond   Patient Stated Goals  Want to get back to weight lifting.    Currently in Pain?  Yes    Pain Score  6     Pain Location  Neck    Pain Orientation  Right;Left    Pain  Descriptors / Indicators  Sore    Pain Type  Surgical pain    Pain Onset  More than a month ago    Pain Frequency  Constant         [facility] PT Assessment - 05/30/18 0001      Assessment   Medical Diagnosis  Spinal stenosis, cervical region.    Referring Provider (PT)  Shawn PresumeVincent Costella PA-C.      Precautions   Precautions  Cervical    Precaution Comments  Follow Cervical fusion protocol                    [facility] Adult PT Treatment/Exercise - 05/30/18 0001      Modalities   Modalities  Electrical Stimulation;Moist Heat      Moist Heat Therapy   Number Minutes Moist Heat  15 Minutes    Moist Heat Location  Cervical      Electrical Stimulation   Electrical Stimulation Location  Bilateral cervical region.    Electrical Stimulation Action  IFC    Electrical Stimulation Parameters  80-150 hz x15 min    Electrical Stimulation Goals  Tone;Pain      Manual Therapy   Manual Therapy  Soft tissue mobilization    Soft tissue mobilization  STW to B UT, cervical paraspinals along with scar massage to reduce adhesions, muscle tightness and pain  PT Long Term Goals - 05/30/18 1154      PT LONG TERM GOAL #1   Title  Independent with a HEP.    Time  8    Period  Weeks    Status  Achieved      PT LONG TERM GOAL #2   Title  Eliminate bilateral UE symptoms.    Time  8    Period  Weeks    Status  On-going      PT LONG TERM GOAL #3   Title  Perform ADL's with pain not > 3/10.    Time  8    Period  Weeks    Status  On-going      PT LONG TERM GOAL #4   Title  Increase active cervical rotation to 55-60 degrees+ so patient can turn head more easily while driving.    Time  8    Period  Weeks    Status  On-going            Plan - 05/30/18 1140    Clinical Impression Statement  Patient tolerated today's treatment well with only complaints of soreness and also still has some grinding sensation intermittantly. Patient's family member/friend  present, Shawn Raymond, reports that at times during cervical rotation from HEP that muscle restrictions such as tightness. More muscle tightness palpable in R cervical musculature. Shawn Raymond also reported tighter area of scar in mid incision. Moderate release of muscle tightness noted during manual therapy session. Shawn Raymond and patient educated regarding proper scar mobilizations and various techniques of scar mobilizations. Normal modalities response noted following removal of the modalities.     Rehab Potential  Excellent    PT Frequency  2x / week    PT Duration  8 weeks    PT Treatment/Interventions  ADLs/Self Care Home Management;Cryotherapy;Electrical Stimulation;Moist Heat;Ultrasound;Therapeutic exercise;Therapeutic activities;Patient/family education;Manual techniques    PT Next Visit Plan  Please follow cervical fusion protocol, modalites as needed and gentle STW/M.    Consulted and Agree with Plan of Care  Patient       Patient will benefit from skilled therapeutic intervention in order to improve the following deficits and impairments:  Pain, Decreased activity tolerance, Increased muscle spasms, Postural dysfunction  Visit Diagnosis: Cervicalgia  Abnormal posture     Problem List Patient Active Problem List   Diagnosis Date Noted  . Stenosis of cervical spine with myelopathy (HCC) 04/02/2018  . Cervical disc disorder with radiculopathy of cervical region 01/25/2018  . Spinal stenosis in cervical region 01/25/2018  . Cervicalgia 01/25/2018  . Heartburn 01/25/2018  . FRACTURE, ANKLE, RIGHT 08/09/2009  . ANKLE SPRAIN, RIGHT 08/09/2009  . COCAINE ABUSE 08/03/2009  . TOBACCO ABUSE 08/02/2009  . 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 INTRINSIC ASTHMA, UNSPECIFIED 03/23/2009  . Nonspecific (abnormal) findings on radiological and other examination of body structure 01/07/2009  . CT, CHEST, ABNORMAL 01/07/2009    Shawn Raymond, PTA 05/30/2018, 11:56 AM  [facility] [address] [place], [place], [address] Phone: [phone]   Fax:  [phone]  Name: Shawn Raymond MRN: [id number] Date of Birth: [birth year]

## 2018-05-31 MED FILL — METHOCARBAMOL 500 MG TABS: 500 | 20 days supply | Qty: 60 | Fill #1

## 2018-05-31 MED FILL — $VENTOLIN HFA 18G INHALER: 108 (90 BAS | 49 days supply | Qty: 36 | Fill #1

## 2018-06-04 ENCOUNTER — Ambulatory Visit: Payer: No Typology Code available for payment source | Admitting: Physical Therapy

## 2018-06-05 ENCOUNTER — Ambulatory Visit (INDEPENDENT_AMBULATORY_CARE_PROVIDER_SITE_OTHER): Payer: No Typology Code available for payment source | Admitting: Primary Care

## 2018-06-06 ENCOUNTER — Encounter (INDEPENDENT_AMBULATORY_CARE_PROVIDER_SITE_OTHER): Payer: Self-pay | Admitting: Primary Care

## 2018-06-06 ENCOUNTER — Encounter: Payer: Self-pay | Admitting: Physical Therapy

## 2018-06-06 ENCOUNTER — Ambulatory Visit (INDEPENDENT_AMBULATORY_CARE_PROVIDER_SITE_OTHER): Payer: No Typology Code available for payment source | Admitting: Primary Care

## 2018-06-06 ENCOUNTER — Other Ambulatory Visit: Payer: Self-pay

## 2018-06-06 ENCOUNTER — Ambulatory Visit: Payer: No Typology Code available for payment source | Admitting: Physical Therapy

## 2018-06-06 VITALS — BP 132/84 | HR 72 | Temp 97.7°F | Ht 66.0 in | Wt 199.0 lb

## 2018-06-06 DIAGNOSIS — G894 Chronic pain syndrome: Secondary | ICD-10-CM | POA: Diagnosis not present

## 2018-06-06 DIAGNOSIS — M5412 Radiculopathy, cervical region: Secondary | ICD-10-CM

## 2018-06-06 DIAGNOSIS — M542 Cervicalgia: Secondary | ICD-10-CM

## 2018-06-06 DIAGNOSIS — R293 Abnormal posture: Secondary | ICD-10-CM

## 2018-06-06 NOTE — Progress Notes (Signed)
New Patient Office Visit  Subjective:  Patient ID: Shawn Raymond, male    DOB: 1981/03/03  Age: 38 y.o. MRN: [id number]  CC:  Chief Complaint  Patient presents with  . Follow-up    headache  . Referral    to pain mgmt  . Medication Refill    HPI Shawn Raymond presents for referral for pain management. Advised patient to call insurance for providers that are in network than I will be able to refer to specific location.  Past Medical History:  Diagnosis Date  . Asthma   . Sarcoidosis     Past Surgical History:  Procedure Laterality Date  . ANTERIOR CERVICAL DECOMP/DISCECTOMY FUSION N/A 04/02/2018   Procedure: ANTERIOR CERVICAL DECOMPRESSION/DISCECTOMY FUSION CERVICAL SIX- CERVICAL SEVEN ;  Surgeon: Lisbeth Renshaw, MD;  Location: [facility] OR;  Service: Neurosurgery;  Laterality: N/A;  . FEMUR FRACTURE SURGERY    . PATELLA RECONSTRUCTION    . POSTERIOR CERVICAL FUSION/FORAMINOTOMY N/A 04/02/2018   Procedure: CERVICAL THREE- CERVICAL FOUR, CERVICAL FOUR- CERVICAL FIVE, CERVICAL FIVE- CERVICAL SIX, CERVICAL SIX- CERVICAL SEVEN LAMINECTOMY WITH LATERAL MASS POSTERIOR SEGMENTAL INSTRUEMNTATION, POSTERIOR LATERAL ARTHRODESIS;  Surgeon: Lisbeth Renshaw, MD;  Location: [facility] OR;  Service: Neurosurgery;  Laterality: N/A;    Family History  Problem Relation Age of Onset  . Healthy Mother   . Healthy Father     Social History   Socioeconomic History  . Marital status: Divorced    Spouse name: Not on file  . Number of children: Not on file  . Years of education: Not on file  . Highest education level: Not on file  Occupational History  . Not on file  Social Needs  . Financial resource strain: Not on file  . Food insecurity:    Worry: Not on file    Inability: Not on file  . Transportation needs:    Medical: Not on file    Non-medical: Not on file  Tobacco Use  . Smoking status: Former Smoker    Packs/day: 0.70    Types: Cigarettes    Last attempt to quit:  12/24/2017    Years since quitting: 0.4  . Smokeless tobacco: Never Used  . Tobacco comment: patient has been  without a cigarette since 12/27/17  Substance and Sexual Activity  . Alcohol use: No  . Drug use: Yes    Types: Marijuana    Comment: 3grams a day.   Marland Kitchen Sexual activity: Not on file  Lifestyle  . Physical activity:    Days per week: Not on file    Minutes per session: Not on file  . Stress: Not on file  Relationships  . Social connections:    Talks on phone: Not on file    Gets together: Not on file    Attends religious service: Not on file    Active member of club or organization: Not on file    Attends meetings of clubs or organizations: Not on file    Relationship status: Not on file  . Intimate partner violence:    Fear of current or ex partner: Not on file    Emotionally abused: Not on file    Physically abused: Not on file    Forced sexual activity: Not on file  Other Topics Concern  . Not on file  Social History Narrative  . Not on file    ROS Review of Systems  Constitutional: Negative.   Eyes: Negative.   Respiratory: Negative.   Gastrointestinal: Negative.  Endocrine: Negative.   Genitourinary: Negative.   Musculoskeletal: Positive for neck pain and neck stiffness.  Allergic/Immunologic: Negative.   Neurological: Positive for headaches.  Hematological: Negative.   Psychiatric/Behavioral: Negative.     Objective:   Today's Vitals: BP 132/84 (BP Location: Left Arm, Patient Position: Sitting, Cuff Size: Normal)   Pulse 72   Temp 97.7 F (36.5 C) (Oral)   Ht 5\' 6"  (1.676 m)   Wt 199 lb (90.3 kg)   SpO2 98%   BMI 32.12 kg/m   Physical Exam HENT:     Head: Normocephalic.     Nose: Nose normal.     Mouth/Throat:     Mouth: Mucous membranes are dry.  Neck:     Musculoskeletal: Neck rigidity and muscular tenderness present.  Cardiovascular:     Rate and Rhythm: Normal rate and regular rhythm.  Pulmonary:     Effort: Pulmonary effort is  normal.     Breath sounds: Normal breath sounds.  Abdominal:     General: Bowel sounds are normal. There is distension.  Musculoskeletal: Normal range of motion.  Skin:    General: Skin is warm and dry.  Neurological:     Mental Status: He is alert and oriented to person, place, and time.  Psychiatric:        Mood and Affect: Mood normal.        Behavior: Behavior normal.     Assessment & Plan:   Problem List Items Addressed This Visit    None    Visit Diagnoses    Cervical radiculopathy    -  Primary s/p surgery .Neurologist informed patient he needs to have pain medication filled by PCP    Chronic pain syndrome    Refer to pain clinic after   Patient calls for pain management in network.    Outpatient Encounter Medications as of 06/06/2018  Medication Sig  . albuterol (VENTOLIN HFA) 108 (90 Base) MCG/ACT inhaler INHALE 2 PUFFS INTO THE LUNGS EVERY 4 (FOUR) HOURS AS NEEDED FOR WHEEZING OR SHORTNESS OF BREATH. (Patient taking differently: Inhale 2 puffs into the lungs every 4 (four) hours as needed for wheezing or shortness of breath. )  . budesonide-formoterol (SYMBICORT) 160-4.5 MCG/ACT inhaler Inhale 2 puffs into the lungs 2 (two) times daily.  . methocarbamol (ROBAXIN) 500 MG tablet Take 1 tablet (500 mg total) by mouth 3 (three) times daily.  Marland Kitchen omeprazole (PRILOSEC) 40 MG capsule Take 1 capsule (40 mg total) by mouth daily.  . SUMAtriptan (IMITREX) 50 MG tablet Take 1 tab at the start of the headache.  May repeat in 2 hours if no response. Limit to 2 tabs/24 hrs (Patient not taking: Reported on 06/06/2018)  . [DISCONTINUED] oxyCODONE-acetaminophen (PERCOCET) 7.5-325 MG tablet Take 1 tablet by mouth every 4 (four) hours as needed.   No facility-administered encounter medications on file as of 06/06/2018.     Follow-up: Return in about 6 months (around 12/05/2018) for s/p surgery f/u cervical .   Grayce Sessions, NP

## 2018-06-06 NOTE — Therapy (Signed)
[facility] [address] [place], [place], [address] Phone: [phone]   Fax:  [phone]  Physical Therapy Treatment  Patient Details  Name: Shawn Raymond MRN: [id number] Date of Birth: 12/16/1980 Referring Provider (PT): [facility] PA-C.   Encounter Date: 06/06/2018  PT End of Session - 06/06/18 0949    Visit Number  5    Number of Visits  16    Date for PT Re-Evaluation  07/09/18    PT Start Time  0901    PT Stop Time  0953    PT Time Calculation (min)  52 min    Activity Tolerance  Patient tolerated treatment well    Behavior During Therapy  [facility] for tasks assessed/performed       Past Medical History:  Diagnosis Date  . Asthma   . Sarcoidosis     Past Surgical History:  Procedure Laterality Date  . ANTERIOR CERVICAL DECOMP/DISCECTOMY FUSION N/A 04/02/2018   Procedure: ANTERIOR CERVICAL DECOMPRESSION/DISCECTOMY FUSION CERVICAL SIX- CERVICAL SEVEN ;  Surgeon: Lisbeth Renshaw, MD;  Location: [facility] OR;  Service: Neurosurgery;  Laterality: N/A;  . FEMUR FRACTURE SURGERY    . PATELLA RECONSTRUCTION    . POSTERIOR CERVICAL FUSION/FORAMINOTOMY N/A 04/02/2018   Procedure: CERVICAL THREE- CERVICAL FOUR, CERVICAL FOUR- CERVICAL FIVE, CERVICAL FIVE- CERVICAL SIX, CERVICAL SIX- CERVICAL SEVEN LAMINECTOMY WITH LATERAL MASS POSTERIOR SEGMENTAL INSTRUEMNTATION, POSTERIOR LATERAL ARTHRODESIS;  Surgeon: Lisbeth Renshaw, MD;  Location: [facility] OR;  Service: Neurosurgery;  Laterality: N/A;    There were no vitals filed for this visit.  Subjective Assessment - 06/06/18 0901    Subjective  Patient feels tightness upon arrival arival, and "feels weird without brace on"    Patient is accompained by:  Family member    Patient Stated Goals  Want to get back to weight lifting.    Currently in Pain?  Yes    Pain Score  6     Pain Location  Neck    Pain Orientation  Right;Left    Pain Descriptors / Indicators  Sore;Tightness    Pain  Type  Surgical pain    Pain Onset  More than a month ago    Pain Frequency  Constant    Aggravating Factors   nothing at this time    Pain Relieving Factors  rest heat         [facility] PT Assessment - 06/06/18 0001      AROM   AROM Assessment Site  Cervical    Cervical - Right Rotation  20    Cervical - Left Rotation  24      Strength   Strength Assessment Site  --                   [facility] Adult PT Treatment/Exercise - 06/06/18 0001      Moist Heat Therapy   Number Minutes Moist Heat  15 Minutes    Moist Heat Location  Cervical      Electrical Stimulation   Electrical Stimulation Location  Bilateral cervical region.    Electrical Stimulation Action  IFC    Electrical Stimulation Parameters  80-150hz  x26min    Electrical Stimulation Goals  Tone;Pain      Ultrasound   Ultrasound Location  bil cervical paraspinals    Ultrasound Parameters  1.5w/cm2/50%/14mhz x62min    Ultrasound Goals  Pain      Manual Therapy   Manual Therapy  Soft tissue mobilization    Manual therapy comments  manual STW to sub occipitals to reduce pain    Soft tissue mobilization  STW to B UT, cervical paraspinals along with scar massage to reduce adhesions, muscle tightness and pain                  PT Long Term Goals - 05/30/18 1154      PT LONG TERM GOAL #1   Title  Independent with a HEP.    Time  8    Period  Weeks    Status  Achieved      PT LONG TERM GOAL #2   Title  Eliminate bilateral UE symptoms.    Time  8    Period  Weeks    Status  On-going      PT LONG TERM GOAL #3   Title  Perform ADL's with pain not > 3/10.    Time  8    Period  Weeks    Status  On-going      PT LONG TERM GOAL #4   Title  Increase active cervical rotation to 55-60 degrees+ so patient can turn head more easily while driving.    Time  8    Period  Weeks    Status  On-going            Plan - 06/06/18 0950    Clinical Impression Statement  Patient tolerated treatment well  today. Patient went to MD and is doing well overall, no more neck brace and only restriction is lifting no more than 15# per reported by patient. Patient very unsure of movemnts and feels a little "weird" without brace yet doing well. Educated patient on cervical protocol and movements at this time. Patient has tightness in bil cervical spine today and has daily headaches. Goals ongoing at this time.     PT Frequency  2x / week    PT Duration  8 weeks    PT Treatment/Interventions  ADLs/Self Care Home Management;Cryotherapy;Electrical Stimulation;Moist Heat;Ultrasound;Therapeutic exercise;Therapeutic activities;Patient/family education;Manual techniques    PT Next Visit Plan  cont per cervical fusion protocol, modalites as needed and gentle STW/M.    Consulted and Agree with Plan of Care  Patient       Patient will benefit from skilled therapeutic intervention in order to improve the following deficits and impairments:  Pain, Decreased activity tolerance, Increased muscle spasms, Postural dysfunction  Visit Diagnosis: Cervicalgia  Abnormal posture     Problem List Patient Active Problem List   Diagnosis Date Noted  . Stenosis of cervical spine with myelopathy (HCC) 04/02/2018  . Cervical disc disorder with radiculopathy of cervical region 01/25/2018  . Spinal stenosis in cervical region 01/25/2018  . Cervicalgia 01/25/2018  . Heartburn 01/25/2018  . FRACTURE, ANKLE, RIGHT 08/09/2009  . ANKLE SPRAIN, RIGHT 08/09/2009  . COCAINE ABUSE 08/03/2009  . TOBACCO ABUSE 08/02/2009  . 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 INTRINSIC ASTHMA, UNSPECIFIED 03/23/2009  . Nonspecific (abnormal) findings on radiological and other examination of body structure 01/07/2009  . CT, CHEST, ABNORMAL 01/07/2009    Shakela Donati P, PTA 06/06/2018, 10:01 AM  [facility] [address] [place], [place], [address] Phone: [phone]   Fax:   [phone]  Name: WILMONT LABARCA MRN: [id number] Date of Birth: 05/19/1980

## 2018-06-07 ENCOUNTER — Encounter: Payer: No Typology Code available for payment source | Admitting: Physical Therapy

## 2018-06-10 ENCOUNTER — Encounter: Payer: Self-pay | Admitting: Physical Therapy

## 2018-06-10 ENCOUNTER — Ambulatory Visit: Payer: No Typology Code available for payment source | Admitting: Physical Therapy

## 2018-06-10 DIAGNOSIS — M542 Cervicalgia: Secondary | ICD-10-CM

## 2018-06-10 DIAGNOSIS — R293 Abnormal posture: Secondary | ICD-10-CM

## 2018-06-10 NOTE — Therapy (Signed)
[facility] [address] [place], [place], [address] Phone: [phone]   Fax:  [phone]  Physical Therapy Treatment  Patient Details  Name: Shawn Raymond MRN: [id number] Date of Birth: 06-12-1980 Referring Provider (PT): [facility] PA-C.   Encounter Date: 06/10/2018  PT End of Session - 06/10/18 1212    Visit Number  6    Number of Visits  16    Date for PT Re-Evaluation  07/09/18    PT Start Time  1115    PT Stop Time  1223    PT Time Calculation (min)  68 min    Activity Tolerance  Patient tolerated treatment well    Behavior During Therapy  [facility] for tasks assessed/performed       Past Medical History:  Diagnosis Date  . Asthma   . Sarcoidosis     Past Surgical History:  Procedure Laterality Date  . ANTERIOR CERVICAL DECOMP/DISCECTOMY FUSION N/A 04/02/2018   Procedure: ANTERIOR CERVICAL DECOMPRESSION/DISCECTOMY FUSION CERVICAL SIX- CERVICAL SEVEN ;  Surgeon: Lisbeth Renshaw, MD;  Location: [facility] OR;  Service: Neurosurgery;  Laterality: N/A;  . FEMUR FRACTURE SURGERY    . PATELLA RECONSTRUCTION    . POSTERIOR CERVICAL FUSION/FORAMINOTOMY N/A 04/02/2018   Procedure: CERVICAL THREE- CERVICAL FOUR, CERVICAL FOUR- CERVICAL FIVE, CERVICAL FIVE- CERVICAL SIX, CERVICAL SIX- CERVICAL SEVEN LAMINECTOMY WITH LATERAL MASS POSTERIOR SEGMENTAL INSTRUEMNTATION, POSTERIOR LATERAL ARTHRODESIS;  Surgeon: Lisbeth Renshaw, MD;  Location: [facility] OR;  Service: Neurosurgery;  Laterality: N/A;    There were no vitals filed for this visit.  Subjective Assessment - 06/10/18 1120    Subjective  Patient reported doing well with ongoing stiffness, arrived with a neck pillow on    Patient is accompained by:  Family member    Patient Stated Goals  Want to get back to weight lifting.    Currently in Pain?  Yes    Pain Score  7     Pain Location  Neck    Pain Orientation  Right;Left    Pain Descriptors / Indicators  Tightness    Pain  Onset  More than a month ago    Pain Frequency  Constant    Aggravating Factors   prolong walking standing    Pain Relieving Factors  rest and heat                       [facility] Adult PT Treatment/Exercise - 06/10/18 0001      Exercises   Exercises  Shoulder;Neck      Neck Exercises: Seated   Cervical Isometrics  Flexion;Extension;Right lateral flexion;Left lateral flexion;5 secs;5 reps    Cervical Rotation  Both;5 reps    Lateral Flexion  Both;5 reps    Other Seated Exercise  scapular retractions 5" hold 2x10 with yellow t-band    Other Seated Exercise  seated pulley's      Shoulder Exercises: Seated   Other Seated Exercises  bicep/tricep pull 2x10 each with yellow t-band      Shoulder Exercises: Standing   Other Standing Exercises  wall angels modified 2x10      Moist Heat Therapy   Number Minutes Moist Heat  15 Minutes    Moist Heat Location  Cervical      Electrical Stimulation   Electrical Stimulation Location  Bilateral cervical region.    Electrical Stimulation Action  IFC    Electrical Stimulation Parameters  80-150hz  x83min    Electrical Stimulation Goals  Tone;Pain  Ultrasound   Ultrasound Location  bil cervical paraspinals    Ultrasound Parameters  1.5w/cm2/50%/69mhz x51min    Ultrasound Goals  Pain      Manual Therapy   Manual Therapy  Soft tissue mobilization    Manual therapy comments  manual STW to sub occipitals to reduce pain    Soft tissue mobilization  STW to B UT, cervical paraspinals along with scar massage to reduce adhesions, muscle tightness and pain                  PT Long Term Goals - 05/30/18 1154      PT LONG TERM GOAL #1   Title  Independent with a HEP.    Time  8    Period  Weeks    Status  Achieved      PT LONG TERM GOAL #2   Title  Eliminate bilateral UE symptoms.    Time  8    Period  Weeks    Status  On-going      PT LONG TERM GOAL #3   Title  Perform ADL's with pain not > 3/10.    Time   8    Period  Weeks    Status  On-going      PT LONG TERM GOAL #4   Title  Increase active cervical rotation to 55-60 degrees+ so patient can turn head more easily while driving.    Time  8    Period  Weeks    Status  On-going            Plan - 06/10/18 1221    Clinical Impression Statement  Patient tolerated treatment well today. Patient progressing with ROM and exercises per protocol limitatins. Patient has reported a grinding sensation that has been going on prior to surgery around C5/C6 area at times with movement. No pain from exercises today. Some tightness in cervical spine today. Goals ongoing.     Rehab Potential  Excellent    PT Frequency  2x / week    PT Duration  8 weeks    PT Treatment/Interventions  ADLs/Self Care Home Management;Cryotherapy;Electrical Stimulation;Moist Heat;Ultrasound;Therapeutic exercise;Therapeutic activities;Patient/family education;Manual techniques    PT Next Visit Plan  cont per cervical fusion protocol, modalites as needed and gentle STW/M.    Consulted and Agree with Plan of Care  Patient       Patient will benefit from skilled therapeutic intervention in order to improve the following deficits and impairments:  Pain, Decreased activity tolerance, Increased muscle spasms, Postural dysfunction  Visit Diagnosis: Cervicalgia  Abnormal posture     Problem List Patient Active Problem List   Diagnosis Date Noted  . Stenosis of cervical spine with myelopathy (HCC) 04/02/2018  . Cervical disc disorder with radiculopathy of cervical region 01/25/2018  . Spinal stenosis in cervical region 01/25/2018  . Cervicalgia 01/25/2018  . Heartburn 01/25/2018  . FRACTURE, ANKLE, RIGHT 08/09/2009  . ANKLE SPRAIN, RIGHT 08/09/2009  . COCAINE ABUSE 08/03/2009  . TOBACCO ABUSE 08/02/2009  . 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 INTRINSIC ASTHMA, UNSPECIFIED 03/23/2009  . Nonspecific (abnormal) findings on radiological and other examination of body  structure 01/07/2009  . CT, CHEST, ABNORMAL 01/07/2009    Hermelinda Dellen, PTA 06/10/2018, 12:27 PM  [facility] [address] [place], [place], [address] Phone: [phone]   Fax:  [phone]  Name: Shawn Raymond MRN: [id number] Date of Birth: [birth year]

## 2018-06-11 ENCOUNTER — Telehealth (INDEPENDENT_AMBULATORY_CARE_PROVIDER_SITE_OTHER): Payer: Self-pay | Admitting: Primary Care

## 2018-06-11 NOTE — Telephone Encounter (Signed)
Patient called to inform PCP of the pain management clinic.  [facility] for [facility], [place] [address] [place], [place], [place] [address] Phone: [phone]  He would like to see Dr. Jacklynn Barnacle [place]    Please advise [phone]  Thank you Louisa Second

## 2018-06-12 NOTE — Telephone Encounter (Signed)
Please place referral. Maryjean Morn, CMA

## 2018-06-12 NOTE — Telephone Encounter (Signed)
Patient called to inform of what pain clinic his insurance will cover him to go to. Please send referral [facility]  [address] [place] [address]  [phone]  patient would like to see Dr. Jacklynn Barnacle [place]. Maryjean Morn, CMA

## 2018-06-12 NOTE — Telephone Encounter (Signed)
Refer to pain management 

## 2018-06-13 ENCOUNTER — Ambulatory Visit: Payer: No Typology Code available for payment source | Admitting: Physical Therapy

## 2018-06-13 DIAGNOSIS — M542 Cervicalgia: Secondary | ICD-10-CM

## 2018-06-13 DIAGNOSIS — R293 Abnormal posture: Secondary | ICD-10-CM

## 2018-06-13 NOTE — Therapy (Signed)
[facility] [address] [place], [place], [address] Phone: [phone]   Fax:  [phone]  Physical Therapy Treatment  Patient Details  Name: Shawn Raymond MRN: [id number] Date of Birth: May 03, 1980 Referring Provider (PT): [facility] PA-C.   Encounter Date: 06/13/2018  PT End of Session - 06/13/18 1508    Visit Number  7    Number of Visits  16    Date for PT Re-Evaluation  07/09/18    PT Start Time  0145    PT Stop Time  0239    PT Time Calculation (min)  54 min    Activity Tolerance  Patient tolerated treatment well    Behavior During Therapy  [facility] for tasks assessed/performed       Past Medical History:  Diagnosis Date  . Asthma   . Sarcoidosis     Past Surgical History:  Procedure Laterality Date  . ANTERIOR CERVICAL DECOMP/DISCECTOMY FUSION N/A 04/02/2018   Procedure: ANTERIOR CERVICAL DECOMPRESSION/DISCECTOMY FUSION CERVICAL SIX- CERVICAL SEVEN ;  Surgeon: Lisbeth Renshaw, MD;  Location: [facility] OR;  Service: Neurosurgery;  Laterality: N/A;  . FEMUR FRACTURE SURGERY    . PATELLA RECONSTRUCTION    . POSTERIOR CERVICAL FUSION/FORAMINOTOMY N/A 04/02/2018   Procedure: CERVICAL THREE- CERVICAL FOUR, CERVICAL FOUR- CERVICAL FIVE, CERVICAL FIVE- CERVICAL SIX, CERVICAL SIX- CERVICAL SEVEN LAMINECTOMY WITH LATERAL MASS POSTERIOR SEGMENTAL INSTRUEMNTATION, POSTERIOR LATERAL ARTHRODESIS;  Surgeon: Lisbeth Renshaw, MD;  Location: [facility] OR;  Service: Neurosurgery;  Laterality: N/A;    There were no vitals filed for this visit.  Subjective Assessment - 06/13/18 1509    Subjective  I'm doing better.  Pain about a 5 today.    Patient Stated Goals  Want to get back to weight lifting.    Currently in Pain?  Yes    Pain Score  5     Pain Location  Neck    Pain Orientation  Right;Left    Pain Descriptors / Indicators  Tightness    Pain Type  Neuropathic pain    Pain Onset  More than a month ago         [facility] PT  Assessment - 06/13/18 0001      AROM   Cervical - Right Rotation  30    Cervical - Left Rotation  30                   [facility] Adult PT Treatment/Exercise - 06/13/18 0001      Modalities   Modalities  Electrical Stimulation;Moist Heat;Ultrasound      Moist Heat Therapy   Number Minutes Moist Heat  20 Minutes    Moist Heat Location  Cervical      Electrical Stimulation   Electrical Stimulation Location  Bilateral cervical.    Electrical Stimulation Action  IFC    Electrical Stimulation Parameters  80-150 Hz x 20 minutes on 110% scan    Electrical Stimulation Goals  Tone;Pain      Ultrasound   Ultrasound Location  Bilateral cervical paraspinal musculature.    Ultrasound Parameters  1.50 W/CM2 x 12 minutes 3.3 mHz at 50%.    Ultrasound Goals  Pain      Manual Therapy   Manual Therapy  Soft tissue mobilization    Soft tissue mobilization  STW/M x 12 minutes to bilateral cervical paraspinal musculature x 12 minutes.                  PT Long Term Goals - 05/30/18  1154      PT LONG TERM GOAL #1   Title  Independent with a HEP.    Time  8    Period  Weeks    Status  Achieved      PT LONG TERM GOAL #2   Title  Eliminate bilateral UE symptoms.    Time  8    Period  Weeks    Status  On-going      PT LONG TERM GOAL #3   Title  Perform ADL's with pain not > 3/10.    Time  8    Period  Weeks    Status  On-going      PT LONG TERM GOAL #4   Title  Increase active cervical rotation to 55-60 degrees+ so patient can turn head more easily while driving.    Time  8    Period  Weeks    Status  On-going            Plan - 06/13/18 1520    Clinical Impression Statement  Patient doing very well.  Some "grinding" continues which patient states he has had since having surgery.  Pain-level decreasing and active bilateral cervical range of motion has improved to 30 degrees.    PT Treatment/Interventions  ADLs/Self Care Home Management;Cryotherapy;Electrical  Stimulation;Moist Heat;Ultrasound;Therapeutic exercise;Therapeutic activities;Patient/family education;Manual techniques    PT Next Visit Plan  cont per cervical fusion protocol, modalites as needed and gentle STW/M.    Consulted and Agree with Plan of Care  Patient       Patient will benefit from skilled therapeutic intervention in order to improve the following deficits and impairments:  Pain, Decreased activity tolerance, Increased muscle spasms, Postural dysfunction  Visit Diagnosis: Cervicalgia  Abnormal posture     Problem List Patient Active Problem List   Diagnosis Date Noted  . Stenosis of cervical spine with myelopathy (HCC) 04/02/2018  . Cervical disc disorder with radiculopathy of cervical region 01/25/2018  . Spinal stenosis in cervical region 01/25/2018  . Cervicalgia 01/25/2018  . Heartburn 01/25/2018  . FRACTURE, ANKLE, RIGHT 08/09/2009  . ANKLE SPRAIN, RIGHT 08/09/2009  . COCAINE ABUSE 08/03/2009  . TOBACCO ABUSE 08/02/2009  . 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 INTRINSIC ASTHMA, UNSPECIFIED 03/23/2009  . Nonspecific (abnormal) findings on radiological and other examination of body structure 01/07/2009  . CT, CHEST, ABNORMAL 01/07/2009    Dorthea Maina, [place] MPT 06/13/2018, 3:22 PM  [facility] [address] [place], [place], [address] Phone: [phone]   Fax:  [phone]  Name: Shawn Raymond MRN: [id number] Date of Birth: [birth year]

## 2018-06-13 NOTE — Telephone Encounter (Signed)
Please refer pt to per his request [facility]  [address] [place]. Dr. Barbette Merino

## 2018-06-18 ENCOUNTER — Encounter: Payer: Self-pay | Admitting: Physical Therapy

## 2018-06-18 ENCOUNTER — Ambulatory Visit: Payer: No Typology Code available for payment source | Attending: Physician Assistant | Admitting: Physical Therapy

## 2018-06-18 DIAGNOSIS — R293 Abnormal posture: Secondary | ICD-10-CM

## 2018-06-18 DIAGNOSIS — M542 Cervicalgia: Secondary | ICD-10-CM | POA: Insufficient documentation

## 2018-06-18 NOTE — Therapy (Signed)
[facility] [address] [place], [place], [address] Phone: [phone]   Fax:  [phone]  Physical Therapy Treatment  Patient Details  Name: Shawn Raymond MRN: [id number] Date of Birth: 1980/06/21 Referring Provider (PT): [facility] PA-C.   Encounter Date: 06/18/2018  PT End of Session - 06/18/18 1227    Visit Number  8    Number of Visits  16    Date for PT Re-Evaluation  07/09/18    PT Start Time  1115    PT Stop Time  1211    PT Time Calculation (min)  56 min    Activity Tolerance  Patient tolerated treatment well    Behavior During Therapy  [facility] for tasks assessed/performed       Past Medical History:  Diagnosis Date  . Asthma   . Sarcoidosis     Past Surgical History:  Procedure Laterality Date  . ANTERIOR CERVICAL DECOMP/DISCECTOMY FUSION N/A 04/02/2018   Procedure: ANTERIOR CERVICAL DECOMPRESSION/DISCECTOMY FUSION CERVICAL SIX- CERVICAL SEVEN ;  Surgeon: Lisbeth Renshaw, MD;  Location: [facility] OR;  Service: Neurosurgery;  Laterality: N/A;  . FEMUR FRACTURE SURGERY    . PATELLA RECONSTRUCTION    . POSTERIOR CERVICAL FUSION/FORAMINOTOMY N/A 04/02/2018   Procedure: CERVICAL THREE- CERVICAL FOUR, CERVICAL FOUR- CERVICAL FIVE, CERVICAL FIVE- CERVICAL SIX, CERVICAL SIX- CERVICAL SEVEN LAMINECTOMY WITH LATERAL MASS POSTERIOR SEGMENTAL INSTRUEMNTATION, POSTERIOR LATERAL ARTHRODESIS;  Surgeon: Lisbeth Renshaw, MD;  Location: [facility] OR;  Service: Neurosurgery;  Laterality: N/A;    There were no vitals filed for this visit.  Subjective Assessment - 06/18/18 1208    Subjective  Doing okay.    Patient Stated Goals  Want to get back to weight lifting.    Currently in Pain?  Yes    Pain Score  6     Pain Location  Neck    Pain Orientation  Right;Left    Pain Descriptors / Indicators  Tightness    Pain Type  Neuropathic pain    Pain Onset  More than a month ago         [facility] PT Assessment - 06/18/18 0001      AROM   Cervical - Right Rotation  30    Cervical - Left Rotation  36                   [facility] Adult PT Treatment/Exercise - 06/18/18 0001      Shoulder Exercises: Standing   Other Standing Exercises  Green XTS exercises all performed to fatigue:  Bilateral shoulder extension with elbows in full extension, scapular retraction with elbows at ~45 degrees of bilateral abduction and forward punches.    Other Standing Exercises  Bilateral standing elbow flexion (bicep curls) with 5# to fatigue.      Shoulder Exercises: Pulleys   Flexion  5 minutes      Modalities   Modalities  Electrical Stimulation;Moist Heat      Moist Heat Therapy   Number Minutes Moist Heat  20 Minutes    Moist Heat Location  Cervical      Electrical Stimulation   Electrical Stimulation Location  Bilateral cervical region.    Electrical Stimulation Action  Pre-mod.    Electrical Stimulation Parameters  80-150 Hz (5 sec on and 5 sec off) x 20 minutes.    Electrical Stimulation Goals  Tone;Pain      Manual Therapy   Manual Therapy  Soft tissue mobilization    Soft tissue mobilization  STW/M  to bilateral UT's and cervical paraspinals x 13 minutes.                  PT Long Term Goals - 05/30/18 1154      PT LONG TERM GOAL #1   Title  Independent with a HEP.    Time  8    Period  Weeks    Status  Achieved      PT LONG TERM GOAL #2   Title  Eliminate bilateral UE symptoms.    Time  8    Period  Weeks    Status  On-going      PT LONG TERM GOAL #3   Title  Perform ADL's with pain not > 3/10.    Time  8    Period  Weeks    Status  On-going      PT LONG TERM GOAL #4   Title  Increase active cervical rotation to 55-60 degrees+ so patient can turn head more easily while driving.    Time  8    Period  Weeks    Status  On-going            Plan - 06/18/18 1224    Clinical Impression Statement  The patient did great today with progression per cervical fusion protocol.  His left  active cervical rotation improved to 36 degrees.  We also reviewed chin tucks which he performed with excellent technique.  He was provided with red theraband for his HEP.    PT Next Visit Plan  cont per cervical fusion protocol, modalites as needed and gentle STW/M.    Consulted and Agree with Plan of Care  Patient       Patient will benefit from skilled therapeutic intervention in order to improve the following deficits and impairments:  Pain, Decreased activity tolerance, Increased muscle spasms, Postural dysfunction  Visit Diagnosis: Cervicalgia  Abnormal posture     Problem List Patient Active Problem List   Diagnosis Date Noted  . Stenosis of cervical spine with myelopathy (HCC) 04/02/2018  . Cervical disc disorder with radiculopathy of cervical region 01/25/2018  . Spinal stenosis in cervical region 01/25/2018  . Cervicalgia 01/25/2018  . Heartburn 01/25/2018  . FRACTURE, ANKLE, RIGHT 08/09/2009  . ANKLE SPRAIN, RIGHT 08/09/2009  . COCAINE ABUSE 08/03/2009  . TOBACCO ABUSE 08/02/2009  . 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 INTRINSIC ASTHMA, UNSPECIFIED 03/23/2009  . Nonspecific (abnormal) findings on radiological and other examination of body structure 01/07/2009  . CT, CHEST, ABNORMAL 01/07/2009    Shawn Raymond, [place] MPT 06/18/2018, 12:28 PM  [facility] [address] [place], [place], [address] Phone: [phone]   Fax:  [phone]  Name: Shawn Raymond MRN: [id number] Date of Birth: 04-Oct-1980

## 2018-06-19 ENCOUNTER — Other Ambulatory Visit: Payer: Self-pay | Admitting: Pharmacist

## 2018-06-19 DIAGNOSIS — R12 Heartburn: Secondary | ICD-10-CM

## 2018-06-19 MED ORDER — OMEPRAZOLE 40 MG PO CPDR: 40.0000 mg | DELAYED_RELEASE_CAPSULE | Freq: Every day | ORAL | 2 refills | Status: DC

## 2018-06-19 MED FILL — OMEPRAZOLE DR 40 MG CAPSULE: 40 | 30 days supply | Qty: 30 | Fill #0

## 2018-06-20 MED FILL — $Symbicort 160-4.5mcg/act: 160-4.5 | 89 days supply | Qty: 3 | Fill #7

## 2018-06-20 MED FILL — SUMATRIPTAN SUCC 50 MG TAB: 50 | 30 days supply | Qty: 9 | Fill #0

## 2018-06-21 ENCOUNTER — Ambulatory Visit: Payer: No Typology Code available for payment source | Admitting: Physical Therapy

## 2018-06-21 DIAGNOSIS — M542 Cervicalgia: Secondary | ICD-10-CM

## 2018-06-21 DIAGNOSIS — R293 Abnormal posture: Secondary | ICD-10-CM

## 2018-06-21 NOTE — Therapy (Signed)
[facility] [address] [place], [place], [address] Phone: [phone]   Fax:  [phone]  Physical Therapy Treatment  Patient Details  Name: Shawn Raymond MRN: [id number] Date of Birth: 05/28/1980 Referring Provider (PT): [facility] PA-C.   Encounter Date: 06/21/2018  PT End of Session - 06/21/18 1323    Visit Number  9    Number of Visits  16    Date for PT Re-Evaluation  07/09/18    PT Start Time  1117    PT Stop Time  1211    PT Time Calculation (min)  54 min    Activity Tolerance  Patient tolerated treatment well    Behavior During Therapy  [facility] for tasks assessed/performed       Past Medical History:  Diagnosis Date  . Asthma   . Sarcoidosis     Past Surgical History:  Procedure Laterality Date  . ANTERIOR CERVICAL DECOMP/DISCECTOMY FUSION N/A 04/02/2018   Procedure: ANTERIOR CERVICAL DECOMPRESSION/DISCECTOMY FUSION CERVICAL SIX- CERVICAL SEVEN ;  Surgeon: Lisbeth Renshaw, MD;  Location: [facility] OR;  Service: Neurosurgery;  Laterality: N/A;  . FEMUR FRACTURE SURGERY    . PATELLA RECONSTRUCTION    . POSTERIOR CERVICAL FUSION/FORAMINOTOMY N/A 04/02/2018   Procedure: CERVICAL THREE- CERVICAL FOUR, CERVICAL FOUR- CERVICAL FIVE, CERVICAL FIVE- CERVICAL SIX, CERVICAL SIX- CERVICAL SEVEN LAMINECTOMY WITH LATERAL MASS POSTERIOR SEGMENTAL INSTRUEMNTATION, POSTERIOR LATERAL ARTHRODESIS;  Surgeon: Lisbeth Renshaw, MD;  Location: [facility] OR;  Service: Neurosurgery;  Laterality: N/A;    There were no vitals filed for this visit.  Subjective Assessment - 06/21/18 1314    Subjective  Doing good.    Patient is accompained by:  Family member    Patient Stated Goals  Want to get back to weight lifting.    Currently in Pain?  Yes    Pain Score  5     Pain Location  Neck    Pain Orientation  Right;Left    Pain Descriptors / Indicators  Tightness    Pain Type  Neuropathic pain    Pain Onset  More than a month ago                        [facility] Adult PT Treatment/Exercise - 06/21/18 0001      Exercises   Exercises  Shoulder      Shoulder Exercises: Seated   Other Seated Exercises  Grren XTS band seated lat pulldowns to fatigue.      Shoulder Exercises: Standing   Other Standing Exercises  Green XTS bilateral shoulder extension and scap retraction with elbows at ~45 degrees abduction f/b Tricep pushdowns.  Patient then performed wall push-ups and 5# dumbbell curls.Marland Kitchenall exercises performed to fatigue.      Shoulder Exercises: Pulleys   Flexion  --   4 minutes.     Modalities   Modalities  Electrical Stimulation;Moist Heat      Moist Heat Therapy   Number Minutes Moist Heat  20 Minutes    Moist Heat Location  Cervical      Electrical Stimulation   Electrical Stimulation Location  Bilateral cervical.    Electrical Stimulation Action  IFC    Electrical Stimulation Parameters  80-150 Hz x at 100% scan x 20 minutes.    Electrical Stimulation Goals  Tone;Pain      Manual Therapy   Manual Therapy  Soft tissue mobilization    Soft tissue mobilization  STW/M x 10 minutes to bilateral cervical  paraspinal musculature to reduce tone.                  PT Long Term Goals - 05/30/18 1154      PT LONG TERM GOAL #1   Title  Independent with a HEP.    Time  8    Period  Weeks    Status  Achieved      PT LONG TERM GOAL #2   Title  Eliminate bilateral UE symptoms.    Time  8    Period  Weeks    Status  On-going      PT LONG TERM GOAL #3   Title  Perform ADL's with pain not > 3/10.    Time  8    Period  Weeks    Status  On-going      PT LONG TERM GOAL #4   Title  Increase active cervical rotation to 55-60 degrees+ so patient can turn head more easily while driving.    Time  8    Period  Weeks    Status  On-going            Plan - 06/21/18 1322    Clinical Impression Statement  Patient id doing very well progressing very nicely per cervical fusion protocol.   He performed additional exercises with complaint or difficulty today.    PT Treatment/Interventions  ADLs/Self Care Home Management;Cryotherapy;Electrical Stimulation;Moist Heat;Ultrasound;Therapeutic exercise;Therapeutic activities;Patient/family education;Manual techniques    PT Next Visit Plan  cont per cervical fusion protocol, modalites as needed and gentle STW/M.    Consulted and Agree with Plan of Care  Patient       Patient will benefit from skilled therapeutic intervention in order to improve the following deficits and impairments:  Pain, Decreased activity tolerance, Increased muscle spasms, Postural dysfunction  Visit Diagnosis: Cervicalgia  Abnormal posture     Problem List Patient Active Problem List   Diagnosis Date Noted  . Stenosis of cervical spine with myelopathy (HCC) 04/02/2018  . Cervical disc disorder with radiculopathy of cervical region 01/25/2018  . Spinal stenosis in cervical region 01/25/2018  . Cervicalgia 01/25/2018  . Heartburn 01/25/2018  . FRACTURE, ANKLE, RIGHT 08/09/2009  . ANKLE SPRAIN, RIGHT 08/09/2009  . COCAINE ABUSE 08/03/2009  . TOBACCO ABUSE 08/02/2009  . 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 INTRINSIC ASTHMA, UNSPECIFIED 03/23/2009  . Nonspecific (abnormal) findings on radiological and other examination of body structure 01/07/2009  . CT, CHEST, ABNORMAL 01/07/2009    Adelina Collard, [place] MPT 06/21/2018, 1:25 PM  [facility] [address] [place], [place], [address] Phone: [phone]   Fax:  [phone]  Name: KENDYN PALER MRN: [id number] Date of Birth: 1981/04/15

## 2018-06-25 ENCOUNTER — Other Ambulatory Visit: Payer: Self-pay | Admitting: Pharmacist

## 2018-06-25 ENCOUNTER — Ambulatory Visit: Payer: No Typology Code available for payment source | Admitting: Physical Therapy

## 2018-06-25 DIAGNOSIS — M542 Cervicalgia: Secondary | ICD-10-CM

## 2018-06-25 DIAGNOSIS — J45909 Unspecified asthma, uncomplicated: Secondary | ICD-10-CM

## 2018-06-25 DIAGNOSIS — R293 Abnormal posture: Secondary | ICD-10-CM

## 2018-06-25 MED ORDER — ALBUTEROL SULFATE HFA 108 (90 BASE) MCG/ACT IN AERS: INHALATION_SPRAY | RESPIRATORY_TRACT | 2 refills | Status: DC

## 2018-06-25 NOTE — Therapy (Signed)
[facility] [address] [place], [place], [address] Phone: [phone]   Fax:  [phone]  Physical Therapy Treatment  Patient Details  Name: Shawn Raymond MRN: [id number] Date of Birth: 17-Aug-1980 Referring Provider (PT): [facility] PA-C.   Encounter Date: 06/25/2018  PT End of Session - 06/25/18 1630    Visit Number  10    Number of Visits  16    Date for PT Re-Evaluation  07/09/18    PT Start Time  1134   Late for treatment.   PT Stop Time  1209    PT Time Calculation (min)  35 min       Past Medical History:  Diagnosis Date  . Asthma   . Sarcoidosis     Past Surgical History:  Procedure Laterality Date  . ANTERIOR CERVICAL DECOMP/DISCECTOMY FUSION N/A 04/02/2018   Procedure: ANTERIOR CERVICAL DECOMPRESSION/DISCECTOMY FUSION CERVICAL SIX- CERVICAL SEVEN ;  Surgeon: Lisbeth Renshaw, MD;  [facility];  Service: Neurosurgery;  Laterality: N/A;  . FEMUR FRACTURE SURGERY    . PATELLA RECONSTRUCTION    . POSTERIOR CERVICAL FUSION/FORAMINOTOMY N/A 04/02/2018   Procedure: CERVICAL THREE- CERVICAL FOUR, CERVICAL FOUR- CERVICAL FIVE, CERVICAL FIVE- CERVICAL SIX, CERVICAL SIX- CERVICAL SEVEN LAMINECTOMY WITH LATERAL MASS POSTERIOR SEGMENTAL INSTRUEMNTATION, POSTERIOR LATERAL ARTHRODESIS;  Surgeon: Lisbeth Renshaw, MD;  Location: [facility] OR;  Service: Neurosurgery;  Laterality: N/A;    There were no vitals filed for this visit.  Subjective Assessment - 06/25/18 1631    Subjective  I felt good this weekend but I overdid my home exercise (twice a day) so my pain is up today.      Currently in Pain?  Yes    Pain Score  7     Pain Location  Neck    Pain Orientation  Right;Left    Pain Descriptors / Indicators  Tightness    Pain Type  Neuropathic pain    Pain Onset  More than a month ago                       [facility] Adult PT Treatment/Exercise - 06/25/18 0001      Modalities   Modalities   Electrical Stimulation;Moist Heat      Moist Heat Therapy   Number Minutes Moist Heat  15 Minutes    Moist Heat Location  Cervical      Electrical Stimulation   Electrical Stimulation Location  Bilateral cervical.    Electrical Stimulation Action  Pre-mod to each side.    Electrical Stimulation Parameters  80-150 Hz x 15 minutes (5 sec on and 5 sec off).    Electrical Stimulation Goals  Tone;Pain      Manual Therapy   Manual Therapy  Soft tissue mobilization    Soft tissue mobilization  STW/M x 15 minutes to bilateral cervical paraspinal and UT musculature to decrease tone.                  PT Long Term Goals - 06/25/18 1631      PT LONG TERM GOAL #1   Title  Independent with a HEP.    Time  8            Plan - 06/25/18 1635    Clinical Impression Statement  Patient overdoing hoem exercises.  Recommeded that he back to to once a day every other day.  He felt better after treatment today.    PT Treatment/Interventions  ADLs/Self Care  Home Management;Cryotherapy;Electrical Stimulation;Moist Heat;Ultrasound;Therapeutic exercise;Therapeutic activities;Patient/family education;Manual techniques    PT Next Visit Plan  cont per cervical fusion protocol, modalites as needed and gentle STW/M.    Consulted and Agree with Plan of Care  Patient       Patient will benefit from skilled therapeutic intervention in order to improve the following deficits and impairments:  Pain, Decreased activity tolerance, Increased muscle spasms, Postural dysfunction  Visit Diagnosis: Cervicalgia  Abnormal posture     Problem List Patient Active Problem List   Diagnosis Date Noted  . Stenosis of cervical spine with myelopathy (HCC) 04/02/2018  . Cervical disc disorder with radiculopathy of cervical region 01/25/2018  . Spinal stenosis in cervical region 01/25/2018  . Cervicalgia 01/25/2018  . Heartburn 01/25/2018  . FRACTURE, ANKLE, RIGHT 08/09/2009  . ANKLE SPRAIN, RIGHT  08/09/2009  . COCAINE ABUSE 08/03/2009  . TOBACCO ABUSE 08/02/2009  . 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 INTRINSIC ASTHMA, UNSPECIFIED 03/23/2009  . Nonspecific (abnormal) findings on radiological and other examination of body structure 01/07/2009  . CT, CHEST, ABNORMAL 01/07/2009    Progress Note Reporting Period 05/14/18 to 06/25/18  See note below for Objective Data and Assessment of Progress/Goals. Patient is progressing toward goals.  His cervical AROM is improving nicely.     Baker Moronta, [place] MPT 06/25/2018, 4:45 PM  [facility] [address] [place], [place], [address] Phone: [phone]   Fax:  [phone]  Name: Shawn Raymond MRN: [id number] Date of Birth: 07-Apr-1981

## 2018-06-27 ENCOUNTER — Ambulatory Visit: Payer: No Typology Code available for payment source | Admitting: Physical Therapy

## 2018-06-27 ENCOUNTER — Other Ambulatory Visit: Payer: Self-pay

## 2018-06-27 DIAGNOSIS — M542 Cervicalgia: Secondary | ICD-10-CM

## 2018-06-27 DIAGNOSIS — R293 Abnormal posture: Secondary | ICD-10-CM

## 2018-06-27 NOTE — Therapy (Signed)
[facility] [address] [place], [place], [address] Phone: [phone]   Fax:  [phone]  Physical Therapy Treatment  Patient Details  Name: Shawn Raymond MRN: [id number] Date of Birth: May 01, 1980 Referring Provider (PT): [facility] PA-C.   Encounter Date: 06/27/2018  PT End of Session - 06/27/18 1118    Visit Number  11    Number of Visits  16    Date for PT Re-Evaluation  07/09/18    PT Start Time  1115    PT Stop Time  1212    PT Time Calculation (min)  57 min    Activity Tolerance  Patient tolerated treatment well    Behavior During Therapy  [facility] for tasks assessed/performed       Past Medical History:  Diagnosis Date  . Asthma   . Sarcoidosis     Past Surgical History:  Procedure Laterality Date  . ANTERIOR CERVICAL DECOMP/DISCECTOMY FUSION N/A 04/02/2018   Procedure: ANTERIOR CERVICAL DECOMPRESSION/DISCECTOMY FUSION CERVICAL SIX- CERVICAL SEVEN ;  Surgeon: Lisbeth Renshaw, MD;  Location: [facility] OR;  Service: Neurosurgery;  Laterality: N/A;  . FEMUR FRACTURE SURGERY    . PATELLA RECONSTRUCTION    . POSTERIOR CERVICAL FUSION/FORAMINOTOMY N/A 04/02/2018   Procedure: CERVICAL THREE- CERVICAL FOUR, CERVICAL FOUR- CERVICAL FIVE, CERVICAL FIVE- CERVICAL SIX, CERVICAL SIX- CERVICAL SEVEN LAMINECTOMY WITH LATERAL MASS POSTERIOR SEGMENTAL INSTRUEMNTATION, POSTERIOR LATERAL ARTHRODESIS;  Surgeon: Lisbeth Renshaw, MD;  [facility]: [facility] OR;  Service: Neurosurgery;  Laterality: N/A;    There were no vitals filed for this visit.  Subjective Assessment - 06/27/18 1117    Subjective  Reports he feels like he is doing alright.    Patient is accompained by:  Family member    Patient Stated Goals  Want to get back to weight lifting.    Currently in Pain?  Yes    Pain Score  7     Pain Location  Neck    Pain Orientation  Right;Left    Pain Descriptors / Indicators  Discomfort    Pain Type  Surgical pain    Pain Onset  More  than a month ago    Pain Frequency  Constant         [facility] PT Assessment - 06/27/18 0001      Assessment   Medical Diagnosis  Spinal stenosis, cervical region.    Referring Provider (PT)  Cindra Presume PA-C.    Onset Date/Surgical Date  04/02/18    Next MD Visit  07/31/2018      Precautions   Precautions  Cervical    Precaution Comments  Follow Cervical fusion protocol       Restrictions   Weight Bearing Restrictions  No                   [facility] Adult PT Treatment/Exercise - 06/27/18 0001      Neck Exercises: Machines for Strengthening   UBE (Upper Arm Bike)  120 RPM x8 min in standing and sitting    Nustep  L3 x8 min      Shoulder Exercises: Standing   Flexion  AROM;Both;20 reps    Extension  Strengthening;Both;20 reps;Limitations    Extension Limitations  Green XTS    Row  Strengthening;Both;20 reps;Limitations    Row Limitations  Green XTS    Other Standing Exercises  B shoulder scaption AROM x20 reps      Shoulder Exercises: ROM/Strengthening   Wall Pushups  20 reps   normal stance,  diamond and wide stance each     Modalities   Modalities  Electrical Stimulation;Moist Heat      Moist Heat Therapy   Number Minutes Moist Heat  15 Minutes    Moist Heat Location  Cervical      Electrical Stimulation   Electrical Stimulation Location  B cervical, UT    Electrical Stimulation Action  IFC    Electrical Stimulation Parameters  80-150 hz x15 min    Electrical Stimulation Goals  Pain                  PT Long Term Goals - 06/25/18 1631      PT LONG TERM GOAL #1   Title  Independent with a HEP.    Time  8            Plan - 06/27/18 1209    Clinical Impression Statement  Patient presented in clinic and eager to progress to his former activity level. New therex initated today with proper instruction for standing posture. Good overall posture and technique noted throughout therex. No reports of any increased pain during therex session.  Normal modalities response noted following removal of the modalities.    Rehab Potential  Excellent    PT Frequency  2x / week    PT Duration  8 weeks    PT Treatment/Interventions  ADLs/Self Care Home Management;Cryotherapy;Electrical Stimulation;Moist Heat;Ultrasound;Therapeutic exercise;Therapeutic activities;Patient/family education;Manual techniques    PT Next Visit Plan  cont per cervical fusion protocol, modalites as needed and gentle STW/M.    Consulted and Agree with Plan of Care  Patient       Patient will benefit from skilled therapeutic intervention in order to improve the following deficits and impairments:  Pain, Decreased activity tolerance, Increased muscle spasms, Postural dysfunction  Visit Diagnosis: Cervicalgia  Abnormal posture     Problem List Patient Active Problem List   Diagnosis Date Noted  . Stenosis of cervical spine with myelopathy (HCC) 04/02/2018  . Cervical disc disorder with radiculopathy of cervical region 01/25/2018  . Spinal stenosis in cervical region 01/25/2018  . Cervicalgia 01/25/2018  . Heartburn 01/25/2018  . FRACTURE, ANKLE, RIGHT 08/09/2009  . ANKLE SPRAIN, RIGHT 08/09/2009  . COCAINE ABUSE 08/03/2009  . TOBACCO ABUSE 08/02/2009  . 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 INTRINSIC ASTHMA, UNSPECIFIED 03/23/2009  . Nonspecific (abnormal) findings on radiological and other examination of body structure 01/07/2009  . CT, CHEST, ABNORMAL 01/07/2009    [name], PTA 06/27/2018, 12:15 PM  [facility] [address] [place], [place], [address] Phone: [phone]   Fax:  [phone]  Name: Shawn Raymond MRN: [id number] Date of Birth: Nov 12, 1980

## 2018-06-28 ENCOUNTER — Other Ambulatory Visit (INDEPENDENT_AMBULATORY_CARE_PROVIDER_SITE_OTHER): Payer: Self-pay | Admitting: Family Medicine

## 2018-06-28 DIAGNOSIS — M5412 Radiculopathy, cervical region: Secondary | ICD-10-CM

## 2018-06-28 MED FILL — OMEPRAZOLE DR 40 MG CAPSULE: 40 | 60 days supply | Qty: 60 | Fill #1

## 2018-07-01 MED FILL — METHOCARBAMOL 500 MG TABS: 500 | 20 days supply | Qty: 60 | Fill #0

## 2018-07-02 ENCOUNTER — Ambulatory Visit: Payer: No Typology Code available for payment source | Admitting: Physical Therapy

## 2018-07-02 ENCOUNTER — Other Ambulatory Visit: Payer: Self-pay

## 2018-07-02 DIAGNOSIS — M542 Cervicalgia: Secondary | ICD-10-CM

## 2018-07-02 DIAGNOSIS — R293 Abnormal posture: Secondary | ICD-10-CM

## 2018-07-02 NOTE — Therapy (Signed)
[facility] [address] [place], [place], [address] Phone: [phone]   Fax:  [phone]  Physical Therapy Treatment  Patient Details  Name: Shawn Raymond MRN: [id number] Date of Birth: 04/08/1981 Referring Provider (PT): [facility] PA-C.   Encounter Date: 07/02/2018  PT End of Session - 07/02/18 1122    Visit Number  12    Number of Visits  16    Date for PT Re-Evaluation  07/09/18    PT Start Time  1121    PT Stop Time  1213    PT Time Calculation (min)  52 min    Activity Tolerance  Patient tolerated treatment well    Behavior During Therapy  [facility] for tasks assessed/performed       Past Medical History:  Diagnosis Date  . Asthma   . Sarcoidosis     Past Surgical History:  Procedure Laterality Date  . ANTERIOR CERVICAL DECOMP/DISCECTOMY FUSION N/A 04/02/2018   Procedure: ANTERIOR CERVICAL DECOMPRESSION/DISCECTOMY FUSION CERVICAL SIX- CERVICAL SEVEN ;  Surgeon: Lisbeth Renshaw, MD;  [facility];  Service: Neurosurgery;  Laterality: N/A;  . FEMUR FRACTURE SURGERY    . PATELLA RECONSTRUCTION    . POSTERIOR CERVICAL FUSION/FORAMINOTOMY N/A 04/02/2018   Procedure: CERVICAL THREE- CERVICAL FOUR, CERVICAL FOUR- CERVICAL FIVE, CERVICAL FIVE- CERVICAL SIX, CERVICAL SIX- CERVICAL SEVEN LAMINECTOMY WITH LATERAL MASS POSTERIOR SEGMENTAL INSTRUEMNTATION, POSTERIOR LATERAL ARTHRODESIS;  Surgeon: Lisbeth Renshaw, MD;  Location: [facility] OR;  Service: Neurosurgery;  Laterality: N/A;    There were no vitals filed for this visit.  Subjective Assessment - 07/02/18 1122    Subjective  Reports hurting "a little bit."    Patient Stated Goals  Want to get back to weight lifting.    Currently in Pain?  Yes    Pain Score  6     Pain Location  Neck    Pain Orientation  Right;Left    Pain Descriptors / Indicators  Discomfort    Pain Type  Surgical pain    Pain Onset  More than a month ago         [facility] PT Assessment -  07/02/18 0001      Assessment   Medical Diagnosis  Spinal stenosis, cervical region.    Referring Provider (PT)  Cindra Presume PA-C.    Onset Date/Surgical Date  04/02/18    Next MD Visit  07/31/2018      Precautions   Precautions  Cervical    Precaution Comments  Follow Cervical fusion protocol       Restrictions   Weight Bearing Restrictions  No      ROM / Strength   AROM / PROM / Strength  AROM      AROM   Overall AROM   Deficits    AROM Assessment Site  Cervical    Cervical - Right Rotation  46    Cervical - Left Rotation  32                   [facility] Adult PT Treatment/Exercise - 07/02/18 0001      Neck Exercises: Machines for Strengthening   UBE (Upper Arm Bike)  60 RPM x8 min   in standing   Nustep  L5 x10 min      Shoulder Exercises: Standing   Extension  Strengthening;Both;20 reps;Limitations    Extension Limitations  Pink XTS    Row  Strengthening;Both;20 reps;Limitations    Row Limitations  Pink XTS    Diagonals  Strengthening;Both;20 reps;Theraband    Theraband Level (Shoulder Diagonals)  Level 1 (Yellow)    Other Standing Exercises  Snow angels x20 reps      Shoulder Exercises: ROM/Strengthening   Wall Pushups  20 reps      Modalities   Modalities  Electrical Stimulation;Moist Heat      Moist Heat Therapy   Number Minutes Moist Heat  15 Minutes    Moist Heat Location  Cervical      Electrical Stimulation   Electrical Stimulation Location  B cervical, UT    Electrical Stimulation Action  IFC    Electrical Stimulation Parameters  80-150 hz x15 min    Electrical Stimulation Goals  Pain             PT Education - 07/02/18 1207    Education Details  HEP- resisted horizontal abduction, D2; green theraband provided for previous HEP use only and red theraband for new HEP; instructed to follow parameters strictly and with any pain to stop.    Person(s) Educated  Patient    Methods  Explanation;Handout    Comprehension  Verbalized  understanding          PT Long Term Goals - 06/25/18 1631      PT LONG TERM GOAL #1   Title  Independent with a HEP.    Time  8            Plan - 07/02/18 1216    Clinical Impression Statement  Patient presented in clinic and progressed in regards to exercises and resistance. Patient able to tolerate all resistance levels well and no complaints of any increased pain. Patient provided new HEP with green theraband. Green theraband instructed as for previous HEP and red theraband is to go with new HEP. Patient instructed thoroughly regarding technique and parameters and instructed only twice daily for HEP. Patient also instructed if any pain presents to stop. Normal modalities response noted following removal of the modalities.     Rehab Potential  Excellent    PT Frequency  2x / week    PT Duration  8 weeks    PT Treatment/Interventions  ADLs/Self Care Home Management;Cryotherapy;Electrical Stimulation;Moist Heat;Ultrasound;Therapeutic exercise;Therapeutic activities;Patient/family education;Manual techniques    PT Next Visit Plan  cont per cervical fusion protocol, modalites as needed and gentle STW/M.    PT Home Exercise Plan  see patient education section    Consulted and Agree with Plan of Care  Patient       Patient will benefit from skilled therapeutic intervention in order to improve the following deficits and impairments:  Pain, Decreased activity tolerance, Increased muscle spasms, Postural dysfunction  Visit Diagnosis: Cervicalgia  Abnormal posture     Problem List Patient Active Problem List   Diagnosis Date Noted  . Stenosis of cervical spine with myelopathy (HCC) 04/02/2018  . Cervical disc disorder with radiculopathy of cervical region 01/25/2018  . Spinal stenosis in cervical region 01/25/2018  . Cervicalgia 01/25/2018  . Heartburn 01/25/2018  . FRACTURE, ANKLE, RIGHT 08/09/2009  . ANKLE SPRAIN, RIGHT 08/09/2009  . COCAINE ABUSE 08/03/2009  . TOBACCO  ABUSE 08/02/2009  . 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 INTRINSIC ASTHMA, UNSPECIFIED 03/23/2009  . Nonspecific (abnormal) findings on radiological and other examination of body structure 01/07/2009  . CT, CHEST, ABNORMAL 01/07/2009    [name], PTA 07/02/2018, 12:23 PM  [facility] [address] [place], [place], [address] Phone: [phone]   Fax:  [phone]  Name: Geomar  AZTLAN BILLIPS MRN: [id number] Date of Birth: [birth year]

## 2018-07-04 MED FILL — SUMATRIPTAN SUCC 50 MG TAB: 50 | 30 days supply | Qty: 9 | Fill #1

## 2018-07-04 MED FILL — $VENTOLIN HFA 18G INHALER: 108 (90 BAS | 75 days supply | Qty: 54 | Fill #0

## 2018-07-05 ENCOUNTER — Ambulatory Visit: Payer: No Typology Code available for payment source | Admitting: *Deleted

## 2018-07-17 ENCOUNTER — Encounter: Payer: Self-pay | Admitting: Physical Therapy

## 2018-07-17 ENCOUNTER — Other Ambulatory Visit: Payer: Self-pay

## 2018-07-17 ENCOUNTER — Ambulatory Visit: Payer: 59 | Attending: Physician Assistant | Admitting: Physical Therapy

## 2018-07-17 DIAGNOSIS — M542 Cervicalgia: Secondary | ICD-10-CM | POA: Insufficient documentation

## 2018-07-17 DIAGNOSIS — R293 Abnormal posture: Secondary | ICD-10-CM | POA: Insufficient documentation

## 2018-07-17 NOTE — Therapy (Signed)
[facility] [address] [place], [place], [address] Phone: [phone]   Fax:  [phone]  Physical Therapy Treatment  Patient Details  Name: Shawn Raymond MRN: [id number] Date of Birth: 06-16-1980 Referring Provider (PT): [facility] PA-C.   Encounter Date: 07/17/2018  PT End of Session - 07/17/18 1119    Visit Number  13    Number of Visits  16    Date for PT Re-Evaluation  07/09/18    PT Start Time  1110    PT Stop Time  1204    PT Time Calculation (min)  54 min    Activity Tolerance  Patient tolerated treatment well    Behavior During Therapy  WFL for tasks assessed/performed       Past Medical History:  Diagnosis Date  . Asthma   . Sarcoidosis     Past Surgical History:  Procedure Laterality Date  . ANTERIOR CERVICAL DECOMP/DISCECTOMY FUSION N/A 04/02/2018   Procedure: ANTERIOR CERVICAL DECOMPRESSION/DISCECTOMY FUSION CERVICAL SIX- CERVICAL SEVEN ;  Surgeon: Lisbeth Renshaw, MD;  Location: [facility] OR;  Service: Neurosurgery;  Laterality: N/A;  . FEMUR FRACTURE SURGERY    . PATELLA RECONSTRUCTION    . POSTERIOR CERVICAL FUSION/FORAMINOTOMY N/A 04/02/2018   Procedure: CERVICAL THREE- CERVICAL FOUR, CERVICAL FOUR- CERVICAL FIVE, CERVICAL FIVE- CERVICAL SIX, CERVICAL SIX- CERVICAL SEVEN LAMINECTOMY WITH LATERAL MASS POSTERIOR SEGMENTAL INSTRUEMNTATION, POSTERIOR LATERAL ARTHRODESIS;  Surgeon: Lisbeth Renshaw, MD;  Location: [facility] OR;  Service: Neurosurgery;  Laterality: N/A;    There were no vitals filed for this visit.  Subjective Assessment - 07/17/18 1111    Subjective  COVID-19 screening performed prior to patient entering the building. Patient reports feeling sore but doing alright.     Patient Stated Goals  Want to get back to weight lifting.    Currently in Pain?  Yes    Pain Score  8     Pain Location  Neck    Pain Orientation  Left;Right    Pain Descriptors / Indicators  Sore    Pain Type  Surgical  pain    Pain Onset  More than a month ago    Pain Frequency  Constant         [facility] PT Assessment - 07/17/18 0001      Assessment   Medical Diagnosis  Spinal stenosis, cervical region.    Referring Provider (PT)  Cindra Presume PA-C.    Onset Date/Surgical Date  04/02/18    Next MD Visit  07/31/2018      Precautions   Precautions  Cervical    Precaution Comments  Follow Cervical fusion protocol       Restrictions   Weight Bearing Restrictions  No                   [facility] Adult PT Treatment/Exercise - 07/17/18 0001      Neck Exercises: Machines for Strengthening   UBE (Upper Arm Bike)  60 RPM x8 min   standing   Nustep  L5 x10 min      Shoulder Exercises: Standing   Extension  Strengthening;Both;20 reps;Limitations    Extension Limitations  Pink XTS    Row  Strengthening;Both;20 reps;Limitations    Row Limitations  Pink XTS    Other Standing Exercises  woodchops x20 pink XTS      Shoulder Exercises: ROM/Strengthening   Wall Pushups  20 reps   1x20 diamond, 1x20 shoulder width, 1x20 wide   Other ROM/Strengthening Exercises  standing  scapular slides Y on wall x20    Other ROM/Strengthening Exercises  bilateral overhead lift with 2# medicine ball 2x10      Modalities   Modalities  Electrical Stimulation;Moist Heat      Moist Heat Therapy   Number Minutes Moist Heat  15 Minutes    Moist Heat Location  Cervical      Electrical Stimulation   Electrical Stimulation Location  B cervical, UT    Electrical Stimulation Action  IFC    Electrical Stimulation Parameters  80-150 hz x15 mins    Electrical Stimulation Goals  Pain                  PT Long Term Goals - 06/25/18 1631      PT LONG TERM GOAL #1   Title  Independent with a HEP.    Time  8            Plan - 07/17/18 1120    Clinical Impression Statement  Patient was able to tolerate treatment well despite initial soreness in cervical spine. Patient demonstrated good technique with  all exercises. Patient required minimal verbal cuing for proper form with new TEs. Patient educated on rationale of new overhead activities for improved functionality. Patient reported understanding. Normal response to modalities upon removal.     Stability/Clinical Decision Making  Stable/Uncomplicated    Clinical Decision Making  Low    Rehab Potential  Excellent    PT Frequency  2x / week    PT Duration  8 weeks    PT Treatment/Interventions  ADLs/Self Care Home Management;Cryotherapy;Electrical Stimulation;Moist Heat;Ultrasound;Therapeutic exercise;Therapeutic activities;Patient/family education;Manual techniques    PT Next Visit Plan  cont per cervical fusion protocol, progressive strengthening of upper thoracic and shoulder, modalites as needed and gentle STW/M.    Consulted and Agree with Plan of Care  Patient       Patient will benefit from skilled therapeutic intervention in order to improve the following deficits and impairments:  Pain, Decreased activity tolerance, Increased muscle spasms, Postural dysfunction  Visit Diagnosis: Cervicalgia  Abnormal posture     Problem List Patient Active Problem List   Diagnosis Date Noted  . Stenosis of cervical spine with myelopathy (HCC) 04/02/2018  . Cervical disc disorder with radiculopathy of cervical region 01/25/2018  . Spinal stenosis in cervical region 01/25/2018  . Cervicalgia 01/25/2018  . Heartburn 01/25/2018  . FRACTURE, ANKLE, RIGHT 08/09/2009  . ANKLE SPRAIN, RIGHT 08/09/2009  . COCAINE ABUSE 08/03/2009  . TOBACCO ABUSE 08/02/2009  . 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 INTRINSIC ASTHMA, UNSPECIFIED 03/23/2009  . Nonspecific (abnormal) findings on radiological and other examination of body structure 01/07/2009  . CT, CHEST, ABNORMAL 01/07/2009    Guss Bunde, PT, DPT 07/17/2018, 12:18 PM  [facility] [address] [place], [place], [address] Phone: [phone]    Fax:  [phone]  Name: Shawn Raymond MRN: [id number] Date of Birth: [birth year]

## 2018-07-23 ENCOUNTER — Ambulatory Visit: Payer: Self-pay | Admitting: Physical Therapy

## 2018-08-01 ENCOUNTER — Other Ambulatory Visit: Payer: Self-pay

## 2018-08-01 ENCOUNTER — Encounter: Payer: Self-pay | Admitting: Physical Therapy

## 2018-08-01 ENCOUNTER — Ambulatory Visit: Payer: Self-pay | Admitting: Physical Therapy

## 2018-08-01 DIAGNOSIS — R293 Abnormal posture: Secondary | ICD-10-CM

## 2018-08-01 DIAGNOSIS — M542 Cervicalgia: Secondary | ICD-10-CM

## 2018-08-01 NOTE — Therapy (Signed)
[facility] [address] [place], [place], [address] Phone: [phone]   Fax:  [phone]  Physical Therapy Treatment  Patient Details  Name: Shawn DadaClarence R Strack MRN: [id number] Date of Birth: [birth year] Referring Provider (PT): [facility]   Encounter Date: 08/01/2018  PT End of Session - 08/01/18 0819    Visit Number  14    Number of Visits  16    Date for PT Re-Evaluation  07/09/18    PT Start Time  0814    PT Stop Time  0906    PT Time Calculation (min)  52 min    Activity Tolerance  Patient tolerated treatment well    Behavior During Therapy  [facility] for tasks assessed/performed       Past Medical History:  Diagnosis Date  . Asthma   . Sarcoidosis     Past Surgical History:  Procedure Laterality Date  . ANTERIOR CERVICAL DECOMP/DISCECTOMY FUSION N/A 04/02/2018   Procedure: ANTERIOR CERVICAL DECOMPRESSION/DISCECTOMY FUSION CERVICAL SIX- CERVICAL SEVEN ;  Surgeon: Lisbeth RenshawNundkumar, Neelesh, MD;  [facility]: [facility];  Service: Neurosurgery;  Laterality: N/A;  . FEMUR FRACTURE SURGERY    . PATELLA RECONSTRUCTION    . POSTERIOR CERVICAL FUSION/FORAMINOTOMY N/A 04/02/2018   Procedure: CERVICAL THREE- CERVICAL FOUR, CERVICAL FOUR- CERVICAL FIVE, CERVICAL FIVE- CERVICAL SIX, CERVICAL SIX- CERVICAL SEVEN LAMINECTOMY WITH LATERAL MASS POSTERIOR SEGMENTAL INSTRUEMNTATION, POSTERIOR LATERAL ARTHRODESIS;  Surgeon: Lisbeth RenshawNundkumar, Neelesh, MD;  Location: [facility] OR;  Service: Neurosurgery;  Laterality: N/A;    There were no vitals filed for this visit.  Subjective Assessment - 08/01/18 0818    Subjective  Reports more achey today and has been doing the same workouts as provided. Reports that he had a phone visit yesterday with MD but he is scheduled for another 4-6 weeks for xrays. Reports driving his wife's car the other day and could look either way pretty well. COVID 19 screening performed on patient prior to entering building.    Patient Stated  Goals  Want to get back to weight lifting.    Currently in Pain?  Yes    Pain Score  7     Pain Location  Neck    Pain Orientation  Right;Left    Pain Descriptors / Indicators  Aching    Pain Type  Surgical pain    Pain Onset  More than a month ago    Pain Frequency  Constant         [facility] PT Assessment - 08/01/18 0001      Assessment   Medical Diagnosis  Spinal stenosis, cervical region.    Referring Provider (PT)  Cindra PresumeVincent Costella PA-C.    Onset Date/Surgical Date  04/02/18    Next MD Visit  07/31/2018      Precautions   Precautions  Cervical    Precaution Comments  Follow Cervical fusion protocol       Restrictions   Weight Bearing Restrictions  No      ROM / Strength   AROM / PROM / Strength  AROM      AROM   Overall AROM   Deficits    AROM Assessment Site  Cervical    Cervical - Right Rotation  50    Cervical - Left Rotation  50                   [facility] Adult PT Treatment/Exercise - 08/01/18 0001      Neck Exercises: Machines for Strengthening   UBE (  Upper Arm Bike)  90 RPM x8 min    Nustep  L6 x12 min      Neck Exercises: Standing   Lift / Chop  20 reps;Limitations    Left / Chop Limitations  Pink XTS X20 REPS B    Other Standing Exercises  B scapular wall slides Y x20 reps      Shoulder Exercises: Standing   Flexion  Strengthening;Both;20 reps;Weights    Shoulder Flexion Weight (lbs)  2    ABduction  Strengthening;Both;20 reps;Weights    Shoulder ABduction Weight (lbs)  2    Extension  Strengthening;Both;20 reps;Limitations    Extension Limitations  Pink XTS    Other Standing Exercises  B tricep ext pink XTS x20 reps    Other Standing Exercises  B shoulder scaption 2# x20 rep      Modalities   Modalities  Electrical Stimulation;Moist Heat      Moist Heat Therapy   Number Minutes Moist Heat  15 Minutes    Moist Heat Location  Cervical      Electrical Stimulation   Electrical Stimulation Location  B cervical, UT    Electrical  Stimulation Action  IFC    Electrical Stimulation Parameters  80-150 hz x15 min    Electrical Stimulation Goals  Pain                  PT Long Term Goals - 08/01/18 0930      PT LONG TERM GOAL #1   Title  Independent with a HEP.    Time  8    Period  Weeks    Status  Achieved      PT LONG TERM GOAL #2   Title  Eliminate bilateral UE symptoms.    Time  8    Period  Weeks    Status  On-going      PT LONG TERM GOAL #3   Title  Perform ADL's with pain not > 3/10.    Time  8    Period  Weeks    Status  On-going      PT LONG TERM GOAL #4   Title  Increase active cervical rotation to 55-60 degrees+ so patient can turn head more easily while driving.    Time  8    Period  Weeks    Status  On-going            Plan - 08/01/18 0916    Clinical Impression Statement  Patient presented in clinic with reports of increased cervical ache today. Patient still complying to weight limitations set by MD and compliant with PT provided HEPs. Patient correlating the achiness in cervical spine to weather today. Patient denied any pain during any strengthening exercises and with VCs for proper posture. Improvement noted during B cervical rotation today via measurements. Normal modalities response noted following removal of the modalities.    Stability/Clinical Decision Making  Stable/Uncomplicated    Rehab Potential  Excellent    PT Frequency  2x / week    PT Duration  8 weeks    PT Treatment/Interventions  ADLs/Self Care Home Management;Cryotherapy;Electrical Stimulation;Moist Heat;Ultrasound;Therapeutic exercise;Therapeutic activities;Patient/family education;Manual techniques    PT Next Visit Plan  cont per cervical fusion protocol, progressive strengthening of upper thoracic and shoulder, modalites as needed and gentle STW/M.    PT Home Exercise Plan  see patient education section    Consulted and Agree with Plan of Care  Patient       Patient  will benefit from skilled  therapeutic intervention in order to improve the following deficits and impairments:  Pain, Decreased activity tolerance, Increased muscle spasms, Postural dysfunction  Visit Diagnosis: Cervicalgia  Abnormal posture     Problem List Patient Active Problem List   Diagnosis Date Noted  . Stenosis of cervical spine with myelopathy (HCC) 04/02/2018  . Cervical disc disorder with radiculopathy of cervical region 01/25/2018  . Spinal stenosis in cervical region 01/25/2018  . Cervicalgia 01/25/2018  . Heartburn 01/25/2018  . FRACTURE, ANKLE, RIGHT 08/09/2009  . ANKLE SPRAIN, RIGHT 08/09/2009  . COCAINE ABUSE 08/03/2009  . TOBACCO ABUSE 08/02/2009  . 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 INTRINSIC ASTHMA, UNSPECIFIED 03/23/2009  . Nonspecific (abnormal) findings on radiological and other examination of body structure 01/07/2009  . CT, CHEST, ABNORMAL 01/07/2009    [name], PTA 08/01/2018, 9:30 AM  [facility] [address] [place], [place], [address] Phone: [phone]   Fax:  [phone]  Name: JAMAHL MCNEMAR MRN: [id number] Date of Birth: [birth year]

## 2018-08-05 ENCOUNTER — Ambulatory Visit: Payer: Self-pay | Admitting: Physical Therapy

## 2018-08-05 ENCOUNTER — Other Ambulatory Visit: Payer: Self-pay

## 2018-08-05 DIAGNOSIS — R293 Abnormal posture: Secondary | ICD-10-CM

## 2018-08-05 DIAGNOSIS — M542 Cervicalgia: Secondary | ICD-10-CM

## 2018-08-05 MED FILL — !PROVENTIL HFA 90 MCG INH: 108 (90 BAS | 25 days supply | Qty: 1 | Fill #1

## 2018-08-05 MED FILL — METHOCARBAMOL 500 MG TABS: 500 | 30 days supply | Qty: 90 | Fill #1

## 2018-08-05 MED FILL — SUMATRIPTAN SUCC 50 MG TAB: 50 | 7 days supply | Qty: 2 | Fill #2

## 2018-08-05 NOTE — Therapy (Addendum)
[facility] [address] [place], [place], [address] Phone: [phone]   Fax:  [phone]  Physical Therapy Treatment  Patient Details  Name: Shawn Raymond MRN: [id number] Date of Birth: May 20, 1980 Referring Provider (PT): [facility] PA-C.   Encounter Date: 08/05/2018  PT End of Session - 08/05/18 1057    Visit Number  15    Number of Visits  16    Date for PT Re-Evaluation  08/20/18    PT Start Time  0950    PT Stop Time  1047    PT Time Calculation (min)  57 min    Activity Tolerance  Patient tolerated treatment well    Behavior During Therapy  [facility] for tasks assessed/performed       Past Medical History:  Diagnosis Date  . Asthma   . Sarcoidosis     Past Surgical History:  Procedure Laterality Date  . ANTERIOR CERVICAL DECOMP/DISCECTOMY FUSION N/A 04/02/2018   Procedure: ANTERIOR CERVICAL DECOMPRESSION/DISCECTOMY FUSION CERVICAL SIX- CERVICAL SEVEN ;  Surgeon: Lisbeth Renshaw, MD;  [facility];  Service: Neurosurgery;  Laterality: N/A;  . FEMUR FRACTURE SURGERY    . PATELLA RECONSTRUCTION    . POSTERIOR CERVICAL FUSION/FORAMINOTOMY N/A 04/02/2018   Procedure: CERVICAL THREE- CERVICAL FOUR, CERVICAL FOUR- CERVICAL FIVE, CERVICAL FIVE- CERVICAL SIX, CERVICAL SIX- CERVICAL SEVEN LAMINECTOMY WITH LATERAL MASS POSTERIOR SEGMENTAL INSTRUEMNTATION, POSTERIOR LATERAL ARTHRODESIS;  Surgeon: Lisbeth Renshaw, MD;  Location: [facility];  Service: Neurosurgery;  Laterality: N/A;    There were no vitals filed for this visit.  Subjective Assessment - 08/05/18 1057    Subjective  COVID-19  screen performed prior to patient entering clinic.  My pain is about a 7.    Patient Stated Goals  Want to get back to weight lifting.    Currently in Pain?  Yes    Pain Score  7     Pain Location  Neck    Pain Orientation  Right;Left    Pain Descriptors / Indicators  Aching    Pain Type  Surgical pain    Pain Onset  More than a  month ago                       [facility] Adult PT Treatment/Exercise - 08/05/18 0001      Neck Exercises: Machines for Strengthening   Nustep  Level 5 x 15 minutes.      Modalities   Modalities  Electrical Stimulation;Moist Heat      Moist Heat Therapy   Number Minutes Moist Heat  20 Minutes    Moist Heat Location  Cervical      Electrical Stimulation   Electrical Stimulation Location  Bilateral UT/cervical paraspinals    Electrical Stimulation Action  Pre-mod.    Electrical Stimulation Parameters  80-150 Hz x 20 minutes (5 sec on and 5 sec off).    Electrical Stimulation Goals  Tone;Pain      Manual Therapy   Soft tissue mobilization  STW/M x 12 minutes to bilateral cervical musculature to decrease tone.                  PT Long Term Goals - 08/01/18 0930      PT LONG TERM GOAL #1   Title  Independent with a HEP.    Time  8    Period  Weeks    Status  Achieved      PT LONG TERM GOAL #2  Title  Eliminate bilateral UE symptoms.    Time  8    Period  Weeks    Status  On-going      PT LONG TERM GOAL #3   Title  Perform ADL's with pain not > 3/10.    Time  8    Period  Weeks    Status  On-going      PT LONG TERM GOAL #4   Title  Increase active cervical rotation to 55-60 degrees+ so patient can turn head more easily while driving.    Time  8    Period  Weeks    Status  On-going            Plan - 08/05/18 1103    Clinical Impression Statement  Patient continues to have tone over bilateral cervical musculature.  He responded very well to STW/M.  He has progressed very well per the cervical fusion protocol though his pain remains pretty high..    PT Treatment/Interventions  ADLs/Self Care Home Management;Cryotherapy;Electrical Stimulation;Moist Heat;Ultrasound;Therapeutic exercise;Therapeutic activities;Patient/family education;Manual techniques    PT Next Visit Plan  cont per cervical fusion protocol, progressive strengthening of upper  thoracic and shoulder, modalites as needed and gentle STW/M.    PT Home Exercise Plan  see patient education section    Consulted and Agree with Plan of Care  Patient       Patient will benefit from skilled therapeutic intervention in order to improve the following deficits and impairments:  Pain, Decreased activity tolerance, Increased muscle spasms, Postural dysfunction  Visit Diagnosis: Cervicalgia  Abnormal posture     Problem List Patient Active Problem List   Diagnosis Date Noted  . Stenosis of cervical spine with myelopathy (HCC) 04/02/2018  . Cervical disc disorder with radiculopathy of cervical region 01/25/2018  . Spinal stenosis in cervical region 01/25/2018  . Cervicalgia 01/25/2018  . Heartburn 01/25/2018  . FRACTURE, ANKLE, RIGHT 08/09/2009  . ANKLE SPRAIN, RIGHT 08/09/2009  . COCAINE ABUSE 08/03/2009  . TOBACCO ABUSE 08/02/2009  . 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 INTRINSIC ASTHMA, UNSPECIFIED 03/23/2009  . Nonspecific (abnormal) findings on radiological and other examination of body structure 01/07/2009  . CT, CHEST, ABNORMAL 01/07/2009    APPLEGATE, [place] MPT 08/05/2018, 12:19 PM  [facility] [address] [place], [place], [address] Phone: [phone]   Fax:  [phone]  Name: Edwin DadaClarence R Bojarski MRN: [id number] Date of Birth: [birth year]

## 2018-08-12 ENCOUNTER — Other Ambulatory Visit: Payer: Self-pay

## 2018-08-12 ENCOUNTER — Ambulatory Visit: Payer: 59 | Admitting: Physical Therapy

## 2018-08-12 DIAGNOSIS — R293 Abnormal posture: Secondary | ICD-10-CM

## 2018-08-12 DIAGNOSIS — M542 Cervicalgia: Secondary | ICD-10-CM

## 2018-08-12 NOTE — Therapy (Signed)
[name] [address] [place], [place], [address] Phone: [phone]   Fax:  [phone]  Physical Therapy Treatment  Patient Details  Name: Shawn Raymond MRN: [id number] Date of Birth: [birth year] Referring Provider (PT): [facility] PA-C.   Encounter Date: 08/12/2018  PT End of Session - 08/12/18 1249    Visit Number  16    Number of Visits  16    Date for PT Re-Evaluation  08/20/18    PT Start Time  1118    PT Stop Time  1210    PT Time Calculation (min)  52 min    Activity Tolerance  Patient tolerated treatment well    Behavior During Therapy  WFL for tasks assessed/performed       Past Medical History:  Diagnosis Date  . Asthma   . Sarcoidosis     Past Surgical History:  Procedure Laterality Date  . ANTERIOR CERVICAL DECOMP/DISCECTOMY FUSION N/A 04/02/2018   Procedure: ANTERIOR CERVICAL DECOMPRESSION/DISCECTOMY FUSION CERVICAL SIX- CERVICAL SEVEN ;  Surgeon: Lisbeth Renshaw, MD;  [facility]: [facility];  Service: Neurosurgery;  Laterality: N/A;  . FEMUR FRACTURE SURGERY    . PATELLA RECONSTRUCTION    . POSTERIOR CERVICAL FUSION/FORAMINOTOMY N/A 04/02/2018   Procedure: CERVICAL THREE- CERVICAL FOUR, CERVICAL FOUR- CERVICAL FIVE, CERVICAL FIVE- CERVICAL SIX, CERVICAL SIX- CERVICAL SEVEN LAMINECTOMY WITH LATERAL MASS POSTERIOR SEGMENTAL INSTRUEMNTATION, POSTERIOR LATERAL ARTHRODESIS;  Surgeon: Lisbeth Renshaw, MD;  Location: [facility] OR;  Service: Neurosurgery;  Laterality: N/A;    There were no vitals filed for this visit.  Subjective Assessment - 08/12/18 1252    Subjective  COVID-19 screen performed prior to patient entering clinic.  Patient wearing a soft collar today with a pain rating of a 9/10.  Patient not sure why his pain is up.  He states the rigth side seems to hurt more than the left.    Patient Stated Goals  Want to get back to weight lifting.    Currently in Pain?  Yes    Pain Score  9     Pain  Location  Neck    Pain Orientation  Right;Left    Pain Descriptors / Indicators  Aching    Pain Onset  More than a month ago                       [facility] Adult PT Treatment/Exercise - 08/12/18 0001      Modalities   Modalities  Electrical Stimulation;Moist Heat;Ultrasound      Moist Heat Therapy   Number Minutes Moist Heat  20 Minutes    Moist Heat Location  Cervical      Electrical Stimulation   Electrical Stimulation Location  Bilateral UT's.    Electrical Stimulation Action  IFC at 40% scan x 20 minutes.    Electrical Stimulation Parameters  80-150 Hz x 20 minutes.    Electrical Stimulation Goals  Tone;Pain      Ultrasound   Ultrasound Location  Bilateral mid-portion of UT's.    Ultrasound Parameters  1.50 W/CM2 x 12 minutes.    Ultrasound Goals  Pain   Decrease tone.     Manual Therapy   Soft tissue mobilization  STW/M x 12 minutes to bilateral UT's.                  PT Long Term Goals - 08/01/18 0930      PT LONG TERM GOAL #1   Title  Independent with a  HEP.    Time  8    Period  Weeks    Status  Achieved      PT LONG TERM GOAL #2   Title  Eliminate bilateral UE symptoms.    Time  8    Period  Weeks    Status  On-going      PT LONG TERM GOAL #3   Title  Perform ADL's with pain not > 3/10.    Time  8    Period  Weeks    Status  On-going      PT LONG TERM GOAL #4   Title  Increase active cervical rotation to 55-60 degrees+ so patient can turn head more easily while driving.    Time  8    Period  Weeks    Status  On-going            Plan - 08/12/18 1322    Clinical Impression Statement  Patient reporting increased pain for no apparent reason.  Due to the pain increase a conservative treatment was performed which did make hime feel better.       Patient will benefit from skilled therapeutic intervention in order to improve the following deficits and impairments:  Pain, Decreased activity tolerance, Increased muscle  spasms, Postural dysfunction  Visit Diagnosis: Cervicalgia  Abnormal posture     Problem List Patient Active Problem List   Diagnosis Date Noted  . Stenosis of cervical spine with myelopathy (HCC) 04/02/2018  . Cervical disc disorder with radiculopathy of cervical region 01/25/2018  . Spinal stenosis in cervical region 01/25/2018  . Cervicalgia 01/25/2018  . Heartburn 01/25/2018  . FRACTURE, ANKLE, RIGHT 08/09/2009  . ANKLE SPRAIN, RIGHT 08/09/2009  . COCAINE ABUSE 08/03/2009  . TOBACCO ABUSE 08/02/2009  . 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 INTRINSIC ASTHMA, UNSPECIFIED 03/23/2009  . Nonspecific (abnormal) findings on radiological and other examination of body structure 01/07/2009  . CT, CHEST, ABNORMAL 01/07/2009    Izaha Shughart, [place] MPT 08/12/2018, 1:33 PM  [facility] [address] [place], [place], [address] Phone: [phone]   Fax:  [phone]  Name: Shawn Raymond MRN: [id number] Date of Birth: 1981/01/21

## 2018-08-19 ENCOUNTER — Other Ambulatory Visit: Payer: Self-pay

## 2018-08-19 ENCOUNTER — Ambulatory Visit: Payer: Self-pay | Attending: Physician Assistant | Admitting: Physical Therapy

## 2018-08-19 DIAGNOSIS — R293 Abnormal posture: Secondary | ICD-10-CM | POA: Insufficient documentation

## 2018-08-19 DIAGNOSIS — M542 Cervicalgia: Secondary | ICD-10-CM | POA: Insufficient documentation

## 2018-08-19 NOTE — Therapy (Signed)
[facility] [address] [place], [place], [address] Phone: [phone]   Fax:  [phone]  Physical Therapy Treatment  Patient Details  Name: Shawn Raymond MRN: [id number] Date of Birth: 07/10/1980 Referring Provider (PT): [facility] PA-C.   Encounter Date: 08/19/2018  PT End of Session - 08/19/18 1151    Visit Number  17    Number of Visits  20    Date for PT Re-Evaluation  09/10/18    PT Start Time  1115    PT Stop Time  1205    PT Time Calculation (min)  50 min    Activity Tolerance  Patient tolerated treatment well    Behavior During Therapy  [facility] for tasks assessed/performed       Past Medical History:  Diagnosis Date  . Asthma   . Sarcoidosis     Past Surgical History:  Procedure Laterality Date  . ANTERIOR CERVICAL DECOMP/DISCECTOMY FUSION N/A 04/02/2018   Procedure: ANTERIOR CERVICAL DECOMPRESSION/DISCECTOMY FUSION CERVICAL SIX- CERVICAL SEVEN ;  Surgeon: Lisbeth Renshaw, MD;  Location: [facility] OR;  Service: Neurosurgery;  Laterality: N/A;  . FEMUR FRACTURE SURGERY    . PATELLA RECONSTRUCTION    . POSTERIOR CERVICAL FUSION/FORAMINOTOMY N/A 04/02/2018   Procedure: CERVICAL THREE- CERVICAL FOUR, CERVICAL FOUR- CERVICAL FIVE, CERVICAL FIVE- CERVICAL SIX, CERVICAL SIX- CERVICAL SEVEN LAMINECTOMY WITH LATERAL MASS POSTERIOR SEGMENTAL INSTRUEMNTATION, POSTERIOR LATERAL ARTHRODESIS;  Surgeon: Lisbeth Renshaw, MD;  Location: [facility] OR;  Service: Neurosurgery;  Laterality: N/A;    There were no vitals filed for this visit.  Subjective Assessment - 08/19/18 1149    Subjective  COVID-19 screen performed prior to patient entering clinic. That last treatment helped a lot.  My pain is down to a 6 today.    Patient Stated Goals  Want to get back to weight lifting.    Currently in Pain?  Yes    Pain Score  6     Pain Location  Neck                       [facility] Adult PT Treatment/Exercise - 08/19/18 0001       Shoulder Exercises: ROM/Strengthening   UBE (Upper Arm Bike)  Standing at 120 RPM's x 10 minutes.      Modalities   Modalities  Electrical Stimulation;Moist Heat      Moist Heat Therapy   Number Minutes Moist Heat  20 Minutes    Moist Heat Location  Cervical      Electrical Stimulation   Electrical Stimulation Location  Bilateral UT's.    Electrical Stimulation Action  IFC on 40% scan x 20 minutes.    Electrical Stimulation Goals  Tone;Pain      Manual Therapy   Soft tissue mobilization  STW/M x 14 minutes to bilateral UT's which included TP release technique.                  PT Long Term Goals - 08/01/18 0930      PT LONG TERM GOAL #1   Title  Independent with a HEP.    Time  8    Period  Weeks    Status  Achieved      PT LONG TERM GOAL #2   Title  Eliminate bilateral UE symptoms.    Time  8    Period  Weeks    Status  On-going      PT LONG TERM GOAL #3   Title  Perform ADL's with pain not > 3/10.    Time  8    Period  Weeks    Status  On-going      PT LONG TERM GOAL #4   Title  Increase active cervical rotation to 55-60 degrees+ so patient can turn head more easily while driving.    Time  8    Period  Weeks    Status  On-going            Plan - 08/19/18 1214    Clinical Impression Statement  Patient presented to the clinic today with a lowered pain-level and felt better after today's treatment.      PT Treatment/Interventions  ADLs/Self Care Home Management;Cryotherapy;Electrical Stimulation;Moist Heat;Ultrasound;Therapeutic exercise;Therapeutic activities;Patient/family education;Manual techniques    PT Next Visit Plan  cont per cervical fusion protocol, progressive strengthening of upper thoracic and shoulder, modalites as needed and gentle STW/M.    PT Home Exercise Plan  see patient education section    Consulted and Agree with Plan of Care  Patient       Patient will benefit from skilled therapeutic intervention in order to  improve the following deficits and impairments:  Pain, Decreased activity tolerance, Increased muscle spasms, Postural dysfunction  Visit Diagnosis: Cervicalgia  Abnormal posture     Problem List Patient Active Problem List   Diagnosis Date Noted  . Stenosis of cervical spine with myelopathy (HCC) 04/02/2018  . Cervical disc disorder with radiculopathy of cervical region 01/25/2018  . Spinal stenosis in cervical region 01/25/2018  . Cervicalgia 01/25/2018  . Heartburn 01/25/2018  . FRACTURE, ANKLE, RIGHT 08/09/2009  . ANKLE SPRAIN, RIGHT 08/09/2009  . COCAINE ABUSE 08/03/2009  . TOBACCO ABUSE 08/02/2009  . 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 INTRINSIC ASTHMA, UNSPECIFIED 03/23/2009  . Nonspecific (abnormal) findings on radiological and other examination of body structure 01/07/2009  . CT, CHEST, ABNORMAL 01/07/2009    Josph Norfleet, [place] MPT 08/19/2018, 12:23 PM  [facility] [address] [place], [place], [address] Phone: [phone]   Fax:  [phone]  Name: Shawn DadaClarence R Raymond MRN: [id number] Date of Birth: 14-Feb-1981

## 2018-08-22 ENCOUNTER — Encounter: Payer: Self-pay | Admitting: Physical Therapy

## 2018-08-22 ENCOUNTER — Ambulatory Visit: Payer: Self-pay | Admitting: Physical Therapy

## 2018-08-22 ENCOUNTER — Other Ambulatory Visit: Payer: Self-pay

## 2018-08-22 DIAGNOSIS — R293 Abnormal posture: Secondary | ICD-10-CM

## 2018-08-22 DIAGNOSIS — M542 Cervicalgia: Secondary | ICD-10-CM

## 2018-08-22 NOTE — Therapy (Signed)
[facility] [address] [place], [place], [address] Phone: [phone]   Fax:  [phone]  Physical Therapy Treatment  Patient Details  Name: Shawn Raymond MRN: [id number] Date of Birth: 1980/09/14 Ref[facility] Provider ([facility]   Encounter Date: 08/22/2018  PT End of Session - 08/22/18 1300    Visit Number  18    Number of Visits  20    Date for PT Re-Evaluation  09/10/18    PT Start Time  1258    PT Stop Time  1356    PT Time Calculation (min)  58 min    Activity Tolerance  Patient tolerated treatment well    Behavior During Therapy  WFL for tasks assessed/performed       Past Medical History:  Diagnosis Date  . Asthma   . Sarcoidosis     Past Surgical History:  Procedure Laterality Date  . ANTERIOR CERVICAL DECOMP/DISCECTOMY FUSION N/A 04/02/2018   Procedure: ANTERIOR CERVICAL DECOMPRESSION/DISCECTOMY FUSION CERVICAL SIX- CERVICAL SEVEN ;  Surgeon: Lisbeth RenshawNundkumar, Neelesh, MD;  Location: [facility] OR;  Service: Neurosurgery;  Laterality: N/A;  . FEMUR FRACTURE SURGERY    . PATELLA RECONSTRUCTION    . POSTERIOR CERVICAL FUSION/FORAMINOTOMY N/A 04/02/2018   Procedure: CERVICAL THREE- CERVICAL FOUR, CERVICAL FOUR- CERVICAL FIVE, CERVICAL FIVE- CERVICAL SIX, CERVICAL SIX- CERVICAL SEVEN LAMINECTOMY WITH LATERAL MASS POSTERIOR SEGMENTAL INSTRUEMNTATION, POSTERIOR LATERAL ARTHRODESIS;  Surgeon: Lisbeth RenshawNundkumar, Neelesh, MD;  Location: [facility] OR;  Service: Neurosurgery;  Laterality: N/A;    There were no vitals filed for this visit.  Subjective Assessment - 08/22/18 1259    Subjective  COVID-19 screen performed prior to patient entering clinic. Reports 6/10 cervical pain. Unable to determine the cause of his recent flare up.    Patient Stated Goals  Want to get back to weight lifting.    Currently in Pain?  Yes    Pain Score  6     Pain Location  Neck    Pain Orientation  Right;Left    Pain Descriptors / Indicators   Discomfort    Pain Type  Surgical pain    Pain Onset  More than a month ago    Pain Frequency  Constant         [facility] PT Assessment - 08/22/18 0001      Assessment   Medical Diagnosis  Spinal stenosis, cervical region.    Referring Provider (PT)  [facility]Vincent Costella PA-C.    Onset Date/Surgical Date  04/02/18    Next MD Visit  07/31/2018      Precautions   Precautions  Cervical    Precaution Comments  Follow Cervical fusion protocol       Restrictions   Weight Bearing Restrictions  No                   [facility] Adult PT Treatment/Exercise - 08/22/18 0001      Neck Exercises: Machines for Strengthening   UBE (Upper Arm Bike)  60 RPM x8 min      Neck Exercises: Theraband   Shoulder Extension  20 reps;Green    Shoulder External Rotation  20 reps;Green    Shoulder Internal Rotation  20 reps;Green    Other Theraband Exercises  B shoulder flexion green theraband x20 reps    Other Theraband Exercises  B shoulder protraction green theraband x20 reps      Neck Exercises: Standing   Upper Extremity D2  Flexion;20 reps;Theraband    Theraband Level (UE D2)  Level 3 (Green)    Other Standing Exercises  B scapular wall slides Y x20 reps      Neck Exercises: Prone   Other Prone Exercise  W back press over ball 2# x10 reps      Modalities   Modalities  Electrical Stimulation;Moist Heat      Moist Heat Therapy   Number Minutes Moist Heat  15 Minutes    Moist Heat Location  Cervical      Electrical Stimulation   Electrical Stimulation Location  B cervical paraspinals/UT    Electrical Stimulation Action  IFC    Electrical Stimulation Parameters  80-150 hz x15 min    Electrical Stimulation Goals  Tone;Pain      Manual Therapy   Manual Therapy  Soft tissue mobilization    Soft tissue mobilization  STW to B cervical paraspinals and UT as tightness/TP present                  PT Long Term Goals - 08/01/18 0930      PT LONG TERM GOAL #1   Title  Independent  with a HEP.    Time  8    Period  Weeks    Status  Achieved      PT LONG TERM GOAL #2   Title  Eliminate bilateral UE symptoms.    Time  8    Period  Weeks    Status  On-going      PT LONG TERM GOAL #3   Title  Perform ADL's with pain not > 3/10.    Time  8    Period  Weeks    Status  On-going      PT LONG TERM GOAL #4   Title  Increase active cervical rotation to 55-60 degrees+ so patient can turn head more easily while driving.    Time  8    Period  Weeks    Status  On-going            Plan - 08/22/18 1404    Clinical Impression Statement  Patient presented in clinic with 6/10 cervical pain but still able to perform resisted shoulder strengthening without complaint of pain. TPs noted in superior UT and greater muscle tightness in L cervical paraspinals. Normal modalities response noted following removal of the modalities.     Stability/Clinical Decision Making  Stable/Uncomplicated    Rehab Potential  Excellent    PT Frequency  2x / week    PT Duration  8 weeks    PT Treatment/Interventions  ADLs/Self Care Home Management;Cryotherapy;Electrical Stimulation;Moist Heat;Ultrasound;Therapeutic exercise;Therapeutic activities;Patient/family education;Manual techniques    PT Next Visit Plan  cont per cervical fusion protocol, progressive strengthening of upper thoracic and shoulder, modalites as needed and gentle STW/M.    PT Home Exercise Plan  see patient education section    Consulted and Agree with Plan of Care  Patient       Patient will benefit from skilled therapeutic intervention in order to improve the following deficits and impairments:  Pain, Decreased activity tolerance, Increased muscle spasms, Postural dysfunction  Visit Diagnosis: Cervicalgia  Abnormal posture     Problem List Patient Active Problem List   Diagnosis Date Noted  . Stenosis of cervical spine with myelopathy (HCC) 04/02/2018  . Cervical disc disorder with radiculopathy of cervical  region 01/25/2018  . Spinal stenosis in cervical region 01/25/2018  . Cervicalgia 01/25/2018  . Heartburn 01/25/2018  . FRACTURE, ANKLE, RIGHT 08/09/2009  . ANKLE  SPRAIN, RIGHT 08/09/2009  . COCAINE ABUSE 08/03/2009  . TOBACCO ABUSE 08/02/2009  . 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 INTRINSIC ASTHMA, UNSPECIFIED 03/23/2009  . Nonspecific (abnormal) findings on radiological and other examination of body structure 01/07/2009  . CT, CHEST, ABNORMAL 01/07/2009    [name], PTA 08/22/2018, 2:28 PM  [facility] [address] [place], [place], [address] Phone: [phone]   Fax:  [phone]  Name: Shawn Raymond MRN: [id number] Date of Birth: 04-28-1980

## 2018-08-26 ENCOUNTER — Ambulatory Visit: Payer: Self-pay | Admitting: Physical Therapy

## 2018-08-26 ENCOUNTER — Encounter: Payer: Self-pay | Admitting: Physical Therapy

## 2018-08-26 ENCOUNTER — Other Ambulatory Visit: Payer: Self-pay

## 2018-08-26 DIAGNOSIS — R293 Abnormal posture: Secondary | ICD-10-CM

## 2018-08-26 DIAGNOSIS — M542 Cervicalgia: Secondary | ICD-10-CM

## 2018-08-26 NOTE — Therapy (Signed)
[facility] [address] [place], [place], [address] Phone: [phone]   Fax:  [phone]  Physical Therapy Treatment  Patient Details  Name: Shawn Raymond MRN: [id number] Date of Birth: May 06, 1980 Referring Provider (PT): [facility]   Encounter Date: 08/26/2018  PT End of Session - 08/26/18 1317    Visit Number  19    Number of Visits  20    Date for PT Re-Evaluation  09/10/18    PT Start Time  1301    PT Stop Time  1351    PT Time Calculation (min)  50 min    Activity Tolerance  Patient tolerated treatment well    Behavior During Therapy  [facility] assessed/performed       Past Medical History:  Diagnosis Date  . Asthma   . Sarcoidosis     Past Surgical History:  Procedure Laterality Date  . ANTERIOR CERVICAL DECOMP/DISCECTOMY FUSION N/A 04/02/2018   Procedure: ANTERIOR CERVICAL DECOMPRESSION/DISCECTOMY FUSION CERVICAL SIX- CERVICAL SEVEN ;  Surgeon: Lisbeth RenshawNundkumar, Neelesh, MD;  Location: [facility] OR;  Service: Neurosurgery;  Laterality: N/A;  . FEMUR FRACTURE SURGERY    . PATELLA RECONSTRUCTION    . POSTERIOR CERVICAL FUSION/FORAMINOTOMY N/A 04/02/2018   Procedure: CERVICAL THREE- CERVICAL FOUR, CERVICAL FOUR- CERVICAL FIVE, CERVICAL FIVE- CERVICAL SIX, CERVICAL SIX- CERVICAL SEVEN LAMINECTOMY WITH LATERAL MASS POSTERIOR SEGMENTAL INSTRUEMNTATION, POSTERIOR LATERAL ARTHRODESIS;  Surgeon: Lisbeth RenshawNundkumar, Neelesh, MD;  Location: [facility] OR;  Service: Neurosurgery;  Laterality: N/A;    There were no vitals filed for this visit.  Subjective Assessment - 08/26/18 1315    Subjective  COVID 19 screening performed on patient prior to entering building. Reports that he has mostly stiffness today and has trouble at some intersections when driving. Reports occasionally wearing his collar. Report tightness around his abdomen and sides but thinks that may be from core pulldowns.    Patient Stated Goals  Want to get back to weight  lifting.    Currently in Pain?  Yes    Pain Score  7     Pain Location  Neck    Pain Orientation  Left;Right    Pain Descriptors / Indicators  Other (Comment)   Stiffness   Pain Type  Surgical pain    Pain Onset  More than a month ago    Pain Frequency  Constant         [facility] PT Assessment - 08/26/18 0001      Assessment   Medical Diagnosis  Spinal stenosis, cervical region.    Referring Provider (PT)  Cindra PresumeVincent Costella PA-C.    Onset Date/Surgical Date  04/02/18    Next MD Visit  07/31/2018      Precautions   Precautions  Cervical    Precaution Comments  Follow Cervical fusion protocol       Restrictions   Weight Bearing Restrictions  No      AROM   Overall AROM   Deficits    AROM Assessment Site  Cervical    Cervical - Right Rotation  50    Cervical - Left Rotation  50                   [facility] Adult PT Treatment/Exercise - 08/26/18 0001      Neck Exercises: Machines for Strengthening   UBE (Upper Arm Bike)  60 RPM x8 min      Neck Exercises: Theraband   Shoulder Extension  20 reps;Green    Rows  20 reps;Green    Shoulder External Rotation  20 reps;Green    Shoulder Internal Rotation  20 reps;Green      Neck Exercises: Standing   Upper Extremity D2  Flexion;15 reps;Theraband    Theraband Level (UE D2)  Level 3 (Green)    Wall Wash  BUE flexion 2x fatigue, abduction 2x fatigue    Other Standing Exercises  B scapular wall slides Y x20 reps      Neck Exercises: Prone   Other Prone Exercise  W back press over ball 3# x15 reps      Modalities   Modalities  Electrical Stimulation;Moist Heat      Moist Heat Therapy   Number Minutes Moist Heat  15 Minutes    Moist Heat Location  Cervical      Electrical Stimulation   Electrical Stimulation Location  B cervical paraspinals/UT    Electrical Stimulation Action  IFC    Electrical Stimulation Parameters  80-150 hz x15 min    Electrical Stimulation Goals  Tone;Pain                  PT Long  Term Goals - 08/26/18 1327      PT LONG TERM GOAL #1   Title  Independent with a HEP.    Time  8    Period  Weeks    Status  Achieved      PT LONG TERM GOAL #2   Title  Eliminate bilateral UE symptoms.    Time  8    Period  Weeks    Status  On-going      PT LONG TERM GOAL #3   Title  Perform ADL's with pain not > 3/10.    Time  8    Period  Weeks    Status  On-going      PT LONG TERM GOAL #4   Title  Increase active cervical rotation to 55-60 degrees+ so patient can turn head more easily while driving.    Time  8    Period  Weeks    Status  On-going            Plan - 08/26/18 1357    Clinical Impression Statement  Patient presented in clinic with 7/10 cervical pain related to stiffness and reports of abdominal and flank tightness which he correlated with do core/diagonal pulldowns at home. Patient guided through more resisted therex today and cervical stabilization exercises. Patient presented with palpable tightness greater on L superior UT and cervical paraspinals. AROM B cervical rotation measured as 50 deg. Patient continues to experience UE symptoms with more tingling in RUE from 4-5th digits to medial forearm. LUE symptoms is more hypothenar aching but no symptoms in forearm. Patient also reports experiencing medial R thigh to calf tingling at times. No complaints of any pain or discomfort during therex. Normal modalities response noted following removal of the modalities.    Stability/Clinical Decision Making  Stable/Uncomplicated    Rehab Potential  Excellent    PT Frequency  2x / week    PT Duration  8 weeks    PT Treatment/Interventions  ADLs/Self Care Home Management;Cryotherapy;Electrical Stimulation;Moist Heat;Ultrasound;Therapeutic exercise;Therapeutic activities;Patient/family education;Manual techniques    PT Next Visit Plan  cont per cervical fusion protocol, progressive strengthening of upper thoracic and shoulder, modalites as needed and gentle STW/M.    PT  Home Exercise Plan  see patient education section    Consulted and Agree with Plan of Care  Patient  Patient will benefit from skilled therapeutic intervention in order to improve the following deficits and impairments:  Pain, Decreased activity tolerance, Increased muscle spasms, Postural dysfunction  Visit Diagnosis: Cervicalgia  Abnormal posture     Problem List Patient Active Problem List   Diagnosis Date Noted  . Stenosis of cervical spine with myelopathy (HCC) 04/02/2018  . Cervical disc disorder with radiculopathy of cervical region 01/25/2018  . Spinal stenosis in cervical region 01/25/2018  . Cervicalgia 01/25/2018  . Heartburn 01/25/2018  . FRACTURE, ANKLE, RIGHT 08/09/2009  . ANKLE SPRAIN, RIGHT 08/09/2009  . COCAINE ABUSE 08/03/2009  . TOBACCO ABUSE 08/02/2009  . 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 INTRINSIC ASTHMA, UNSPECIFIED 03/23/2009  . Nonspecific (abnormal) findings on radiological and other examination of body structure 01/07/2009  . CT, CHEST, ABNORMAL 01/07/2009    Shawn Raymond, PTA 08/26/2018, 2:05 PM  [facility] [address] [place], [place], [address] Phone: [phone]   Fax:  [phone]  Name: Shawn Raymond MRN: [id number] Date of Birth: Mar 11, 1981

## 2018-09-02 ENCOUNTER — Other Ambulatory Visit: Payer: Self-pay

## 2018-09-02 ENCOUNTER — Encounter: Payer: Self-pay | Admitting: Physical Therapy

## 2018-09-02 ENCOUNTER — Ambulatory Visit: Payer: Self-pay | Admitting: Physical Therapy

## 2018-09-02 DIAGNOSIS — M542 Cervicalgia: Secondary | ICD-10-CM

## 2018-09-02 DIAGNOSIS — R293 Abnormal posture: Secondary | ICD-10-CM

## 2018-09-02 NOTE — Therapy (Signed)
[facility] [address] [place], [place], [address] Phone: [phone]   Fax:  [phone]  Physical Therapy Treatment  Patient Details  Name: Shawn Raymond MRN: [id number] Date of Birth: [birth year] Referring Provider (PT): [facility] PA-C.   Encounter Date: 09/02/2018  PT End of Session - 09/02/18 1122    Visit Number  20    Number of Visits  20    Date for PT Re-Evaluation  09/10/18    PT Start Time  1120    PT Stop Time  1206    PT Time Calculation (min)  46 min    Activity Tolerance  Patient tolerated treatment well    Behavior During Therapy  [facility] for tasks assessed/performed       Past Medical History:  Diagnosis Date  . Asthma   . Sarcoidosis     Past Surgical History:  Procedure Laterality Date  . ANTERIOR CERVICAL DECOMP/DISCECTOMY FUSION N/A 04/02/2018   Procedure: ANTERIOR CERVICAL DECOMPRESSION/DISCECTOMY FUSION CERVICAL SIX- CERVICAL SEVEN ;  Surgeon: Lisbeth Renshaw, MD;  Location: [facility] OR;  Service: Neurosurgery;  Laterality: N/A;  . FEMUR FRACTURE SURGERY    . PATELLA RECONSTRUCTION    . POSTERIOR CERVICAL FUSION/FORAMINOTOMY N/A 04/02/2018   Procedure: CERVICAL THREE- CERVICAL FOUR, CERVICAL FOUR- CERVICAL FIVE, CERVICAL FIVE- CERVICAL SIX, CERVICAL SIX- CERVICAL SEVEN LAMINECTOMY WITH LATERAL MASS POSTERIOR SEGMENTAL INSTRUEMNTATION, POSTERIOR LATERAL ARTHRODESIS;  Surgeon: Lisbeth Renshaw, MD;  Location: [facility] OR;  Service: Neurosurgery;  Laterality: N/A;    There were no vitals filed for this visit.  Subjective Assessment - 09/02/18 1121    Subjective  COVID 19 screening performed prior to patient entering building. Patient rated cervical pain as 6/10.    Patient Stated Goals  Want to get back to weight lifting.    Currently in Pain?  Yes    Pain Score  6     Pain Location  Neck    Pain Orientation  Left;Right    Pain Descriptors / Indicators  Discomfort    Pain Type  Surgical pain     Pain Onset  More than a month ago    Pain Frequency  Constant         [facility] PT Assessment - 09/02/18 0001      Assessment   Medical Diagnosis  Spinal stenosis, cervical region.    Referring Provider (PT)  Cindra Presume PA-C.    Onset Date/Surgical Date  04/02/18      Precautions   Precautions  Cervical    Precaution Comments  Follow Cervical fusion protocol       Restrictions   Weight Bearing Restrictions  No                   [facility] Adult PT Treatment/Exercise - 09/02/18 0001      Neck Exercises: Machines for Strengthening   UBE (Upper Arm Bike)  60 RPM x8 min      Neck Exercises: Standing   Lift / Chop  20 reps;Limitations    Left / Chop Limitations  BUE for each       Neck Exercises: Supine   Cervical Rotation  Both;10 reps;Limitations    Cervical Rotation Limitations  5 sec holds       Neck Exercises: Prone   Other Prone Exercise  WITTY 3# over ball 2x5 reps      Shoulder Exercises: Standing   Protraction  Strengthening;Both;Theraband;Limitations    Theraband Level (Shoulder Protraction)  Level 3 (Green)  Protraction Limitations  x30 reps    Extension  Strengthening;Both;20 reps;Limitations    Extension Limitations  Orange XTS    Row  Strengthening;Both;20 reps;Limitations    Row Limitations  Orange XTS      Modalities   Modalities  Electrical Stimulation;Moist Heat      Moist Heat Therapy   Number Minutes Moist Heat  15 Minutes    Moist Heat Location  Cervical      Electrical Stimulation   Electrical Stimulation Location  B cervical paraspinals/UT    Electrical Stimulation Action  IFC    Electrical Stimulation Parameters  80-150 hz x15 min    Electrical Stimulation Goals  Tone;Pain                  PT Long Term Goals - 08/26/18 1327      PT LONG TERM GOAL #1   Title  Independent with a HEP.    Time  8    Period  Weeks    Status  Achieved      PT LONG TERM GOAL #2   Title  Eliminate bilateral UE symptoms.    Time  8     Period  Weeks    Status  On-going      PT LONG TERM GOAL #3   Title  Perform ADL's with pain not > 3/10.    Time  8    Period  Weeks    Status  On-going      PT LONG TERM GOAL #4   Title  Increase active cervical rotation to 55-60 degrees+ so patient can turn head more easily while driving.    Time  8    Period  Weeks    Status  On-going            Plan - 09/02/18 1216    Clinical Impression Statement  Patient presented in clinic with 6/10 cervical pain with no reasoning reported for cause of pain. Patient instructed verbally for core activation with standing exercises. New prone over ball WITTY completed today with patient reporting fatigue. Patient interested in completing WITTY at home and instructed regarding 3# handweights use and to complete on incline so exercise could be monitored as progressed to full prone. Patient verbalized understanding. Supine cervical rotation completed as well to progress ROM goal with no complaints from patient. Patient still compliant with HEPs at home. Normal modalities response noted following removal of the modalities.    Stability/Clinical Decision Making  Stable/Uncomplicated    Rehab Potential  Excellent    PT Frequency  2x / week    PT Duration  8 weeks    PT Treatment/Interventions  ADLs/Self Care Home Management;Cryotherapy;Electrical Stimulation;Moist Heat;Ultrasound;Therapeutic exercise;Therapeutic activities;Patient/family education;Manual techniques    PT Next Visit Plan  cont per cervical fusion protocol, progressive strengthening of upper thoracic and shoulder, modalites as needed and gentle STW/M.    PT Home Exercise Plan  see patient education section    Consulted and Agree with Plan of Care  Patient       Patient will benefit from skilled therapeutic intervention in order to improve the following deficits and impairments:  Pain, Decreased activity tolerance, Increased muscle spasms, Postural dysfunction  Visit  Diagnosis: Cervicalgia  Abnormal posture     Problem List Patient Active Problem List   Diagnosis Date Noted  . Stenosis of cervical spine with myelopathy (HCC) 04/02/2018  . Cervical disc disorder with radiculopathy of cervical region 01/25/2018  . Spinal stenosis in cervical  region 01/25/2018  . Cervicalgia 01/25/2018  . Heartburn 01/25/2018  . FRACTURE, ANKLE, RIGHT 08/09/2009  . ANKLE SPRAIN, RIGHT 08/09/2009  . COCAINE ABUSE 08/03/2009  . TOBACCO ABUSE 08/02/2009  . 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 INTRINSIC ASTHMA, UNSPECIFIED 03/23/2009  . Nonspecific (abnormal) findings on radiological and other examination of body structure 01/07/2009  . CT, CHEST, ABNORMAL 01/07/2009    Marvell FullerKelsey P Kennon, PTA 09/02/2018, 12:23 PM  [facility] [address] [place], [place], [address] Phone: [phone]   Fax:  [phone]  Name: Shawn DadaClarence R Raymond MRN: [id number] Date of Birth: [birth year]

## 2018-09-04 ENCOUNTER — Ambulatory Visit: Payer: Self-pay | Admitting: Physical Therapy

## 2018-09-04 ENCOUNTER — Encounter: Payer: Self-pay | Admitting: Physical Therapy

## 2018-09-04 ENCOUNTER — Other Ambulatory Visit: Payer: Self-pay

## 2018-09-04 DIAGNOSIS — M542 Cervicalgia: Secondary | ICD-10-CM

## 2018-09-04 DIAGNOSIS — R293 Abnormal posture: Secondary | ICD-10-CM

## 2018-09-04 NOTE — Therapy (Signed)
[facility] [place], [place], [address] Phone: [phone]   Fax:  [phone]  Physical Therapy Treatment PHYSICAL THERAPY DISCHARGE SUMMARY  Visits from Start of Care: 20  Current functional level related to goals / functional outcomes: See below   Remaining deficits: See goals   Education / Equipment: HEP   Plan: Patient agrees to discharge.  Patient goals were partially met. Patient is being discharged due to lack of progress.  ?????  Gabriela Eves, PT, DPT    Patient Details  Name: Shawn Raymond MRN: [id number] Date of Birth: [birth year] Referring Provider (PT): [facility] PA-C.   Encounter Date: 09/04/2018  PT End of Session - 09/04/18 1007    Visit Number  21    Number of Visits  20    Date for PT Re-Evaluation  09/10/18    PT Start Time  0957    PT Stop Time  1046    PT Time Calculation (min)  49 min    Activity Tolerance  Patient tolerated treatment well    Behavior During Therapy  [facility] for tasks assessed/performed       Past Medical History:  Diagnosis Date  . Asthma   . Sarcoidosis     Past Surgical History:  Procedure Laterality Date  . ANTERIOR CERVICAL DECOMP/DISCECTOMY FUSION N/A 04/02/2018   Procedure: ANTERIOR CERVICAL DECOMPRESSION/DISCECTOMY FUSION CERVICAL SIX- CERVICAL SEVEN ;  Surgeon: Consuella Lose, MD;  Location: [facility];  Service: Neurosurgery;  Laterality: N/A;  . FEMUR FRACTURE SURGERY    . PATELLA RECONSTRUCTION    . POSTERIOR CERVICAL FUSION/FORAMINOTOMY N/A 04/02/2018   Procedure: CERVICAL THREE- CERVICAL FOUR, CERVICAL FOUR- CERVICAL FIVE, CERVICAL FIVE- CERVICAL SIX, CERVICAL SIX- CERVICAL SEVEN LAMINECTOMY WITH LATERAL MASS POSTERIOR SEGMENTAL INSTRUEMNTATION, POSTERIOR LATERAL ARTHRODESIS;  Surgeon: Consuella Lose, MD;  Location: [facility];  Service: Neurosurgery;  Laterality: N/A;    There were no vitals filed for this visit.  Subjective Assessment  - 09/04/18 1003    Subjective  COVID 19 screening performed prior to patient entering building. Patient reported feeling alright, 5/10 pain in the neck today.    Patient Stated Goals  Want to get back to weight lifting.    Currently in Pain?  Yes    Pain Score  5     Pain Location  Neck    Pain Orientation  Left;Right    Pain Descriptors / Indicators  Discomfort    Pain Type  Surgical pain    Pain Onset  More than a month ago    Pain Frequency  Constant         [facility] PT Assessment - 09/04/18 0001      Assessment   Medical Diagnosis  Spinal stenosis, cervical region.    Referring Provider (PT)  Ferne Reus PA-C.    Onset Date/Surgical Date  04/02/18      Precautions   Precautions  Cervical    Precaution Comments  Follow Cervical fusion protocol       Restrictions   Weight Bearing Restrictions  No      AROM   Cervical - Right Rotation  50    Cervical - Left Rotation  50                   [facility] Adult PT Treatment/Exercise - 09/04/18 0001      Neck Exercises: Machines for Strengthening   UBE (Upper Arm Bike)  60 RPM x8 min      Neck  Exercises: Standing   Lift / Chop  20 reps;Limitations    Left / Chop Limitations  BUE for each Pink XTS      Shoulder Exercises: Standing   Protraction  Strengthening;Both;Theraband;Limitations    Protraction Limitations  x30 reps Pink XTS    Diagonals  Strengthening;Both;20 reps    Diagonals Weight (lbs)  6#    Diagonals Limitations  with wall squat    Other Standing Exercises  mid rows Pink XTS x20, Y's pink XTS x20    Other Standing Exercises  lunge with overhead reach x20 6#      Modalities   Modalities  Electrical Stimulation;Moist Heat      Moist Heat Therapy   Number Minutes Moist Heat  15 Minutes    Moist Heat Location  Cervical      Electrical Stimulation   Electrical Stimulation Location  B cervical paraspinals/UT    Electrical Stimulation Action  IFC    Electrical Stimulation Parameters  80-150 hz x15  mis    Electrical Stimulation Goals  Tone;Pain                  PT Long Term Goals - 09/04/18 1043      PT LONG TERM GOAL #1   Title  Independent with a HEP.    Time  8    Period  Weeks    Status  Achieved      PT LONG TERM GOAL #2   Title  Eliminate bilateral UE symptoms.    Time  8    Period  Weeks    Status  Partially Met      PT LONG TERM GOAL #3   Title  Perform ADL's with pain not > 3/10.    Time  8    Period  Weeks    Status  Partially Met      PT LONG TERM GOAL #4   Title  Increase active cervical rotation to 55-60 degrees+ so patient can turn head more easily while driving.    Time  8    Period  Weeks    Status  Partially Met            Plan - 09/04/18 1039    Clinical Impression Statement  Patient was able to tolerate treatment well. Patient and PT reviewed HEP as well as discussed return to exercise. Patient instructed to ask surgeon next follow up visit for returning to weight lifting and specifics on what to avoid. Patient reported understanding. Patient to be discharged with goals partially met.     Stability/Clinical Decision Making  Stable/Uncomplicated    Clinical Decision Making  Low    Rehab Potential  Excellent    PT Frequency  2x / week    PT Duration  8 weeks    PT Treatment/Interventions  ADLs/Self Care Home Management;Cryotherapy;Electrical Stimulation;Moist Heat;Ultrasound;Therapeutic exercise;Therapeutic activities;Patient/family education;Manual techniques    PT Next Visit Plan  DC    Consulted and Agree with Plan of Care  Patient       Patient will benefit from skilled therapeutic intervention in order to improve the following deficits and impairments:  Pain, Decreased activity tolerance, Increased muscle spasms, Postural dysfunction  Visit Diagnosis: Cervicalgia  Abnormal posture     Problem List Patient Active Problem List   Diagnosis Date Noted  . Stenosis of cervical spine with myelopathy ([facility]) 04/02/2018  .  Cervical disc disorder with radiculopathy of cervical region 01/25/2018  . Spinal stenosis in cervical region  01/25/2018  . Cervicalgia 01/25/2018  . Heartburn 01/25/2018  . FRACTURE, ANKLE, RIGHT 08/09/2009  . ANKLE SPRAIN, RIGHT 08/09/2009  . COCAINE ABUSE 08/03/2009  . TOBACCO ABUSE 08/02/2009  . 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 INTRINSIC ASTHMA, UNSPECIFIED 03/23/2009  . Nonspecific (abnormal) findings on radiological and other examination of body structure 01/07/2009  . CT, CHEST, ABNORMAL 01/07/2009   Gabriela Eves, PT, DPT 09/04/2018, 11:09 AM  [facility] [address] [place], [place], [address] Phone: [phone]   Fax:  [phone]  Name: Shawn Raymond MRN: [id number] Date of Birth: 1980/11/11

## 2018-09-06 ENCOUNTER — Other Ambulatory Visit (INDEPENDENT_AMBULATORY_CARE_PROVIDER_SITE_OTHER): Payer: Self-pay | Admitting: Internal Medicine

## 2018-09-06 ENCOUNTER — Other Ambulatory Visit: Payer: Self-pay | Admitting: Family Medicine

## 2018-09-06 DIAGNOSIS — R12 Heartburn: Secondary | ICD-10-CM

## 2018-09-06 DIAGNOSIS — G44019 Episodic cluster headache, not intractable: Secondary | ICD-10-CM

## 2018-09-06 MED FILL — ?SUMATRIPTAN SUCC 50MG TAB: 50 | 30 days supply | Qty: 10 | Fill #0

## 2018-09-06 MED FILL — ?OMEPRAZOLE 20 MG CAPSULE D: 20 | 30 days supply | Qty: 60 | Fill #0

## 2018-09-18 ENCOUNTER — Other Ambulatory Visit: Payer: Self-pay | Admitting: Family Medicine

## 2018-09-18 DIAGNOSIS — J45909 Unspecified asthma, uncomplicated: Secondary | ICD-10-CM

## 2018-09-18 MED FILL — $VENTOLIN HFA 18G INHALER: 108 (90 BAS | 25 days supply | Qty: 18 | Fill #0

## 2018-09-18 MED FILL — $Symbicort 160-4.5mcg/act: 160-4.5 | 30 days supply | Qty: 1 | Fill #8

## 2018-10-21 MED FILL — $Symbicort 160-4.5mcg/act: 160-4.5 | 30 days supply | Qty: 1 | Fill #9

## 2018-10-21 MED FILL — ?OMEPRAZOLE 20 MG CAPSULE D: 20 | 30 days supply | Qty: 60 | Fill #1

## 2018-10-21 MED FILL — METHOCARBAMOL 500 MG TABS: 500 | 10 days supply | Qty: 30 | Fill #2

## 2018-10-21 MED FILL — $VENTOLIN HFA 18G INHALER: 108 (90 BAS | 25 days supply | Qty: 18 | Fill #1

## 2018-12-05 ENCOUNTER — Ambulatory Visit (INDEPENDENT_AMBULATORY_CARE_PROVIDER_SITE_OTHER): Payer: Self-pay | Admitting: Primary Care

## 2018-12-05 ENCOUNTER — Encounter (INDEPENDENT_AMBULATORY_CARE_PROVIDER_SITE_OTHER): Payer: Self-pay | Admitting: Primary Care

## 2018-12-05 ENCOUNTER — Other Ambulatory Visit: Payer: Self-pay

## 2018-12-05 VITALS — BP 140/98 | HR 74 | Temp 97.6°F | Ht 66.0 in | Wt 205.0 lb

## 2018-12-05 DIAGNOSIS — Z760 Encounter for issue of repeat prescription: Secondary | ICD-10-CM

## 2018-12-05 DIAGNOSIS — R12 Heartburn: Secondary | ICD-10-CM

## 2018-12-05 DIAGNOSIS — I10 Essential (primary) hypertension: Secondary | ICD-10-CM

## 2018-12-05 DIAGNOSIS — M5412 Radiculopathy, cervical region: Secondary | ICD-10-CM

## 2018-12-05 DIAGNOSIS — J45909 Unspecified asthma, uncomplicated: Secondary | ICD-10-CM

## 2018-12-05 DIAGNOSIS — G44019 Episodic cluster headache, not intractable: Secondary | ICD-10-CM

## 2018-12-05 MED ORDER — METHOCARBAMOL 500 MG PO TABS: 500.0000 mg | ORAL_TABLET | Freq: Three times a day (TID) | ORAL | 2 refills | Status: DC

## 2018-12-05 MED ORDER — SUMATRIPTAN SUCCINATE 50 MG PO TABS: ORAL_TABLET | ORAL | 1 refills | Status: DC

## 2018-12-05 MED ORDER — HYDROCHLOROTHIAZIDE 25 MG PO TABS: 25.0000 mg | ORAL_TABLET | Freq: Every day | ORAL | 3 refills | Status: DC

## 2018-12-05 MED ORDER — BUDESONIDE-FORMOTEROL FUMARATE 160-4.5 MCG/ACT IN AERO: 2.0000 | INHALATION_SPRAY | Freq: Two times a day (BID) | RESPIRATORY_TRACT | 11 refills | Status: DC

## 2018-12-05 MED ORDER — ALBUTEROL SULFATE HFA 108 (90 BASE) MCG/ACT IN AERS: INHALATION_SPRAY | RESPIRATORY_TRACT | 2 refills | Status: DC

## 2018-12-05 MED ORDER — OMEPRAZOLE 40 MG PO CPDR: 40.0000 mg | DELAYED_RELEASE_CAPSULE | Freq: Every day | ORAL | 2 refills | Status: DC

## 2018-12-05 MED FILL — $VENTOLIN HFA 18G INHALER: 108 (90 BAS | 16 days supply | Qty: 18 | Fill #0

## 2018-12-05 MED FILL — METHOCARBAMOL 500 MG TABS: 500 | 20 days supply | Qty: 60 | Fill #0

## 2018-12-05 MED FILL — SUMATRIPTAN SUCC 50 MG TAB: 50 | 5 days supply | Qty: 10 | Fill #0

## 2018-12-05 MED FILL — OMEPRAZOLE DR 40 MG CAPSULE: 40 | 30 days supply | Qty: 30 | Fill #0

## 2018-12-05 MED FILL — $Symbicort 160-4.5mcg/act: 160-4.5 | 30 days supply | Qty: 1 | Fill #0

## 2018-12-05 MED FILL — HYDROCHLOROTHIAZIDE 25 MG T: 25 | 30 days supply | Qty: 30 | Fill #0

## 2018-12-05 NOTE — Patient Instructions (Signed)

## 2018-12-05 NOTE — Progress Notes (Signed)
Established Patient Office Visit  Subjective:  Patient ID: Shawn Raymond, male    DOB: 10-Jul-1980  Age: 38 y.o. MRN: [id number]  CC:  Chief Complaint  Patient presents with  . Follow-up    cervical surgery     HPI Shawn DadaClarence R Raymond presents for on your headaches continue to have them unable to associate with any foods, drinks or activities. (head log) . He continues to have cervical radiculopathy release from neurosurgery but continues to have pain.  Past Medical History:  Diagnosis Date  . Asthma   . Sarcoidosis     Past Surgical History:  Procedure Laterality Date  . ANTERIOR CERVICAL DECOMP/DISCECTOMY FUSION N/A 04/02/2018   Procedure: ANTERIOR CERVICAL DECOMPRESSION/DISCECTOMY FUSION CERVICAL SIX- CERVICAL SEVEN ;  Surgeon: Lisbeth RenshawNundkumar, Neelesh, MD;  Location: [facility];  Service: Neurosurgery;  Laterality: N/A;  . FEMUR FRACTURE SURGERY    . PATELLA RECONSTRUCTION    . POSTERIOR CERVICAL FUSION/FORAMINOTOMY N/A 04/02/2018   Procedure: CERVICAL THREE- CERVICAL FOUR, CERVICAL FOUR- CERVICAL FIVE, CERVICAL FIVE- CERVICAL SIX, CERVICAL SIX- CERVICAL SEVEN LAMINECTOMY WITH LATERAL MASS POSTERIOR SEGMENTAL INSTRUEMNTATION, POSTERIOR LATERAL ARTHRODESIS;  Surgeon: Lisbeth RenshawNundkumar, Neelesh, MD;  Location: [facility];  Service: Neurosurgery;  Laterality: N/A;    Family History  Problem Relation Age of Onset  . Healthy Mother   . Healthy Father     Social History   Socioeconomic History  . Marital status: Divorced    Spouse name: Not on file  . Number of children: Not on file  . Years of education: Not on file  . Highest education level: Not on file  Occupational History  . Not on file  Social Needs  . Financial resource strain: Not on file  . Food insecurity    Worry: Not on file    Inability: Not on file  . Transportation needs    Medical: Not on file    Non-medical: Not on file  Tobacco Use  . Smoking status: Former Smoker    Packs/day: 0.70    Types: Cigarettes     Quit date: 12/24/2017    Years since quitting: 0.9  . Smokeless tobacco: Never Used  . Tobacco comment: patient has been  without a cigarette since 12/27/17  Substance and Sexual Activity  . Alcohol use: No  . Drug use: Yes    Types: Marijuana    Comment: 3grams a day.   Marland Kitchen. Sexual activity: Not on file  Lifestyle  . Physical activity    Days per week: Not on file    Minutes per session: Not on file  . Stress: Not on file  Relationships  . Social Musicianconnections    Talks on phone: Not on file    Gets together: Not on file    Attends religious service: Not on file    Active member of club or organization: Not on file    Attends meetings of clubs or organizations: Not on file    Relationship status: Not on file  . Intimate partner violence    Fear of current or ex partner: Not on file    Emotionally abused: Not on file    Physically abused: Not on file    Forced sexual activity: Not on file  Other Topics Concern  . Not on file  Social History Narrative  . Not on file    Outpatient Medications Prior to Visit  Medication Sig Dispense Refill  . albuterol (VENTOLIN HFA) 108 (90 Base) MCG/ACT inhaler INHALE 2 PUFFS INTO THE  LUNGS EVERY 4 (FOUR) HOURS AS NEEDED FOR WHEEZING OR SHORTNESS OF BREATH. 18 g 2  . budesonide-formoterol (SYMBICORT) 160-4.5 MCG/ACT inhaler Inhale 2 puffs into the lungs 2 (two) times daily. 1 Inhaler 11  . methocarbamol (ROBAXIN) 500 MG tablet TAKE 1 TABLET (500 MG TOTAL) BY MOUTH 3 (THREE) TIMES DAILY. 60 tablet 2  . omeprazole (PRILOSEC) 40 MG capsule TAKE 1 CAPSULE (40 MG TOTAL) BY MOUTH DAILY. 30 capsule 2  . SUMAtriptan (IMITREX) 50 MG tablet TAKE 1 TABLET BY MOUTH AT THE START OF THE HEADACHE. MAY REPEAT IN 2 HOURS IF NO RESPONSE. NO MORE THAN 2 TABLETS IN 24 HOURS 10 tablet 1   No facility-administered medications prior to visit.     Allergies  Allergen Reactions  . Shellfish Allergy Anaphylaxis    ROS Review of Systems  Constitutional: Positive for  activity change.  Musculoskeletal: Positive for neck pain and neck stiffness.      Objective:    Physical Exam  Constitutional: He is oriented to person, place, and time. He appears well-developed and well-nourished.  HENT:  Head: Normocephalic.  Cardiovascular: Normal rate and regular rhythm.  Pulmonary/Chest: Effort normal and breath sounds normal.  Abdominal: Soft. He exhibits distension.  Lymphadenopathy:    He has cervical adenopathy.  Neurological: He is oriented to person, place, and time.  Psychiatric: He has a normal mood and affect.    BP (!) 140/98 (BP Location: Left Arm, Patient Position: Sitting, Cuff Size: Small)   Pulse 74   Temp 97.6 F (36.4 C) (Tympanic)   Ht 5\' 6"  (1.676 m)   Wt 205 lb (93 kg)   SpO2 99%   BMI 33.09 kg/m  Wt Readings from Last 3 Encounters:  12/05/18 205 lb (93 kg)  06/06/18 199 lb (90.3 kg)  05/13/18 191 lb 12.8 oz (87 kg)     Health Maintenance Due  Topic Date Due  . INFLUENZA VACCINE  11/16/2018    There are no preventive care reminders to display for this patient.  Lab Results  Component Value Date   TSH 2.220 02/08/2018   Lab Results  Component Value Date   WBC 19.8 (H) 04/03/2018   HGB 13.1 04/03/2018   HCT 39.8 04/03/2018   MCV 88.8 04/03/2018   PLT 239 04/03/2018   Lab Results  Component Value Date   NA 136 04/03/2018   K 3.8 04/03/2018   CO2 26 04/03/2018   GLUCOSE 130 (H) 04/03/2018   BUN 8 04/03/2018   CREATININE 1.20 04/03/2018   BILITOT 0.2 02/08/2018   ALKPHOS 115 02/08/2018   AST 21 02/08/2018   ALT 34 02/08/2018   PROT 6.8 02/08/2018   ALBUMIN 4.3 02/08/2018   CALCIUM 9.1 04/03/2018   ANIONGAP 13 04/03/2018   No results found for: CHOL No results found for: HDL No results found for: LDLCALC No results found for: TRIG No results found for: CHOLHDL No results found for: [id number]    Assessment & Plan:   Shawn Raymond was seen today for follow-up.  Diagnoses and all orders for this  visit:  Cervical radiculopathy severe cervical stenosis-decompression of the cervical stenosis via C3 through C7 laminectomy with placement of lateral mass screws for posterolateral arthrodesis followed by C6-7 ACDF. Surgery done by Dr. Lisbeth RenshawNeelesh Nundkumar  04/02/2018 .  -     methocarbamol (ROBAXIN) 500 MG tablet; Take 1 tablet (500 mg total) by mouth 3 (three) times daily.  Episodic cluster headache, not intractable Signs can be individualized with throbbing pain  on any area of the head.  They may come with an aura-dizziness, nausea, sensitive to light sound or smell.  Triggers can be caused by drinking alcohol smoking or certain medications, eating and drinking certain products  This condition may be triggered or caused by: Caffeine, aged cheese, and chocolate. -      Uncomplicated asthma, unspecified asthma severity, unspecified whether persistent Asthma is recognize by classic presentation of symptoms which include cough, wheeze, and shortness of breath brought on by characteristic triggers and relieved by bronchodilating medications.  This is caused by from hyper responsiveness with tendency of airways to narrow excessively in response to a stimuli. -     albuterol (VENTOLIN HFA) 108 (90 Base) MCG/ACT inhaler; INHALE 2 PUFFS INTO THE LUNGS EVERY 4 (FOUR) HOURS AS NEEDED FOR WHEEZING OR SHORTNESS OF BREATH. -     budesonide-formoterol (SYMBICORT) 160-4.5 MCG/ACT inhaler; Inhale 2 puffs into the lungs 2 (two) times daily. SUMAtriptan (IMITREX) 50 MG tablet; TAKE 1 TABLET BY MOUTH AT THE START OF THE HEADACHE. MAY REPEAT IN 2 HOURS IF NO RESPONSE. NO MORE THAN 2 TABLETS IN 24 HOURS   Refill clinic medication management patient  omeprazole (PRILOSEC) 40 MG capsule; Take 1 capsule (40 mg total) by mouth daily.  Heartburn Discussed eating small frequent meal, reduction in acidic foods, fried foods ,spicy foods, alcohol caffeine and tobacco and certain medications. Avoid laying down after eating  72mins-1hour, elevated head of the bed. -     Essential hypertension Counseled on blood pressure goal of less than 130/80, low-sodium, DASH diet, medication compliance, 150 minutes of moderate intensity exercise per week. Started on HCTZ 25mg  every morning Discussed medication compliance, adverse effects.  Other orders -     hydrochlorothiazide (HYDRODIURIL) 25 MG tablet; Take 1 tablet (25 mg total) by mouth daily.    Meds ordered this encounter  Medications  . albuterol (VENTOLIN HFA) 108 (90 Base) MCG/ACT inhaler    Sig: INHALE 2 PUFFS INTO THE LUNGS EVERY 4 (FOUR) HOURS AS NEEDED FOR WHEEZING OR SHORTNESS OF BREATH.    Dispense:  18 g    Refill:  2  . budesonide-formoterol (SYMBICORT) 160-4.5 MCG/ACT inhaler    Sig: Inhale 2 puffs into the lungs 2 (two) times daily.    Dispense:  1 Inhaler    Refill:  11  . methocarbamol (ROBAXIN) 500 MG tablet    Sig: Take 1 tablet (500 mg total) by mouth 3 (three) times daily.    Dispense:  60 tablet    Refill:  2    PATIENT IS REQUESTING 3 MONTH SUPPLY DUE TO THE VIRUS  . omeprazole (PRILOSEC) 40 MG capsule    Sig: Take 1 capsule (40 mg total) by mouth daily.    Dispense:  30 capsule    Refill:  2  . SUMAtriptan (IMITREX) 50 MG tablet    Sig: TAKE 1 TABLET BY MOUTH AT THE START OF THE HEADACHE. MAY REPEAT IN 2 HOURS IF NO RESPONSE. NO MORE THAN 2 TABLETS IN 24 HOURS    Dispense:  10 tablet    Refill:  1  . hydrochlorothiazide (HYDRODIURIL) 25 MG tablet    Sig: Take 1 tablet (25 mg total) by mouth daily.    Dispense:  30 tablet    Refill:  3    Follow-up: Return in about 3 weeks (around 12/26/2018) for Bp re check.    Kerin Perna, NP

## 2018-12-26 ENCOUNTER — Other Ambulatory Visit: Payer: Self-pay

## 2018-12-26 ENCOUNTER — Encounter (INDEPENDENT_AMBULATORY_CARE_PROVIDER_SITE_OTHER): Payer: Self-pay | Admitting: Primary Care

## 2018-12-26 ENCOUNTER — Ambulatory Visit (INDEPENDENT_AMBULATORY_CARE_PROVIDER_SITE_OTHER): Payer: Self-pay | Admitting: Primary Care

## 2018-12-26 VITALS — BP 144/94 | HR 64 | Temp 97.5°F | Ht 66.0 in | Wt 203.2 lb

## 2018-12-26 DIAGNOSIS — G44019 Episodic cluster headache, not intractable: Secondary | ICD-10-CM

## 2018-12-26 DIAGNOSIS — Z760 Encounter for issue of repeat prescription: Secondary | ICD-10-CM

## 2018-12-26 DIAGNOSIS — I10 Essential (primary) hypertension: Secondary | ICD-10-CM

## 2018-12-26 DIAGNOSIS — J45909 Unspecified asthma, uncomplicated: Secondary | ICD-10-CM

## 2018-12-26 MED ORDER — BUDESONIDE-FORMOTEROL FUMARATE 160-4.5 MCG/ACT IN AERO: 2.0000 | INHALATION_SPRAY | Freq: Two times a day (BID) | RESPIRATORY_TRACT | 11 refills | Status: DC

## 2018-12-26 MED ORDER — SUMATRIPTAN SUCCINATE 50 MG PO TABS: ORAL_TABLET | ORAL | 1 refills | Status: DC

## 2018-12-26 MED FILL — SUMATRIPTAN SUCC 50 MG TAB: 50 | 30 days supply | Qty: 9 | Fill #0

## 2018-12-26 NOTE — Progress Notes (Signed)
Established Patient Office Visit  Subjective:  Patient ID: Shawn Raymond, male    DOB: [birth year]  Age: 38 y.o. MRN: [id number]  CC:  Chief Complaint  Patient presents with  . Follow-up    blood pressure recheck     HPI Shawn Raymond presents for blood pressure recheck and has not even picked up his medications because his males friends states  It will mess with your "NATURE". Past Medical History:  Diagnosis Date  . Asthma   . Sarcoidosis     Past Surgical History:  Procedure Laterality Date  . ANTERIOR CERVICAL DECOMP/DISCECTOMY FUSION N/A 04/02/2018   Procedure: ANTERIOR CERVICAL DECOMPRESSION/DISCECTOMY FUSION CERVICAL SIX- CERVICAL SEVEN ;  Surgeon: Consuella Lose, MD;  Location: [place];  Service: Neurosurgery;  Laterality: N/A;  . FEMUR FRACTURE SURGERY    . PATELLA RECONSTRUCTION    . POSTERIOR CERVICAL FUSION/FORAMINOTOMY N/A 04/02/2018   Procedure: CERVICAL THREE- CERVICAL FOUR, CERVICAL FOUR- CERVICAL FIVE, CERVICAL FIVE- CERVICAL SIX, CERVICAL SIX- CERVICAL SEVEN LAMINECTOMY WITH LATERAL MASS POSTERIOR SEGMENTAL INSTRUEMNTATION, POSTERIOR LATERAL ARTHRODESIS;  Surgeon: Consuella Lose, MD;  Location: [facility];  Service: Neurosurgery;  Laterality: N/A;    Family History  Problem Relation Age of Onset  . Healthy Mother   . Healthy Father     Social History   Socioeconomic History  . Marital status: Divorced    Spouse name: Not on file  . Number of children: Not on file  . Years of education: Not on file  . Highest education level: Not on file  Occupational History  . Not on file  Social Needs  . Financial resource strain: Not on file  . Food insecurity    Worry: Not on file    Inability: Not on file  . Transportation needs    Medical: Not on file    Non-medical: Not on file  Tobacco Use  . Smoking status: Former Smoker    Packs/day: 0.70    Types: Cigarettes    Quit date: 12/24/2017    Years since quitting: 1.0  . Smokeless tobacco:  Never Used  . Tobacco comment: patient has been  without a cigarette since 12/27/17  Substance and Sexual Activity  . Alcohol use: No  . Drug use: Yes    Types: Marijuana    Comment: 3grams a day.   Marland Kitchen Sexual activity: Not on file  Lifestyle  . Physical activity    Days per week: Not on file    Minutes per session: Not on file  . Stress: Not on file  Relationships  . Social Herbalist on phone: Not on file    Gets together: Not on file    Attends religious service: Not on file    Active member of club or organization: Not on file    Attends meetings of clubs or organizations: Not on file    Relationship status: Not on file  . Intimate partner violence    Fear of current or ex partner: Not on file    Emotionally abused: Not on file    Physically abused: Not on file    Forced sexual activity: Not on file  Other Topics Concern  . Not on file  Social History Narrative  . Not on file    Outpatient Medications Prior to Visit  Medication Sig Dispense Refill  . albuterol (VENTOLIN HFA) 108 (90 Base) MCG/ACT inhaler INHALE 2 PUFFS INTO THE LUNGS EVERY 4 (FOUR) HOURS AS NEEDED FOR WHEEZING OR  SHORTNESS OF BREATH. 18 g 2  . methocarbamol (ROBAXIN) 500 MG tablet Take 1 tablet (500 mg total) by mouth 3 (three) times daily. 60 tablet 2  . omeprazole (PRILOSEC) 40 MG capsule Take 1 capsule (40 mg total) by mouth daily. 30 capsule 2  . budesonide-formoterol (SYMBICORT) 160-4.5 MCG/ACT inhaler Inhale 2 puffs into the lungs 2 (two) times daily. 1 Inhaler 11  . SUMAtriptan (IMITREX) 50 MG tablet TAKE 1 TABLET BY MOUTH AT THE START OF THE HEADACHE. MAY REPEAT IN 2 HOURS IF NO RESPONSE. NO MORE THAN 2 TABLETS IN 24 HOURS 10 tablet 1  . hydrochlorothiazide (HYDRODIURIL) 25 MG tablet Take 1 tablet (25 mg total) by mouth daily. (Patient not taking: Reported on 12/26/2018) 30 tablet 3   No facility-administered medications prior to visit.     Allergies  Allergen Reactions  . Shellfish  Allergy Anaphylaxis    ROS Review of Systems  Respiratory: Positive for shortness of breath and wheezing.   All other systems reviewed and are negative.     Objective:    Physical Exam  Constitutional: He is oriented to person, place, and time. He appears well-developed and well-nourished.  HENT:  Head: Normocephalic.  Eyes: Pupils are equal, round, and reactive to light. EOM are normal.  Neck: Normal range of motion. Neck supple.  Cardiovascular: Normal rate and regular rhythm.  Pulmonary/Chest: Effort normal. He has wheezes.  Upper and lower lobes states he has taken his asthma medication as prescribed  Abdominal: Soft. Bowel sounds are normal.  Musculoskeletal: Normal range of motion.  Neurological: He is alert and oriented to person, place, and time.  Skin: Skin is warm and dry.  Psychiatric: He has a normal mood and affect.    BP (!) 144/94 (BP Location: Left Arm, Patient Position: Sitting, Cuff Size: Large)   Pulse 64   Temp (!) 97.5 F (36.4 C) (Tympanic)   Ht 5\' 6"  (1.676 m)   Wt 203 lb 3.2 oz (92.2 kg)   SpO2 95%   BMI 32.80 kg/m  Wt Readings from Last 3 Encounters:  12/26/18 203 lb 3.2 oz (92.2 kg)  12/05/18 205 lb (93 kg)  06/06/18 199 lb (90.3 kg)     There are no preventive care reminders to display for this patient.  There are no preventive care reminders to display for this patient.  Lab Results  Component Value Date   TSH 2.220 02/08/2018   Lab Results  Component Value Date   WBC 19.8 (H) 04/03/2018   HGB 13.1 04/03/2018   HCT 39.8 04/03/2018   MCV 88.8 04/03/2018   PLT 239 04/03/2018   Lab Results  Component Value Date   NA 136 04/03/2018   K 3.8 04/03/2018   CO2 26 04/03/2018   GLUCOSE 130 (H) 04/03/2018   BUN 8 04/03/2018   CREATININE 1.20 04/03/2018   BILITOT 0.2 02/08/2018   ALKPHOS 115 02/08/2018   AST 21 02/08/2018   ALT 34 02/08/2018   PROT 6.8 02/08/2018   ALBUMIN 4.3 02/08/2018   CALCIUM 9.1 04/03/2018   ANIONGAP 13  04/03/2018   No results found for: CHOL No results found for: HDL No results found for: LDLCALC No results found for: TRIG No results found for: CHOLHDL No results found for: [id number]    Assessment & Plan:  Shawn Raymond was seen today for follow-up.  Diagnoses and all orders for this visit:  Essential hypertension Counseled on blood pressure goal of less than 130/80, low-sodium, DASH diet, medication compliance,  150 minutes of moderate intensity exercise per week. Discussed medication compliance, adverse effects. Patient explained he has not pick up his Bp medications because his friends states it messes with his nature. We discussed the increased risk of stroke or heart attacks. Than he will not even need to worry about his nature working.  Episodic cluster headache, not intractable Signs can be individualized with throbbing pain on any area of the head.  They may come with an aura-dizziness, nausea, sensitive to light sound or smell.  Triggers can be caused by drinking alcohol smoking or certain medications, eating and drinking certain products  This condition may be triggered or caused by: Caffeine, aged cheese, and chocolate.  Refill clinic medication management patient Meds ordered this encounter  Medications  . SUMAtriptan (IMITREX) 50 MG tablet    Sig: TAKE 1 TABLET BY MOUTH AT THE START OF THE HEADACHE. MAY REPEAT IN 2 HOURS IF NO RESPONSE. NO MORE THAN 2 TABLETS IN 24 HOURS    Dispense:  10 tablet    Refill:  1  . budesonide-formoterol (SYMBICORT) 160-4.5 MCG/ACT inhaler    Sig: Inhale 2 puffs into the lungs 2 (two) times daily.    Dispense:  1 Inhaler    Refill:  11   Uncomplicated asthma, unspecified asthma severity, unspecified whether persistent Asthma is recognize by classic presentation of symptoms which include cough, wheeze, and shortness of breath brought on by characteristic triggers and relieved by bronchodilating medications.  This is caused by from hyper  responsiveness with tendency of airways to narrow excessively in response to a stimuli.  Follow-up: Return in about 4 weeks (around 01/23/2019) for Bp ck on medication.    Grayce Sessions, NP

## 2018-12-26 NOTE — Patient Instructions (Signed)
Goal BP:  For patients younger than 60: Goal BP < 130/80  For patients 60 and older: Goal BP < 150/90. For patients with diabetes: Goal BP < 140/90. Your most recent BP: (!) 144/94  mmHg.  Take your medications faithfully as instructed. Maintain a healthy weight. Get at least 150 minutes of aerobic exercise per week. Minimize salt intake. Minimize alcohol intake

## 2019-01-06 MED FILL — METHOCARBAMOL 500 MG TABS: 500 | 20 days supply | Qty: 60 | Fill #1

## 2019-01-06 MED FILL — $Symbicort 160-4.5mcg/act: 160-4.5 | 90 days supply | Qty: 3 | Fill #1

## 2019-01-06 MED FILL — SUMATRIPTAN SUCC 50 MG TAB: 50 | 20 days supply | Qty: 9 | Fill #1

## 2019-01-06 MED FILL — !VENTOLIN HFA INHALER: 108 (90 BAS | 16 days supply | Qty: 18 | Fill #1

## 2019-01-24 ENCOUNTER — Ambulatory Visit (INDEPENDENT_AMBULATORY_CARE_PROVIDER_SITE_OTHER): Payer: Self-pay | Admitting: Primary Care

## 2019-01-24 ENCOUNTER — Encounter (INDEPENDENT_AMBULATORY_CARE_PROVIDER_SITE_OTHER): Payer: Self-pay | Admitting: Primary Care

## 2019-01-24 ENCOUNTER — Other Ambulatory Visit: Payer: Self-pay

## 2019-01-24 VITALS — BP 117/70 | HR 62 | Temp 97.3°F | Ht 66.0 in | Wt 203.0 lb

## 2019-01-24 DIAGNOSIS — J45909 Unspecified asthma, uncomplicated: Secondary | ICD-10-CM

## 2019-01-24 DIAGNOSIS — G44019 Episodic cluster headache, not intractable: Secondary | ICD-10-CM

## 2019-01-24 DIAGNOSIS — G894 Chronic pain syndrome: Secondary | ICD-10-CM

## 2019-01-24 DIAGNOSIS — Z760 Encounter for issue of repeat prescription: Secondary | ICD-10-CM

## 2019-01-24 MED ORDER — ALBUTEROL SULFATE HFA 108 (90 BASE) MCG/ACT IN AERS: INHALATION_SPRAY | RESPIRATORY_TRACT | 2 refills | Status: DC

## 2019-01-24 MED ORDER — BUDESONIDE-FORMOTEROL FUMARATE 160-4.5 MCG/ACT IN AERO: 2.0000 | INHALATION_SPRAY | Freq: Two times a day (BID) | RESPIRATORY_TRACT | 11 refills | Status: DC

## 2019-01-24 MED ORDER — SUMATRIPTAN SUCCINATE 50 MG PO TABS: ORAL_TABLET | ORAL | 1 refills | Status: DC

## 2019-01-24 MED FILL — METHOCARBAMOL 500 MG TABS: 500 | 20 days supply | Qty: 60 | Fill #1

## 2019-01-24 MED FILL — SUMATRIPTAN SUCC 50 MG TAB: 50 | 23 days supply | Qty: 9 | Fill #0

## 2019-01-24 MED FILL — $VENTOLIN HFA 18G INHALER: 108 (90 BAS | 16 days supply | Qty: 18 | Fill #0

## 2019-01-24 MED FILL — $Symbicort 160-4.5mcg/act: 160-4.5 | 30 days supply | Qty: 1 | Fill #0

## 2019-01-24 NOTE — Progress Notes (Signed)
Presents for  hypertension evaluation, on previous visit medication was adjusted to include HCTZ 25mg  daily   Patient treports non adherence with medications. Due to caused issue with his sex drive  Current Medication List Allergies as of 01/24/2019      Reactions   Shellfish Allergy Anaphylaxis      Medication List       Accurate as of January 24, 2019 10:49 AM. If you have any questions, ask your nurse or doctor.        albuterol 108 (90 Base) MCG/ACT inhaler Commonly known as: Ventolin HFA INHALE 2 PUFFS INTO THE LUNGS EVERY 4 (FOUR) HOURS AS NEEDED FOR WHEEZING OR SHORTNESS OF BREATH.   budesonide-formoterol 160-4.5 MCG/ACT inhaler Commonly known as: SYMBICORT Inhale 2 puffs into the lungs 2 (two) times daily.   hydrochlorothiazide 25 MG tablet Commonly known as: HYDRODIURIL Take 1 tablet (25 mg total) by mouth daily. Stopped taking  methocarbamol 500 MG tablet Commonly known as: ROBAXIN Take 1 tablet (500 mg total) by mouth 3 (three) times daily.   omeprazole 40 MG capsule Commonly known as: PRILOSEC Take 1 capsule (40 mg total) by mouth daily.   SUMAtriptan 50 MG tablet Commonly known as: IMITREX TAKE 1 TABLET BY MOUTH AT THE START OF THE HEADACHE. MAY REPEAT IN 2 HOURS IF NO RESPONSE. NO MORE THAN 2 TABLETS IN 24 HOURS      Past Medical History  Past Medical History:  Diagnosis Date  . Asthma   . Sarcoidosis    Dietary habits include: took salt completely out of diet , reading label for sodium content  Exercise habits include: walking 1 mile every day O:  Physical Exam Constitutional:      Appearance: Normal appearance.  HENT:     Head: Normocephalic.  Neck:     Musculoskeletal: Neck rigidity present.  Cardiovascular:     Rate and Rhythm: Normal rate and regular rhythm.  Pulmonary:     Effort: Pulmonary effort is normal.     Breath sounds: Wheezing present.  Musculoskeletal: Normal range of motion.  Skin:    General: Skin is warm.   Neurological:     General: No focal deficit present.     Mental Status: He is alert.     Review of Systems  Musculoskeletal: Positive for back pain and neck pain.  Neurological: Positive for headaches.       Dx of migraines    Last 3 Office BP readings: BP Readings from Last 3 Encounters:  01/24/19 117/70  12/26/18 (!) 144/94  12/05/18 (!) 140/98    BMET    Component Value Date/Time   NA 136 04/03/2018 0532   NA 142 02/08/2018 1123   K 3.8 04/03/2018 0532   CL 97 (L) 04/03/2018 0532   CO2 26 04/03/2018 0532   GLUCOSE 130 (H) 04/03/2018 0532   BUN 8 04/03/2018 0532   BUN 10 02/08/2018 1123   CREATININE 1.20 04/03/2018 0532   CALCIUM 9.1 04/03/2018 0532   GFRNONAA >60 04/03/2018 0532   GFRAA >60 04/03/2018 0532    Renal function: CrCl cannot be calculated (Patient's most recent lab result is older than the maximum 21 days allowed.).  Clinical ASCVD No The ASCVD Risk score 04/05/2018 DC Jr., et al., 2013) failed to calculate for the following reasons:   The 2013 ASCVD risk score is only valid for ages 3 to 24   A/P: Hypertension longstanding/newly diagnosed currently not taking medication . He has reached optimal goal for blood  pressure with dietary modification and exercising.  -F/u labs ordered - CBC, CMP, Lipid for future labs Continue  lifestyle modifications for blood pressure control which he has  reduced dietary sodium, increased exercise, adequate sleep  Trajan was seen today for follow-up.  Diagnoses and all orders for this visit:  Chronic pain syndrome -     Ambulatory referral to Pain Clinic  Uncomplicated asthma, unspecified asthma severity, unspecified whether persistent -     albuterol (VENTOLIN HFA) 108 (90 Base) MCG/ACT inhaler; INHALE 2 PUFFS INTO THE LUNGS EVERY 4 (FOUR) HOURS AS NEEDED FOR WHEEZING OR SHORTNESS OF BREATH. -     budesonide-formoterol (SYMBICORT) 160-4.5 MCG/ACT inhaler; Inhale 2 puffs into the lungs 2 (two) times daily.   Refill clinic medication management patient -     albuterol (VENTOLIN HFA) 108 (90 Base) MCG/ACT inhaler; INHALE 2 PUFFS INTO THE LUNGS EVERY 4 (FOUR) HOURS AS NEEDED FOR WHEEZING OR SHORTNESS OF BREATH. -     budesonide-formoterol (SYMBICORT) 160-4.5 MCG/ACT inhaler; Inhale 2 puffs into the lungs 2 (two) times daily. -     SUMAtriptan (IMITREX) 50 MG tablet; TAKE 1 TABLET BY MOUTH AT THE START OF THE HEADACHE. MAY REPEAT IN 2 HOURS IF NO RESPONSE. NO MORE THAN 2 TABLETS IN 24 HOURS  Episodic cluster headache, not intractable -     SUMAtriptan (IMITREX) 50 MG tablet; TAKE 1 TABLET BY MOUTH AT THE START OF THE HEADACHE. MAY REPEAT IN 2 HOURS IF NO RESPONSE. NO MORE THAN 2 TABLETS IN 24 HOURS

## 2019-01-24 NOTE — Patient Instructions (Signed)

## 2019-02-20 MED FILL — !VENTOLIN HFA INHALER: 108 (90 BAS | 16 days supply | Qty: 18 | Fill #1

## 2019-02-20 MED FILL — METHOCARBAMOL 500 MG TABS: 500 | 20 days supply | Qty: 60 | Fill #2

## 2019-02-20 MED FILL — $Symbicort 160-4.5mcg/act: 160-4.5 | 30 days supply | Qty: 1 | Fill #1

## 2019-02-20 MED FILL — OMEPRAZOLE DR 40 MG CAPSULE: 40 | 30 days supply | Qty: 30 | Fill #1

## 2019-03-25 ENCOUNTER — Ambulatory Visit (INDEPENDENT_AMBULATORY_CARE_PROVIDER_SITE_OTHER): Payer: Self-pay | Admitting: Primary Care

## 2019-03-25 ENCOUNTER — Encounter (INDEPENDENT_AMBULATORY_CARE_PROVIDER_SITE_OTHER): Payer: Self-pay | Admitting: Primary Care

## 2019-03-25 ENCOUNTER — Other Ambulatory Visit: Payer: Self-pay

## 2019-03-25 DIAGNOSIS — A510 Primary genital syphilis: Secondary | ICD-10-CM

## 2019-03-25 NOTE — Progress Notes (Signed)
Virtual Visit via Telephone Note  I connected with Shawn Raymond on 03/25/19 at 11:10 AM EST by telephone and verified that I am speaking with the correct person using two identifiers.   I discussed the limitations, risks, security and privacy concerns of performing an evaluation and management service by telephone and the availability of in person appointments. I also discussed with the patient that there may be a patient responsible charge related to this service. The patient expressed understanding and agreed to proceed.   History of Present Illness: Shawn Raymond is having tele visit for infection with a tooth and he has been using his tongue to feel tooth maybe the cause of 3 bumps appearing on his tongue.    Past Medical History:  Diagnosis Date  . Asthma   . Sarcoidosis    Current Outpatient Medications on File Prior to Visit  Medication Sig Dispense Refill  . albuterol (VENTOLIN HFA) 108 (90 Base) MCG/ACT inhaler INHALE 2 PUFFS INTO THE LUNGS EVERY 4 (FOUR) HOURS AS NEEDED FOR WHEEZING OR SHORTNESS OF BREATH. 18 g 2  . budesonide-formoterol (SYMBICORT) 160-4.5 MCG/ACT inhaler Inhale 2 puffs into the lungs 2 (two) times daily. 1 Inhaler 11  . omeprazole (PRILOSEC) 40 MG capsule Take 1 capsule (40 mg total) by mouth daily. 30 capsule 2  . SUMAtriptan (IMITREX) 50 MG tablet TAKE 1 TABLET BY MOUTH AT THE START OF THE HEADACHE. MAY REPEAT IN 2 HOURS IF NO RESPONSE. NO MORE THAN 2 TABLETS IN 24 HOURS 10 tablet 1  . hydrochlorothiazide (HYDRODIURIL) 25 MG tablet Take 1 tablet (25 mg total) by mouth daily. (Patient not taking: Reported on 01/24/2019) 30 tablet 3   No current facility-administered medications on file prior to visit.   Observations/Objective: Review of Systems  HENT:       Three lesions reported on oral mucosa  All other systems reviewed and are negative.  Assessment and Plan:  Shawn Raymond was seen today for bumps on tongue.  Diagnoses and all orders for  this visit:  [place] - Aphthous ulcer explained to patient this is self limited, and not cancerous. Good oral hygene and gargle with mouth wash/rinse. Avoid irritating foods. In person visit will check labs, iron, U-27, and folic acid if symptoms continues.    Follow Up Instructions:    I discussed the assessment and treatment plan with the patient. The patient was provided an opportunity to ask questions and all were answered. The patient agreed with the plan and demonstrated an understanding of the instructions.   The patient was advised to call back or seek an in-person evaluation if the symptoms worsen or if the condition fails to improve as anticipated.  I provided 13 minutes of non-face-to-face time during this encounter.   Kerin Perna, NP

## 2019-03-31 ENCOUNTER — Other Ambulatory Visit (INDEPENDENT_AMBULATORY_CARE_PROVIDER_SITE_OTHER): Payer: Self-pay | Admitting: Primary Care

## 2019-03-31 DIAGNOSIS — M5412 Radiculopathy, cervical region: Secondary | ICD-10-CM

## 2019-03-31 DIAGNOSIS — Z760 Encounter for issue of repeat prescription: Secondary | ICD-10-CM

## 2019-03-31 MED FILL — ALBUTEROL SULFATE HFA 108 (: 108 (90 BAS | 16 days supply | Qty: 18 | Fill #2

## 2019-03-31 MED FILL — OMEPRAZOLE DR 40 MG CAPSULE: 40 | 30 days supply | Qty: 30 | Fill #2

## 2019-03-31 MED FILL — $Symbicort 160-4.5mcg/act: 160-4.5 | 30 days supply | Qty: 1 | Fill #2

## 2019-03-31 MED FILL — SUMATRIPTAN SUCC 50 MG TAB: 50 | 23 days supply | Qty: 9 | Fill #1

## 2019-03-31 NOTE — Telephone Encounter (Signed)
Sent to PCP ?

## 2019-04-03 MED FILL — METHOCARBAMOL 500 MG TABS: 500 | 20 days supply | Qty: 60 | Fill #0

## 2019-04-04 ENCOUNTER — Emergency Department (HOSPITAL_COMMUNITY)
Admission: EM | Admit: 2019-04-04 | Discharge: 2019-04-04 | Disposition: A | Discharge status: Home / Self Care | Payer: Self-pay | Attending: Emergency Medicine | Admitting: Emergency Medicine

## 2019-04-04 ENCOUNTER — Other Ambulatory Visit: Payer: Self-pay

## 2019-04-04 ENCOUNTER — Encounter (HOSPITAL_COMMUNITY): Payer: Self-pay | Admitting: Student

## 2019-04-04 ENCOUNTER — Emergency Department (HOSPITAL_COMMUNITY): Payer: Self-pay

## 2019-04-04 DIAGNOSIS — Z87891 Personal history of nicotine dependence: Secondary | ICD-10-CM | POA: Insufficient documentation

## 2019-04-04 DIAGNOSIS — F1210 Cannabis abuse, uncomplicated: Secondary | ICD-10-CM | POA: Insufficient documentation

## 2019-04-04 DIAGNOSIS — Z79899 Other long term (current) drug therapy: Secondary | ICD-10-CM | POA: Insufficient documentation

## 2019-04-04 DIAGNOSIS — Z20828 Contact with and (suspected) exposure to other viral communicable diseases: Secondary | ICD-10-CM | POA: Insufficient documentation

## 2019-04-04 DIAGNOSIS — J45901 Unspecified asthma with (acute) exacerbation: Secondary | ICD-10-CM | POA: Insufficient documentation

## 2019-04-04 LAB — CBC WITH DIFFERENTIAL/PLATELET
Abs Immature Granulocytes: 0.07 10*3/uL (ref 0.00–0.07)
Basophils Absolute: 0.1 10*3/uL (ref 0.0–0.1)
Basophils Relative: 0 %
Eosinophils Absolute: 0.1 10*3/uL (ref 0.0–0.5)
Eosinophils Relative: 0 %
HCT: 43.1 % (ref 39.0–52.0)
Hemoglobin: 13.9 g/dL (ref 13.0–17.0)
Immature Granulocytes: 1 %
Lymphocytes Relative: 11 %
Lymphs Abs: 1.5 10*3/uL (ref 0.7–4.0)
MCH: 29 pg (ref 26.0–34.0)
MCHC: 32.3 g/dL (ref 30.0–36.0)
MCV: 89.8 fL (ref 80.0–100.0)
Monocytes Absolute: 0.7 10*3/uL (ref 0.1–1.0)
Monocytes Relative: 5 %
Neutro Abs: 11.1 10*3/uL — ABNORMAL HIGH (ref 1.7–7.7)
Neutrophils Relative %: 83 %
Platelets: 255 10*3/uL (ref 150–400)
RBC: 4.8 MIL/uL (ref 4.22–5.81)
RDW: 14.7 % (ref 11.5–15.5)
WBC: 13.4 10*3/uL — ABNORMAL HIGH (ref 4.0–10.5)
nRBC: 0 % (ref 0.0–0.2)

## 2019-04-04 LAB — COMPREHENSIVE METABOLIC PANEL
ALT: 31 U/L (ref 0–44)
AST: 20 U/L (ref 15–41)
Albumin: 4.2 g/dL (ref 3.5–5.0)
Alkaline Phosphatase: 108 U/L (ref 38–126)
Anion gap: 12 (ref 5–15)
BUN: 12 mg/dL (ref 6–20)
CO2: 26 mmol/L (ref 22–32)
Calcium: 9.4 mg/dL (ref 8.9–10.3)
Chloride: 101 mmol/L (ref 98–111)
Creatinine, Ser: 1.02 mg/dL (ref 0.61–1.24)
GFR calc Af Amer: >60 mL/min (ref >60)
GFR calc non Af Amer: >60 mL/min (ref >60)
Glucose, Bld: 100 mg/dL — ABNORMAL HIGH (ref 70–99)
Potassium: 3.8 mmol/L (ref 3.5–5.1)
Sodium: 139 mmol/L (ref 135–145)
Total Bilirubin: 0.7 mg/dL (ref 0.3–1.2)
Total Protein: 7.7 g/dL (ref 6.5–8.1)

## 2019-04-04 LAB — POC SARS CORONAVIRUS 2 AG -  ED: SARS Coronavirus 2 Ag: NEGATIVE

## 2019-04-04 LAB — RESPIRATORY PANEL BY RT PCR (FLU A&B, COVID)
Influenza A by PCR: NEGATIVE
Influenza B by PCR: NEGATIVE
SARS Coronavirus 2 by RT PCR: NEGATIVE

## 2019-04-04 MED ORDER — MAGNESIUM SULFATE 2 GM/50ML IV SOLN
2.0000 g | Freq: Once | INTRAVENOUS | Status: AC
  Administered 2019-04-04: 2 g via INTRAVENOUS
  Filled 2019-04-04: qty 50

## 2019-04-04 MED ORDER — ALBUTEROL SULFATE (5 MG/ML) 0.5% IN NEBU: 2.5000 mg | INHALATION_SOLUTION | RESPIRATORY_TRACT | 0 refills | Status: DC | PRN

## 2019-04-04 MED ORDER — IPRATROPIUM BROMIDE HFA 17 MCG/ACT IN AERS: 2.0000 | INHALATION_SPRAY | Freq: Once | RESPIRATORY_TRACT | Status: DC

## 2019-04-04 MED ORDER — AEROCHAMBER PLUS FLO-VU MEDIUM MISC
1.0000 | Freq: Once | Status: AC
  Administered 2019-04-04: 1
  Filled 2019-04-04: qty 1

## 2019-04-04 MED ORDER — PREDNISONE 10 MG (21) PO TBPK: ORAL_TABLET | Freq: Every day | ORAL | 0 refills | Status: DC

## 2019-04-04 MED ORDER — METHYLPREDNISOLONE SODIUM SUCC 125 MG IJ SOLR
125.0000 mg | Freq: Once | INTRAMUSCULAR | Status: AC
  Administered 2019-04-04: 125 mg via INTRAVENOUS
  Filled 2019-04-04: qty 2

## 2019-04-04 MED ORDER — IPRATROPIUM BROMIDE 0.02 % IN SOLN
0.5000 mg | Freq: Once | RESPIRATORY_TRACT | Status: AC
  Administered 2019-04-04: 0.5 mg via RESPIRATORY_TRACT
  Filled 2019-04-04: qty 2.5

## 2019-04-04 MED ORDER — ALBUTEROL (5 MG/ML) CONTINUOUS INHALATION SOLN
10.0000 mg/h | INHALATION_SOLUTION | Freq: Once | RESPIRATORY_TRACT | Status: AC
  Administered 2019-04-04: 10 mg/h via RESPIRATORY_TRACT
  Filled 2019-04-04: qty 20

## 2019-04-04 MED ORDER — SODIUM CHLORIDE 0.9 % IV BOLUS
1000.0000 mL | Freq: Once | INTRAVENOUS | Status: AC
  Administered 2019-04-04: 1000 mL via INTRAVENOUS

## 2019-04-04 MED ORDER — ALBUTEROL SULFATE HFA 108 (90 BASE) MCG/ACT IN AERS
6.0000 | INHALATION_SPRAY | Freq: Once | RESPIRATORY_TRACT | Status: AC
  Administered 2019-04-04: 6 via RESPIRATORY_TRACT
  Filled 2019-04-04: qty 6.7

## 2019-04-04 MED FILL — predniSONE 10 MG TABS: 10 | 7 days supply | Qty: 21 | Fill #0

## 2019-04-04 MED FILL — ALBUTEROL 2.5 MG/0.5 ML SOL: 2.5 | 30 days supply | Qty: 90 | Fill #0

## 2019-04-04 NOTE — ED Triage Notes (Signed)
Pt reports that he woke up this morning having asthma attack. Pt repotrs coughing and wheezing. Using symbicort and albuterol. EMS gave neb enroute. Pt states he was with a friend Saturday that tested positive for covid on tuesday

## 2019-04-04 NOTE — ED Notes (Signed)
Patient ambulated in room on room air. Baseline O2 sitting 99% on room air. With ambulation, sats stayed between 96-98% on room air. No c/o shortness of breath or dizziness. Gait stable.

## 2019-04-04 NOTE — ED Notes (Signed)
Respiratory called and informed of neb treatments.

## 2019-04-04 NOTE — ED Provider Notes (Signed)
[facility] EMERGENCY DEPARTMENT Provider Note   CSN: [id number] Arrival date & time: 04/04/19  0710     History Chief Complaint  Patient presents with  . Asthma    Shawn Raymond is a 38 y.o. male with a history of asthma, sarcoidosis, & former tobacco use who presents to the ED via EMS for dyspnea & wheezing for the past 5 days which acutely worsened this AM. Patient states he has had nasal congestion, cough productive of clear sputum, dyspnea, & wheezing. He has been using his albuterol inhaler & Symbicort inhaler without much relief of his sxs. States that warm temperatures and exertion makes his dyspnea worse. This AM he woke up feeling much worse with what he describes as an asthma attack. He called 911, he was given 1 neb treatment en route by EMS with some improvement but remains with sxs. He was with a friend 03/30/19 watching a football game indoors who tested positive for covid 04/01/19 for an asymptomatic screening test for work. Patient denies fever, chills, ear pain, sore throat, chest pain, abdominal pain, nausea, vomiting, diarrhea,  leg pain/swelling, hemoptysis, recent surgery/trauma, recent long travel, hormone use, personal hx of cancer, or hx of DVT/PE.   He required admission for his asthma as a child, but no admissions in adulthood. No prior intubations required.   HPI     Past Medical History:  Diagnosis Date  . Asthma   . Sarcoidosis     Patient Active Problem List   Diagnosis Date Noted  . Stenosis of cervical spine with myelopathy (HCC) 04/02/2018  . Cervical disc disorder with radiculopathy of cervical region 01/25/2018  . Spinal stenosis in cervical region 01/25/2018  . Cervicalgia 01/25/2018  . Heartburn 01/25/2018  . FRACTURE, ANKLE, RIGHT 08/09/2009  . ANKLE SPRAIN, RIGHT 08/09/2009  . COCAINE ABUSE 08/03/2009  . TOBACCO ABUSE 08/02/2009  . 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 INTRINSIC ASTHMA, UNSPECIFIED 03/23/2009  . Nonspecific  (abnormal) findings on radiological and other examination of body structure 01/07/2009  . CT, CHEST, ABNORMAL 01/07/2009    Past Surgical History:  Procedure Laterality Date  . ANTERIOR CERVICAL DECOMP/DISCECTOMY FUSION N/A 04/02/2018   Procedure: ANTERIOR CERVICAL DECOMPRESSION/DISCECTOMY FUSION CERVICAL SIX- CERVICAL SEVEN ;  Surgeon: Lisbeth RenshawNundkumar, Neelesh, MD;  Location: [facility] OR;  Service: Neurosurgery;  Laterality: N/A;  . FEMUR FRACTURE SURGERY    . PATELLA RECONSTRUCTION    . POSTERIOR CERVICAL FUSION/FORAMINOTOMY N/A 04/02/2018   Procedure: CERVICAL THREE- CERVICAL FOUR, CERVICAL FOUR- CERVICAL FIVE, CERVICAL FIVE- CERVICAL SIX, CERVICAL SIX- CERVICAL SEVEN LAMINECTOMY WITH LATERAL MASS POSTERIOR SEGMENTAL INSTRUEMNTATION, POSTERIOR LATERAL ARTHRODESIS;  Surgeon: Lisbeth RenshawNundkumar, Neelesh, MD;  Location: [facility] OR;  Service: Neurosurgery;  Laterality: N/A;       Family History  Problem Relation Age of Onset  . Healthy Mother   . Healthy Father     Social History   Tobacco Use  . Smoking status: Former Smoker    Packs/day: 0.70    Types: Cigarettes    Quit date: 12/24/2017    Years since quitting: 1.2  . Smokeless tobacco: Never Used  . Tobacco comment: patient has been  without a cigarette since 12/27/17  Substance Use Topics  . Alcohol use: No  . Drug use: Yes    Types: Marijuana    Comment: 3grams a day.     Home Medications Prior to Admission medications   Medication Sig Start Date End Date Taking? Authorizing Provider  albuterol (VENTOLIN HFA) 108 (90 Base) MCG/ACT inhaler INHALE 2  PUFFS INTO THE LUNGS EVERY 4 (FOUR) HOURS AS NEEDED FOR WHEEZING OR SHORTNESS OF BREATH. 01/24/19   Grayce Sessions, NP  budesonide-formoterol [facility]) 160-4.5 MCG/ACT inhaler Inhale 2 puffs into the lungs 2 (two) times daily. 01/24/19   Grayce Sessions, NP  hydrochlorothiazide (HYDRODIURIL) 25 MG tablet Take 1 tablet (25 mg total) by mouth daily. Patient not taking: Reported on 01/24/2019  12/05/18   Grayce Sessions, NP  methocarbamol (ROBAXIN) 500 MG tablet TAKE 1 TABLET (500 MG TOTAL) BY MOUTH 3 (THREE) TIMES DAILY. 04/03/19   Grayce Sessions, NP  omeprazole (PRILOSEC) 40 MG capsule Take 1 capsule (40 mg total) by mouth daily. 12/05/18   Grayce Sessions, NP  SUMAtriptan (IMITREX) 50 MG tablet TAKE 1 TABLET BY MOUTH AT THE START OF THE HEADACHE. MAY REPEAT IN 2 HOURS IF NO RESPONSE. NO MORE THAN 2 TABLETS IN 24 HOURS 01/24/19   Grayce Sessions, NP    Allergies    Shellfish allergy  Review of Systems   Review of Systems  Constitutional: Negative for chills and fever.  HENT: Positive for congestion. Negative for ear pain and sore throat.   Respiratory: Positive for cough, shortness of breath and wheezing.   Cardiovascular: Negative for chest pain and leg swelling.  Gastrointestinal: Negative for abdominal pain, diarrhea, nausea and vomiting.  Musculoskeletal: Negative for myalgias.  Neurological: Negative for syncope.  All other systems reviewed and are negative.   Physical Exam Updated Vital Signs BP (!) 148/96 (BP Location: Left Arm)   Pulse 80   Temp 97.8 F (36.6 C) (Oral)   Resp 18   Ht 5\' 6"  (1.676 m)   Wt 93.9 kg   SpO2 100%   BMI 33.41 kg/m   Physical Exam Vitals and nursing note reviewed.  Constitutional:      General: He is not in acute distress.    Appearance: He is well-developed. He is not toxic-appearing.  HENT:     Head: Normocephalic and atraumatic.     Right Ear: Ear canal normal. Tympanic membrane is not perforated, erythematous, retracted or bulging.     Left Ear: Ear canal normal. Tympanic membrane is not perforated, erythematous, retracted or bulging.     Ears:     Comments: No mastoid erythema/swellng/tenderness.     Nose: Congestion present.     Right Sinus: No maxillary sinus tenderness or frontal sinus tenderness.     Left Sinus: No maxillary sinus tenderness or frontal sinus tenderness.     Mouth/Throat:      Pharynx: Oropharynx is clear. Uvula midline. No oropharyngeal exudate or posterior oropharyngeal erythema.     Comments: Posterior oropharynx is symmetric appearing. Patient tolerating own secretions without difficulty. No trismus. No drooling. No hot potato voice. No swelling beneath the tongue, submandibular compartment is soft.  Eyes:     General:        Right eye: No discharge.        Left eye: No discharge.     Conjunctiva/sclera: Conjunctivae normal.  Cardiovascular:     Rate and Rhythm: Normal rate and regular rhythm.  Pulmonary:     Effort: No respiratory distress.     Breath sounds: Wheezing (biphasic throughout) present.     Comments: Mildly tachypneic at times throughout assessment.  Abdominal:     General: There is no distension.     Palpations: Abdomen is soft.     Tenderness: There is no abdominal tenderness.  Musculoskeletal:     Cervical  back: Neck supple. No rigidity.     Right lower leg: No edema.     Left lower leg: No edema.  Lymphadenopathy:     Cervical: No cervical adenopathy.  Skin:    General: Skin is warm and dry.     Findings: No rash.  Neurological:     Mental Status: He is alert.  Psychiatric:        Behavior: Behavior normal.     ED Results / Procedures / Treatments   Labs (all labs ordered are listed, but only abnormal results are displayed) Labs Reviewed  COMPREHENSIVE METABOLIC PANEL - Abnormal; Notable for the following components:      Result Value   Glucose, Bld 100 (*)    All other components within normal limits  CBC WITH DIFFERENTIAL/PLATELET - Abnormal; Notable for the following components:   WBC 13.4 (*)    Neutro Abs 11.1 (*)    All other components within normal limits  RESPIRATORY PANEL BY RT PCR (FLU A&B, COVID)  POC SARS CORONAVIRUS 2 AG -  ED    EKG EKG Interpretation  Date/Time:  Friday April 04 2019 07:40:29 EST Ventricular Rate:  72 PR Interval:    QRS Duration: 86 QT Interval:  378 QTC Calculation: 414 R  Axis:   82 Text Interpretation: Sinus rhythm Left ventricular hypertrophy Confirmed by Elnora Morrison [phone]) on 04/04/2019 11:58:44 AM   Radiology DG Chest Port 1 View  Result Date: 04/04/2019 CLINICAL DATA:  Cough and wheezing.  Shortness of breath EXAM: PORTABLE CHEST 1 VIEW COMPARISON:  January 11, 2016 FINDINGS: Lungs are clear. Heart size and pulmonary vascularity are normal. No adenopathy. Postoperative changes noted in the lower cervical spine region. IMPRESSION: No edema or consolidation.  No adenopathy. Electronically Signed   By: Lowella Grip III M.D.   On: 04/04/2019 08:18    Procedures .Critical Care Performed by: Amaryllis Dyke, PA-C Authorized by: Amaryllis Dyke, PA-C    CRITICAL CARE Performed by: Kennith Maes   Total critical care time: 30 minutes for asthma exacerbation requiring continuous neb  Critical care time was exclusive of separately billable procedures and treating other patients.  Critical care was necessary to treat or prevent imminent or life-threatening deterioration.  Critical care was time spent personally by me on the following activities: development of treatment plan with patient and/or surrogate as well as nursing, discussions with consultants, evaluation of patient's response to treatment, examination of patient, obtaining history from patient or surrogate, ordering and performing treatments and interventions, ordering and review of laboratory studies, ordering and review of radiographic studies, pulse oximetry and re-evaluation of patient's condition.    (including critical care time)  Medications Ordered in ED Medications  methylPREDNISolone sodium succinate (SOLU-MEDROL) 125 mg/2 mL injection 125 mg (125 mg Intravenous Given 04/04/19 0851)  magnesium sulfate IVPB 2 g 50 mL (0 g Intravenous Stopped 04/04/19 0959)  albuterol (VENTOLIN HFA) 108 (90 Base) MCG/ACT inhaler 6 puff (6 puffs Inhalation Given 04/04/19  0853)  AeroChamber Plus Flo-Vu Medium MISC 1 each (1 each Other Given 04/04/19 0854)  sodium chloride 0.9 % bolus 1,000 mL (0 mLs Intravenous Stopped 04/04/19 0955)  ipratropium (ATROVENT) nebulizer solution 0.5 mg (0.5 mg Nebulization Given 04/04/19 1035)  albuterol (PROVENTIL,VENTOLIN) solution continuous neb (10 mg/hr Nebulization Given 04/04/19 1035)    ED Course  I have reviewed the triage vital signs and the nursing notes.  Pertinent labs & imaging results that were available during my care of the patient were  reviewed by me and considered in my medical decision making (see chart for details).    MDM Rules/Calculators/A&P                      Patient with history of asthma & pulmonary sarcoidosis presents with complaints of respiratory sxs x 5 days, acutely worsened this AM, some relief s/p neb tx en route. He is intermittently mildly tachypneic on my assesment with biphasic wheezing throughout all lung fields, however does not appear to be in respiratory distress. Exam otherwise benign. Rapid covid antigen test negative, however given his very recent exposure and sxs will obtain PCR rapid test prior to nebulizer treatments in the emergency department. Plan for labs, fluids, albuterol/atroven inhaler, solumedrol, & mag. If covid negative will likely administer continuous neb treatment.   CBC: Mild leukocytosis @ 13.4. No anemia.  CMP: No electrolyte derangements.  COVID & influenza negative.  CXR: No edema or consolidation.  No adenopathy EKG: No STEMI  PERC negative- doubt PE No infiltrate on CXR to suggest pneumonia.  No findings of fluid overload on CXR.  EKG without ischemic changes.  COVID & influenza negative.   Administered atrovent neb with 1 hour continuous albuterol neb  11:30: RE-EVAL: Patient remains with biphasic wheezing on exam, he states he is feeling much better and would like to go home. Ambulatory SpO2 maintained 96-98%. He does still have some wheezing but  given his symptomatic improvement and lack of hypoxia I feel it is reasonable to discharge him home with a steroid taper, PCP follow-up, and strict return precautions.  I discussed results, treatment plan, need for follow-up, and return precautions with the patient. Provided opportunity for questions, patient confirmed understanding and is in agreement with plan.   This is a shared visit with supervising physician Dr. Jodi Mourning who has independently evaluated patient & provided guidance in evaluation/management/disposition, in agreement with care   Shawn Raymond was evaluated in Emergency Department on 04/04/2019 for the symptoms described in the history of present illness. He/she was evaluated in the context of the global COVID-19 pandemic, which necessitated consideration that the patient might be at risk for infection with the SARS-CoV-2 virus that causes COVID-19. Institutional protocols and algorithms that pertain to the evaluation of patients at risk for COVID-19 are in a state of rapid change based on information released by regulatory bodies including the CDC and federal and state organizations. These policies and algorithms were followed during the patient's care in the ED.  Final Clinical Impression(s) / ED Diagnoses Final diagnoses:  Exacerbation of asthma, unspecified asthma severity, unspecified whether persistent    Rx / DC Orders ED Discharge Orders         Ordered    predniSONE (STERAPRED UNI-PAK 21 TAB) 10 MG (21) TBPK tablet  Daily,   Status:  Discontinued     04/04/19 1222    albuterol (PROVENTIL) (5 MG/ML) 0.5% nebulizer solution  Every 4 hours PRN,   Status:  Discontinued     04/04/19 1222    albuterol (PROVENTIL) (5 MG/ML) 0.5% nebulizer solution  Every 4 hours PRN     04/04/19 1242    predniSONE (STERAPRED UNI-PAK 21 TAB) 10 MG (21) TBPK tablet  Daily     04/04/19 1242           Azad Calame, [place], PA-C 04/04/19 1243    Blane Ohara, MD 04/05/19  [phone]

## 2019-04-04 NOTE — Discharge Instructions (Addendum)
You were seen in the emergency department today for an asthma exacerbation.  Your labs and chest x-ray as well as your EKG were overall reassuring.  We are sending you home with a steroid taper, prednisone, as well as albuterol nebulizer solution.  Please take the steroid as prescribed.  Please use the nebulizer albuterol or your albuterol inhaler every 4-6 hours for the next 24 hours then as needed every 4-6 hours.  We have prescribed you new medication(s) today. Discuss the medications prescribed today with your pharmacist as they can have adverse effects and interactions with your other medicines including over the counter and prescribed medications. Seek medical evaluation if you start to experience new or abnormal symptoms after taking one of these medicines, seek care immediately if you start to experience difficulty breathing, feeling of your throat closing, facial swelling, or rash as these could be indications of a more serious allergic reaction   Please follow-up with your primary care provider within 3 days.  Return to the emergency department for new or worsening symptoms including but not limited to trouble breathing, chest pain, passing out, fever, or any other concerns.

## 2019-04-09 MED FILL — $Symbicort 160-4.5mcg/act: 160-4.5 | 30 days supply | Qty: 1 | Fill #3

## 2019-04-09 MED FILL — ALBUTEROL SULFATE HFA 108 (: 108 (90 BAS | 16 days supply | Qty: 18 | Fill #1

## 2019-04-28 ENCOUNTER — Other Ambulatory Visit (INDEPENDENT_AMBULATORY_CARE_PROVIDER_SITE_OTHER): Payer: Self-pay | Admitting: Primary Care

## 2019-04-28 DIAGNOSIS — R12 Heartburn: Secondary | ICD-10-CM

## 2019-04-28 DIAGNOSIS — Z760 Encounter for issue of repeat prescription: Secondary | ICD-10-CM

## 2019-04-28 MED FILL — SUMATRIPTAN SUCC 50 MG TAB: 50 | 5 days supply | Qty: 2 | Fill #2

## 2019-04-28 MED FILL — ALBUTEROL SULFATE HFA 108 (: 108 (90 BAS | 16 days supply | Qty: 18 | Fill #2

## 2019-04-28 MED FILL — ALBUTEROL 2.5 MG/0.5 ML SOL: 2.5 | 10 days supply | Qty: 30 | Fill #1

## 2019-04-28 MED FILL — METHOCARBAMOL 500 MG TABS: 500 | 20 days supply | Qty: 60 | Fill #1

## 2019-04-28 NOTE — Telephone Encounter (Signed)
FWD to PCP

## 2019-04-29 MED FILL — OMEPRAZOLE DR 40 MG CAPSULE: 40 | 30 days supply | Qty: 30 | Fill #0

## 2019-05-08 ENCOUNTER — Other Ambulatory Visit: Payer: Self-pay

## 2019-05-08 ENCOUNTER — Ambulatory Visit (INDEPENDENT_AMBULATORY_CARE_PROVIDER_SITE_OTHER): Payer: Self-pay | Admitting: Primary Care

## 2019-05-08 ENCOUNTER — Encounter (INDEPENDENT_AMBULATORY_CARE_PROVIDER_SITE_OTHER): Payer: Self-pay | Admitting: Primary Care

## 2019-05-08 DIAGNOSIS — M1990 Unspecified osteoarthritis, unspecified site: Secondary | ICD-10-CM

## 2019-05-08 MED ORDER — MELOXICAM 15 MG PO TABS: 15.0000 mg | ORAL_TABLET | Freq: Every day | ORAL | 1 refills | Status: DC

## 2019-05-08 MED FILL — MELOXICAM 15 MG TABLET: 15 | 30 days supply | Qty: 30 | Fill #0

## 2019-05-08 NOTE — Progress Notes (Signed)
Virtual Visit via Telephone Note  I connected with Shawn Raymond on 05/08/19 at  2:10 PM EST by telephone and verified that I am speaking with the correct person using two identifiers.   I discussed the limitations, risks, security and privacy concerns of performing an evaluation and management service by telephone and the availability of in person appointments. I also discussed with the patient that there may be a patient responsible charge related to this service. The patient expressed understanding and agreed to proceed.   History of Present Illness: Shawn Raymond is having a tele visit for pain in joints wrist and wrist. Requesting medication to help with the pain. Difficulty grasping having problems with hands/fingers.He denies another other problems or concerns.  Past Medical History:  Diagnosis Date  . Asthma   . Sarcoidosis    Current Outpatient Medications on File Prior to Visit  Medication Sig Dispense Refill  . albuterol (PROVENTIL) (5 MG/ML) 0.5% nebulizer solution Take 0.5 mLs (2.5 mg total) by nebulization every 4 (four) hours as needed for wheezing or shortness of breath. 20 mL 0  . albuterol (VENTOLIN HFA) 108 (90 Base) MCG/ACT inhaler INHALE 2 PUFFS INTO THE LUNGS EVERY 4 (FOUR) HOURS AS NEEDED FOR WHEEZING OR SHORTNESS OF BREATH. 18 g 2  . budesonide-formoterol (SYMBICORT) 160-4.5 MCG/ACT inhaler Inhale 2 puffs into the lungs 2 (two) times daily. 1 Inhaler 11  . methocarbamol (ROBAXIN) 500 MG tablet TAKE 1 TABLET (500 MG TOTAL) BY MOUTH 3 (THREE) TIMES DAILY. 60 tablet 2  . omeprazole (PRILOSEC) 40 MG capsule TAKE 1 CAPSULE (40 MG TOTAL) BY MOUTH DAILY. 30 capsule 2  . SUMAtriptan (IMITREX) 50 MG tablet TAKE 1 TABLET BY MOUTH AT THE START OF THE HEADACHE. MAY REPEAT IN 2 HOURS IF NO RESPONSE. NO MORE THAN 2 TABLETS IN 24 HOURS 10 tablet 1  . hydrochlorothiazide (HYDRODIURIL) 25 MG tablet Take 1 tablet (25 mg total) by mouth daily. (Patient not taking: Reported  on 01/24/2019) 30 tablet 3   No current facility-administered medications on file prior to visit.      Observations/Objective: Review of Systems  Musculoskeletal: Positive for joint pain and neck pain.       Hands finger decrease usage     Assessment and Plan: Lenis was seen today for arthritis.   Diagnoses and all orders for this visit:  Arthritis  May alternate with heat and ice application for pain relief. May also alternate with acetaminophen and Ibuprofen as prescribed pain relief. Other alternatives include massage, acupuncture and water aerobics.  You must stay active and avoid a sedentary lifestyle. Will refill mobic as requested   Other orders -     meloxicam (MOBIC) 15 MG tablet; Take 1 tablet (15 mg total) by mouth daily.    Follow Up Instructions:    I discussed the assessment and treatment plan with the patient. The patient was provided an opportunity to ask questions and all were answered. The patient agreed with the plan and demonstrated an understanding of the instructions.   The patient was advised to call back or seek an in-person evaluation if the symptoms worsen or if the condition fails to improve as anticipated.  I provided 8 minutes of non-face-to-face time during this encounter.   Grayce Sessions, NP

## 2019-05-08 NOTE — Progress Notes (Signed)
Pt was on medication for arthritis prior to surgery; needs to be placed back on it  Can barely hold things in his hands, hands burn and ache all day

## 2019-05-26 MED FILL — OMEPRAZOLE DR 40 MG CAPSULE: 40 | 30 days supply | Qty: 30 | Fill #1

## 2019-05-26 MED FILL — !VENTOLIN HFA INHALER: 108 (90 BAS | 25 days supply | Qty: 18 | Fill #2

## 2019-05-26 MED FILL — $Symbicort 160-4.5mcg/act: 160-4.5 | 30 days supply | Qty: 1 | Fill #4

## 2019-05-27 MED FILL — SUMATRIPTAN SUCC 50 MG TAB: 50 | 30 days supply | Qty: 9 | Fill #0

## 2019-05-27 MED FILL — METHOCARBAMOL 500 MG TABS: 500 | 20 days supply | Qty: 60 | Fill #2

## 2019-06-13 ENCOUNTER — Other Ambulatory Visit: Payer: Self-pay | Admitting: Primary Care

## 2019-06-13 DIAGNOSIS — Z760 Encounter for issue of repeat prescription: Secondary | ICD-10-CM

## 2019-06-13 DIAGNOSIS — J45909 Unspecified asthma, uncomplicated: Secondary | ICD-10-CM

## 2019-06-13 MED FILL — MELOXICAM 15 MG TABLET: 15 | 30 days supply | Qty: 30 | Fill #1

## 2019-06-13 NOTE — Telephone Encounter (Signed)
Sent to PCP ?

## 2019-06-16 DIAGNOSIS — R52 Pain, unspecified: Secondary | ICD-10-CM | POA: Insufficient documentation

## 2019-06-16 MED FILL — ALBUTEROL SULFATE HFA 108 (: 108 (90 BAS | 25 days supply | Qty: 18 | Fill #0

## 2019-06-27 MED FILL — SYMBICORT 160-4.5 MCG INH: 160-4.5 | 30 days supply | Qty: 10 | Fill #5

## 2019-06-27 MED FILL — SUMATRIPTAN SUCC 50 MG TAB: 50 | 30 days supply | Qty: 9 | Fill #1

## 2019-07-01 MED FILL — OMEPRAZOLE DR 40 MG CAPSULE: 40 | 30 days supply | Qty: 30 | Fill #2

## 2019-07-09 ENCOUNTER — Other Ambulatory Visit (INDEPENDENT_AMBULATORY_CARE_PROVIDER_SITE_OTHER): Payer: Self-pay | Admitting: Primary Care

## 2019-07-09 DIAGNOSIS — Z760 Encounter for issue of repeat prescription: Secondary | ICD-10-CM

## 2019-07-09 DIAGNOSIS — M5412 Radiculopathy, cervical region: Secondary | ICD-10-CM

## 2019-07-09 MED FILL — MELOXICAM 15 MG TABLET: 15 | 30 days supply | Qty: 30 | Fill #2

## 2019-07-09 MED FILL — ALBUTEROL SULFATE HFA 108 (: 108 (90 BAS | 25 days supply | Qty: 18 | Fill #1

## 2019-07-09 MED FILL — ALBUTEROL 2.5 MG/0.5 ML SOL: 2.5 | 10 days supply | Qty: 30 | Fill #2

## 2019-07-09 MED FILL — METHOCARBAMOL 500 MG TABS: 500 | 20 days supply | Qty: 60 | Fill #0

## 2019-08-01 ENCOUNTER — Other Ambulatory Visit (INDEPENDENT_AMBULATORY_CARE_PROVIDER_SITE_OTHER): Payer: Self-pay | Admitting: Primary Care

## 2019-08-01 DIAGNOSIS — Z760 Encounter for issue of repeat prescription: Secondary | ICD-10-CM

## 2019-08-01 DIAGNOSIS — R12 Heartburn: Secondary | ICD-10-CM

## 2019-08-01 MED FILL — MELOXICAM 15 MG TABLET: 15 | 30 days supply | Qty: 30 | Fill #3

## 2019-08-01 MED FILL — ALBUTEROL SULFATE HFA 108 (: 108 (90 BAS | 25 days supply | Qty: 18 | Fill #2

## 2019-08-01 MED FILL — OMEPRAZOLE DR 40 MG CAPSULE: 40 | 30 days supply | Qty: 30 | Fill #0

## 2019-08-01 MED FILL — METHOCARBAMOL 500 MG TABS: 500 | 20 days supply | Qty: 60 | Fill #1

## 2019-08-01 MED FILL — SYMBICORT 160-4.5 MCG INH: 160-4.5 | 30 days supply | Qty: 10 | Fill #6

## 2019-09-01 ENCOUNTER — Other Ambulatory Visit: Payer: Self-pay | Admitting: Primary Care

## 2019-09-01 ENCOUNTER — Other Ambulatory Visit: Payer: Self-pay

## 2019-09-01 ENCOUNTER — Encounter (INDEPENDENT_AMBULATORY_CARE_PROVIDER_SITE_OTHER): Payer: Self-pay | Admitting: Primary Care

## 2019-09-01 ENCOUNTER — Ambulatory Visit (INDEPENDENT_AMBULATORY_CARE_PROVIDER_SITE_OTHER): Payer: Self-pay | Admitting: Primary Care

## 2019-09-01 VITALS — BP 148/92 | HR 62 | Temp 97.2°F | Ht 66.0 in | Wt 201.0 lb

## 2019-09-01 DIAGNOSIS — G44019 Episodic cluster headache, not intractable: Secondary | ICD-10-CM

## 2019-09-01 DIAGNOSIS — H6123 Impacted cerumen, bilateral: Secondary | ICD-10-CM

## 2019-09-01 DIAGNOSIS — G894 Chronic pain syndrome: Secondary | ICD-10-CM

## 2019-09-01 DIAGNOSIS — G43109 Migraine with aura, not intractable, without status migrainosus: Secondary | ICD-10-CM

## 2019-09-01 DIAGNOSIS — R202 Paresthesia of skin: Secondary | ICD-10-CM

## 2019-09-01 DIAGNOSIS — R32 Unspecified urinary incontinence: Secondary | ICD-10-CM

## 2019-09-01 DIAGNOSIS — R159 Full incontinence of feces: Secondary | ICD-10-CM

## 2019-09-01 DIAGNOSIS — J45909 Unspecified asthma, uncomplicated: Secondary | ICD-10-CM

## 2019-09-01 DIAGNOSIS — Z760 Encounter for issue of repeat prescription: Secondary | ICD-10-CM

## 2019-09-01 DIAGNOSIS — I10 Essential (primary) hypertension: Secondary | ICD-10-CM

## 2019-09-01 MED FILL — MELOXICAM 15 MG TABLET: 15 | 30 days supply | Qty: 30 | Fill #4

## 2019-09-01 MED FILL — SYMBICORT 160-4.5 MCG INH: 160-4.5 | 30 days supply | Qty: 10 | Fill #7

## 2019-09-01 MED FILL — OMEPRAZOLE DR 40 MG CAPSULE: 40 | 30 days supply | Qty: 30 | Fill #1

## 2019-09-01 NOTE — Progress Notes (Signed)
Pt does not feel like the muscle relaxer is helping with arthritis states he has constant pain in his hands  Believes he may have sprained his ankle

## 2019-09-01 NOTE — Progress Notes (Signed)
Acute Office Visit  Subjective:    Patient ID: Shawn Raymond, male    DOB: [birth year], 39 y.o.   MRN: [id number]  Chief Complaint  Patient presents with  . 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 Endorses urinary and bowel incontinent daily, headaches and dizziness. Home remedies strengthes , bayer aspirin and rest makes his back better.   Past Medical History:  Diagnosis Date  . Asthma   . Sarcoidosis     Past Surgical History:  Procedure Laterality Date  . ANTERIOR CERVICAL DECOMP/DISCECTOMY FUSION N/A 04/02/2018   Procedure: ANTERIOR CERVICAL DECOMPRESSION/DISCECTOMY FUSION CERVICAL SIX- CERVICAL SEVEN ;  Surgeon: Lisbeth Renshaw, MD;  Location: [facility] OR;  Service: Neurosurgery;  Laterality: N/A;  . FEMUR FRACTURE SURGERY    . PATELLA RECONSTRUCTION    . POSTERIOR CERVICAL FUSION/FORAMINOTOMY N/A 04/02/2018   Procedure: CERVICAL THREE- CERVICAL FOUR, CERVICAL FOUR- CERVICAL FIVE, CERVICAL FIVE- CERVICAL SIX, CERVICAL SIX- CERVICAL SEVEN LAMINECTOMY WITH LATERAL MASS POSTERIOR SEGMENTAL INSTRUEMNTATION, POSTERIOR LATERAL ARTHRODESIS;  Surgeon: Lisbeth Renshaw, MD;  Location: [facility] OR;  Service: Neurosurgery;  Laterality: N/A;    Family History  Problem Relation Age of Onset  . Healthy Mother   . Healthy Father     Social History   Socioeconomic History  . Marital status: Divorced    Spouse name: Not on file  . Number of children: Not on file  . Years of education: Not on file  . Highest education level: Not on file  Occupational History  . Not on file  Tobacco Use  . Smoking status: Former Smoker    Packs/day: 0.70    Types: Cigarettes    Quit date: 12/24/2017    Years since quitting: 1.7  . Smokeless tobacco: Never Used  . Tobacco comment: patient has been  without a cigarette since 12/27/17  Substance and Sexual  Activity  . Alcohol use: No  . Drug use: Yes    Types: Marijuana    Comment: 3grams a day.   Marland Kitchen Sexual activity: Not on file  Other Topics Concern  . Not on file  Social History Narrative  . Not on file   Social Determinants of Health   Financial Resource Strain:   . Difficulty of Paying Living Expenses:   Food Insecurity:   . Worried About Programme researcher, broadcasting/film/video in the Last Year:   . Barista in the Last Year:   Transportation Needs:   . Freight forwarder (Medical):   Marland Kitchen Lack of Transportation (Non-Medical):   Physical Activity:   . Days of Exercise per Week:   . Minutes of Exercise per Session:   Stress:   . Feeling of Stress :   Social Connections:   . Frequency of Communication with Friends and Family:   . Frequency of Social Gatherings with Friends and Family:   . Attends Religious Services:   . Active Member of Clubs or Organizations:   . Attends Banker Meetings:   Marland Kitchen Marital Status:   Intimate Partner Violence:   . Fear of Current or Ex-Partner:   . Emotionally Abused:   Marland Kitchen Physically Abused:   . Sexually Abused:     Outpatient Medications Prior to Visit  Medication Sig Dispense Refill  . albuterol (PROVENTIL) (5 MG/ML) 0.5% nebulizer solution Take 0.5 mLs (2.5 mg  total) by nebulization every 4 (four) hours as needed for wheezing or shortness of breath. 20 mL 0  . budesonide-formoterol (SYMBICORT) 160-4.5 MCG/ACT inhaler Inhale 2 puffs into the lungs 2 (two) times daily. 1 Inhaler 11  . meloxicam (MOBIC) 15 MG tablet Take 1 tablet (15 mg total) by mouth daily. 90 tablet 1  . methocarbamol (ROBAXIN) 500 MG tablet TAKE 1 TABLET (500 MG TOTAL) BY MOUTH 3 (THREE) TIMES DAILY. 60 tablet 2  . omeprazole (PRILOSEC) 40 MG capsule TAKE 1 CAPSULE (40 MG TOTAL) BY MOUTH DAILY. 30 capsule 2  . albuterol (VENTOLIN HFA) 108 (90 Base) MCG/ACT inhaler INHALE 2 PUFFS INTO THE LUNGS EVERY 4 (FOUR) HOURS AS NEEDED FOR WHEEZING OR SHORTNESS OF BREATH. 18 g 2  .  SUMAtriptan (IMITREX) 50 MG tablet TAKE 1 TABLET BY MOUTH AT THE START OF THE HEADACHE. MAY REPEAT IN 2 HOURS IF NO RESPONSE. NO MORE THAN 2 TABLETS IN 24 HOURS 10 tablet 1  . hydrochlorothiazide (HYDRODIURIL) 25 MG tablet Take 1 tablet (25 mg total) by mouth daily. (Patient not taking: Reported on 01/24/2019) 30 tablet 3   No facility-administered medications prior to visit.    Allergies  Allergen Reactions  . Shellfish Allergy Anaphylaxis    Review of Systems  Musculoskeletal: Positive for back pain and gait problem.       Uses a stick for a cane  Neurological: Positive for weakness, numbness and headaches.  All other systems reviewed and are negative.      Objective:    Physical Exam Vitals reviewed.  Constitutional:      Appearance: Normal appearance. He is obese.  HENT:     Head: Normocephalic.     Right Ear: There is impacted cerumen.     Left Ear: There is impacted cerumen.     Nose: Nose normal.  Eyes:     Extraocular Movements: Extraocular movements intact.     Pupils: Pupils are equal, round, and reactive to light.  Cardiovascular:     Rate and Rhythm: Normal rate and regular rhythm.  Pulmonary:     Breath sounds: Normal breath sounds.  Abdominal:     General: Bowel sounds are normal.  Musculoskeletal:        General: Normal range of motion.     Cervical back: Normal range of motion. Rigidity present.     Comments: Able to raise arms/extend  Move ankles and feet no decrease ROM   Neurological:     Mental Status: He is alert.     BP (!) 148/92 (BP Location: Left Arm, Patient Position: Sitting, Cuff Size: Normal)   Pulse 62   Temp (!) 97.2 F (36.2 C) (Temporal)   Ht 5\' 6"  (1.676 m)   Wt 201 lb (91.2 kg)   SpO2 95%   BMI 32.44 kg/m  Wt Readings from Last 3 Encounters:  09/01/19 201 lb (91.2 kg)  04/04/19 207 lb (93.9 kg)  01/24/19 203 lb (92.1 kg)    Health Maintenance Due  Topic Date Due  . COVID-19 Vaccine (1) Never done    There are no  preventive care reminders to display for this patient.   Lab Results  Component Value Date   TSH 2.220 02/08/2018   Lab Results  Component Value Date   WBC 13.4 (H) 04/04/2019   HGB 13.9 04/04/2019   HCT 43.1 04/04/2019   MCV 89.8 04/04/2019   PLT 255 04/04/2019   Lab Results  Component Value Date   NA 139  04/04/2019   K 3.8 04/04/2019   CO2 26 04/04/2019   GLUCOSE 100 (H) 04/04/2019   BUN 12 04/04/2019   CREATININE 1.02 04/04/2019   BILITOT 0.7 04/04/2019   ALKPHOS 108 04/04/2019   AST 20 04/04/2019   ALT 31 04/04/2019   PROT 7.7 04/04/2019   ALBUMIN 4.2 04/04/2019   CALCIUM 9.4 04/04/2019   ANIONGAP 12 04/04/2019   No results found for: CHOL No results found for: HDL No results found for: LDLCALC No results found for: TRIG No results found for: CHOLHDL No results found for: [id number]     Assessment & Plan:  Zaylyn was seen today for arthritis.  Diagnoses and all orders for this visit:  Essential hypertension Counseled on blood pressure goal of less than 130/80, low-sodium, DASH diet, medication compliance, 150 minutes of moderate intensity exercise per week. Refuses blood pressure medication states Bp elevated due to pain.  Paresthesias He states burning thighs right side and calf, tingling, prickling, or numbness hands.  Bilateral hands loss of group and use at times drops things  Refer to neurologist  Urinary incontinence, unspecified type Refer to neurology   Incontinence of feces, unspecified fecal incontinence type Refer to neurology   Chronic pain syndrome Refer to pain management  Bilateral impacted cerumen Return for ear lavage. Meds ordered this encounter  Medications  . SUMAtriptan (IMITREX) 50 MG tablet    Sig: Take 1 tablet (50 mg total) by mouth every 2 (two) hours as needed for migraine. May repeat in 2 hours if headache persists or recurs.    Dispense:  9 tablet    Refill:  1     Grayce Sessions, NP

## 2019-09-01 NOTE — Patient Instructions (Signed)

## 2019-09-02 MED FILL — ALBUTEROL SULFATE HFA 108 (: 108 (90 BAS | 25 days supply | Qty: 18 | Fill #0

## 2019-09-08 MED ORDER — SUMATRIPTAN SUCCINATE 50 MG PO TABS: 50.0000 mg | ORAL_TABLET | ORAL | 1 refills | Status: DC | PRN

## 2019-09-08 MED FILL — SUMATRIPTAN SUCC 50 MG TAB: 50 | 23 days supply | Qty: 9 | Fill #0

## 2019-09-09 ENCOUNTER — Ambulatory Visit (INDEPENDENT_AMBULATORY_CARE_PROVIDER_SITE_OTHER): Payer: Self-pay | Admitting: Primary Care

## 2019-09-11 ENCOUNTER — Ambulatory Visit (INDEPENDENT_AMBULATORY_CARE_PROVIDER_SITE_OTHER): Payer: Self-pay | Admitting: Primary Care

## 2019-09-11 ENCOUNTER — Encounter (INDEPENDENT_AMBULATORY_CARE_PROVIDER_SITE_OTHER): Payer: Self-pay

## 2019-09-24 ENCOUNTER — Other Ambulatory Visit (INDEPENDENT_AMBULATORY_CARE_PROVIDER_SITE_OTHER): Payer: Self-pay | Admitting: Primary Care

## 2019-09-24 NOTE — Telephone Encounter (Signed)
Sent to PCP ?

## 2019-09-29 ENCOUNTER — Other Ambulatory Visit (INDEPENDENT_AMBULATORY_CARE_PROVIDER_SITE_OTHER): Payer: Self-pay | Admitting: Primary Care

## 2019-09-29 ENCOUNTER — Ambulatory Visit (INDEPENDENT_AMBULATORY_CARE_PROVIDER_SITE_OTHER): Payer: Self-pay | Admitting: Primary Care

## 2019-09-29 DIAGNOSIS — Z760 Encounter for issue of repeat prescription: Secondary | ICD-10-CM

## 2019-09-29 DIAGNOSIS — J45909 Unspecified asthma, uncomplicated: Secondary | ICD-10-CM

## 2019-09-29 MED ORDER — ALBUTEROL SULFATE HFA 108 (90 BASE) MCG/ACT IN AERS: INHALATION_SPRAY | RESPIRATORY_TRACT | 2 refills | Status: DC

## 2019-09-29 MED ORDER — BUDESONIDE-FORMOTEROL FUMARATE 160-4.5 MCG/ACT IN AERO: 2.0000 | INHALATION_SPRAY | Freq: Two times a day (BID) | RESPIRATORY_TRACT | 11 refills | Status: DC

## 2019-09-30 ENCOUNTER — Encounter (INDEPENDENT_AMBULATORY_CARE_PROVIDER_SITE_OTHER): Payer: Self-pay | Admitting: Primary Care

## 2019-09-30 ENCOUNTER — Other Ambulatory Visit: Payer: Self-pay

## 2019-09-30 ENCOUNTER — Ambulatory Visit (INDEPENDENT_AMBULATORY_CARE_PROVIDER_SITE_OTHER): Payer: Self-pay | Admitting: Primary Care

## 2019-09-30 VITALS — BP 134/89 | HR 61 | Temp 97.2°F | Ht 66.0 in | Wt 198.0 lb

## 2019-09-30 DIAGNOSIS — E6609 Other obesity due to excess calories: Secondary | ICD-10-CM

## 2019-09-30 DIAGNOSIS — R030 Elevated blood-pressure reading, without diagnosis of hypertension: Secondary | ICD-10-CM

## 2019-09-30 DIAGNOSIS — Z125 Encounter for screening for malignant neoplasm of prostate: Secondary | ICD-10-CM

## 2019-09-30 DIAGNOSIS — Z6830 Body mass index (BMI) 30.0-30.9, adult: Secondary | ICD-10-CM

## 2019-09-30 DIAGNOSIS — H6123 Impacted cerumen, bilateral: Secondary | ICD-10-CM

## 2019-09-30 NOTE — Patient Instructions (Signed)

## 2019-09-30 NOTE — Progress Notes (Signed)
Acute Office Visit  Subjective:    Patient ID: Shawn Raymond, male    DOB: 1981/02/05, 39 y.o.   MRN: [id number]  Chief Complaint  Patient presents with   Cerumen Impaction    HPI [name] is in today for fasting labs and removal of cerumen impaction.  Today Blood pressures is slightly elevated states he is in pain in his neck and back constantly contributing to Bp elevation. Denies shortness of breath, headaches, chest pain or lower extremity edema Past Medical History:  Diagnosis Date   Asthma    Sarcoidosis     Past Surgical History:  Procedure Laterality Date   ANTERIOR CERVICAL DECOMP/DISCECTOMY FUSION N/A 04/02/2018   Procedure: ANTERIOR CERVICAL DECOMPRESSION/DISCECTOMY FUSION CERVICAL SIX- CERVICAL SEVEN ;  Surgeon: Lisbeth Renshaw, MD;  Location: [facility] OR;  Service: Neurosurgery;  Laterality: N/A;   FEMUR FRACTURE SURGERY     PATELLA RECONSTRUCTION     POSTERIOR CERVICAL FUSION/FORAMINOTOMY N/A 04/02/2018   Procedure: CERVICAL THREE- CERVICAL FOUR, CERVICAL FOUR- CERVICAL FIVE, CERVICAL FIVE- CERVICAL SIX, CERVICAL SIX- CERVICAL SEVEN LAMINECTOMY WITH LATERAL MASS POSTERIOR SEGMENTAL INSTRUEMNTATION, POSTERIOR LATERAL ARTHRODESIS;  Surgeon: Lisbeth Renshaw, MD;  Location: [facility] OR;  Service: Neurosurgery;  Laterality: N/A;    Family History  Problem Relation Age of Onset   Healthy Mother    Healthy Father     Social History   Socioeconomic History   Marital status: Divorced    Spouse name: Not on file   Number of children: Not on file   Years of education: Not on file   Highest education level: Not on file  Occupational History   Not on file  Tobacco Use   Smoking status: Former Smoker    Packs/day: 0.70    Types: Cigarettes    Quit date: 12/24/2017    Years since quitting: 1.7   Smokeless tobacco: Never Used   Tobacco comment: patient has been  without a cigarette since 12/27/17  Vaping Use   Vaping Use: Never used   Substance and Sexual Activity   Alcohol use: No   Drug use: Yes    Types: Marijuana    Comment: 3grams a day.    Sexual activity: Not on file  Other Topics Concern   Not on file  Social History Narrative   Not on file   Social Determinants of Health   Financial Resource Strain:    Difficulty of Paying Living Expenses:   Food Insecurity:    Worried About Running Out of Food in the Last Year:    Barista in the Last Year:   Transportation Needs:    Freight forwarder (Medical):    Lack of Transportation (Non-Medical):   Physical Activity:    Days of Exercise per Week:    Minutes of Exercise per Session:   Stress:    Feeling of Stress :   Social Connections:    Frequency of Communication with Friends and Family:    Frequency of Social Gatherings with Friends and Family:    Attends Religious Services:    Active Member of Clubs or Organizations:    Attends Engineer, structural:    Marital Status:   Intimate Partner Violence:    Fear of Current or Ex-Partner:    Emotionally Abused:    Physically Abused:    Sexually Abused:     Outpatient Medications Prior to Visit  Medication Sig Dispense Refill   albuterol (VENTOLIN HFA) 108 (90 Base) MCG/ACT  inhaler Take 1-2 puffs as needed for shortness of breath. This is not for daily use RESCUE INHALER 18 g 2   budesonide-formoterol (SYMBICORT) 160-4.5 MCG/ACT inhaler Inhale 2 puffs into the lungs 2 (two) times daily. 1 Inhaler 11   diphenhydrAMINE (BENADRYL) 25 mg capsule Take by mouth.     meloxicam (MOBIC) 15 MG tablet Take 1 tablet (15 mg total) by mouth daily. 90 tablet 1   methocarbamol (ROBAXIN) 500 MG tablet TAKE 1 TABLET (500 MG TOTAL) BY MOUTH 3 (THREE) TIMES DAILY. 60 tablet 2   omeprazole (PRILOSEC) 40 MG capsule TAKE 1 CAPSULE (40 MG TOTAL) BY MOUTH DAILY. 30 capsule 2   predniSONE (DELTASONE) 20 MG tablet Take by mouth.     SUMAtriptan (IMITREX) 50 MG tablet Take 1  tablet (50 mg total) by mouth every 2 (two) hours as needed for migraine. May repeat in 2 hours if headache persists or recurs. 9 tablet 1   No facility-administered medications prior to visit.    Allergies  Allergen Reactions   Shellfish Allergy Anaphylaxis    Review of Systems  Musculoskeletal: Positive for back pain.       Neck  All other systems reviewed and are negative.      Objective:    Physical Exam Vitals reviewed.  Constitutional:      Appearance: He is obese.  HENT:     Right Ear: There is impacted cerumen.     Left Ear: There is impacted cerumen.  Cardiovascular:     Rate and Rhythm: Normal rate and regular rhythm.  Pulmonary:     Effort: Pulmonary effort is normal.     Breath sounds: Normal breath sounds.  Abdominal:     General: Bowel sounds are normal.  Musculoskeletal:        General: Normal range of motion.     Cervical back: Normal range of motion and neck supple.  Skin:    General: Skin is warm and dry.  Neurological:     Mental Status: He is alert and oriented to person, place, and time.  Psychiatric:        Mood and Affect: Mood normal.        Behavior: Behavior normal.        Thought Content: Thought content normal.        Judgment: Judgment normal.     BP 134/89 (BP Location: Right Arm, Patient Position: Sitting, Cuff Size: Large)    Pulse 61    Temp (!) 97.2 F (36.2 C) (Temporal)    Ht 5\' 6"  (1.676 m)    Wt 198 lb (89.8 kg)    SpO2 98%    BMI 31.96 kg/m  Wt Readings from Last 3 Encounters:  09/30/19 198 lb (89.8 kg)  09/01/19 201 lb (91.2 kg)  04/04/19 207 lb (93.9 kg)    Health Maintenance Due  Topic Date Due   Hepatitis C Screening  Never done   COVID-19 Vaccine (1) Never done    There are no preventive care reminders to display for this patient.   Lab Results  Component Value Date   TSH 2.220 02/08/2018   Lab Results  Component Value Date   WBC 13.4 (H) 04/04/2019   HGB 13.9 04/04/2019   HCT 43.1 04/04/2019    MCV 89.8 04/04/2019   PLT 255 04/04/2019   Lab Results  Component Value Date   NA 139 04/04/2019   K 3.8 04/04/2019   CO2 26 04/04/2019   GLUCOSE 100 (H)  04/04/2019   BUN 12 04/04/2019   CREATININE 1.02 04/04/2019   BILITOT 0.7 04/04/2019   ALKPHOS 108 04/04/2019   AST 20 04/04/2019   ALT 31 04/04/2019   PROT 7.7 04/04/2019   ALBUMIN 4.2 04/04/2019   CALCIUM 9.4 04/04/2019   ANIONGAP 12 04/04/2019   No results found for: CHOL No results found for: HDL No results found for: LDLCALC No results found for: TRIG No results found for: CHOLHDL No results found for: HGBA1C     Assessment & Plan:  Isaia was seen today for cerumen impaction.  Diagnoses and all orders for this visit:  Prostate cancer screening PSA -LAB  Bilateral impacted cerumen Ear irrigation   Elevated blood pressure reading without diagnosis of hypertension Secondary to pain discussed. Counseled on blood pressure goal of less than 130/80, low-sodium, DASH diet, medication compliance, 150 minutes of moderate intensity exercise per week.  Class 1 obesity due to excess calories without serious comorbidity with body mass index (BMI) of 30.0 to 30.9 in adult Obesity is 30-39 indicating an excess in caloric intake or underlining conditions. This may lead to other co-morbidities. Lifestyle modifications of diet and exercise may reduce obesity. Started excising 5 days a week dependent how much back pain    No orders of the defined types were placed in this encounter.    Kerin Perna, NP

## 2019-10-01 LAB — CBC WITH DIFFERENTIAL/PLATELET
Basophils Absolute: 0 10*3/uL (ref 0.0–0.2)
Basos: 0 %
EOS (ABSOLUTE): 0 10*3/uL (ref 0.0–0.4)
Eos: 0 %
Hematocrit: 43 % (ref 37.5–51.0)
Hemoglobin: 14.1 g/dL (ref 13.0–17.7)
Immature Grans (Abs): 0.1 10*3/uL (ref 0.0–0.1)
Immature Granulocytes: 1 %
Lymphocytes Absolute: 1 10*3/uL (ref 0.7–3.1)
Lymphs: 6 %
MCH: 28.4 pg (ref 26.6–33.0)
MCHC: 32.8 g/dL (ref 31.5–35.7)
MCV: 87 fL (ref 79–97)
Monocytes Absolute: 0.3 10*3/uL (ref 0.1–0.9)
Monocytes: 2 %
Neutrophils Absolute: 14.4 10*3/uL — ABNORMAL HIGH (ref 1.4–7.0)
Neutrophils: 91 %
Platelets: 259 10*3/uL (ref 150–450)
RBC: 4.97 x10E6/uL (ref 4.14–5.80)
RDW: 14.1 % (ref 11.6–15.4)
WBC: 15.9 10*3/uL — ABNORMAL HIGH (ref 3.4–10.8)

## 2019-10-01 LAB — LIPID PANEL
Chol/HDL Ratio: 5.4 ratio — ABNORMAL HIGH (ref 0.0–5.0)
Cholesterol, Total: 242 mg/dL — ABNORMAL HIGH (ref 100–199)
HDL: 45 mg/dL (ref >39)
LDL Chol Calc (NIH): 178 mg/dL — ABNORMAL HIGH (ref 0–99)
Triglycerides: 104 mg/dL (ref 0–149)
VLDL Cholesterol Cal: 19 mg/dL (ref 5–40)

## 2019-10-01 LAB — PSA: Prostate Specific Ag, Serum: 0.4 ng/mL (ref 0.0–4.0)

## 2019-10-06 ENCOUNTER — Other Ambulatory Visit (INDEPENDENT_AMBULATORY_CARE_PROVIDER_SITE_OTHER): Payer: Self-pay | Admitting: Primary Care

## 2019-10-06 DIAGNOSIS — E782 Mixed hyperlipidemia: Secondary | ICD-10-CM

## 2019-10-06 MED ORDER — ATORVASTATIN CALCIUM 40 MG PO TABS: 40.0000 mg | ORAL_TABLET | Freq: Every day | ORAL | 3 refills | Status: DC

## 2019-10-06 MED FILL — ?ATORVASTATIN 40MG TABLET: 40 | 90 days supply | Qty: 90 | Fill #0

## 2019-10-08 ENCOUNTER — Other Ambulatory Visit (INDEPENDENT_AMBULATORY_CARE_PROVIDER_SITE_OTHER): Payer: Self-pay | Admitting: Primary Care

## 2019-10-08 DIAGNOSIS — Z760 Encounter for issue of repeat prescription: Secondary | ICD-10-CM

## 2019-10-08 DIAGNOSIS — R12 Heartburn: Secondary | ICD-10-CM

## 2019-10-08 MED FILL — METHOCARBAMOL 500 MG TABS: 500 | 20 days supply | Qty: 60 | Fill #2

## 2019-10-08 MED FILL — MELOXICAM 15 MG TABLET: 15 | 30 days supply | Qty: 30 | Fill #5

## 2019-10-13 MED FILL — SUMATRIPTAN SUCC 50 MG TAB: 50 | 23 days supply | Qty: 9 | Fill #1

## 2019-10-21 MED FILL — ALBUTEROL SULFATE HFA 108 (: 108 (90 BAS | 25 days supply | Qty: 18 | Fill #2

## 2019-10-21 MED FILL — SYMBICORT 160-4.5 MCG INH: 160-4.5 | 30 days supply | Qty: 10 | Fill #9

## 2019-10-24 MED FILL — OMEPRAZOLE DR 40 MG CAPSULE: 40 | 30 days supply | Qty: 30 | Fill #0

## 2019-10-31 ENCOUNTER — Other Ambulatory Visit (INDEPENDENT_AMBULATORY_CARE_PROVIDER_SITE_OTHER): Payer: Self-pay | Admitting: Family Medicine

## 2019-10-31 ENCOUNTER — Ambulatory Visit (INDEPENDENT_AMBULATORY_CARE_PROVIDER_SITE_OTHER): Payer: Self-pay | Admitting: Primary Care

## 2019-10-31 ENCOUNTER — Other Ambulatory Visit: Payer: Self-pay

## 2019-10-31 ENCOUNTER — Encounter (INDEPENDENT_AMBULATORY_CARE_PROVIDER_SITE_OTHER): Payer: Self-pay | Admitting: Primary Care

## 2019-10-31 VITALS — BP 136/84 | HR 68 | Temp 98.1°F | Ht 66.0 in | Wt 198.0 lb

## 2019-10-31 DIAGNOSIS — K420 Umbilical hernia with obstruction, without gangrene: Secondary | ICD-10-CM

## 2019-10-31 DIAGNOSIS — G43109 Migraine with aura, not intractable, without status migrainosus: Secondary | ICD-10-CM

## 2019-10-31 DIAGNOSIS — M5412 Radiculopathy, cervical region: Secondary | ICD-10-CM

## 2019-10-31 DIAGNOSIS — R030 Elevated blood-pressure reading, without diagnosis of hypertension: Secondary | ICD-10-CM

## 2019-10-31 DIAGNOSIS — M62830 Muscle spasm of back: Secondary | ICD-10-CM

## 2019-10-31 DIAGNOSIS — H6123 Impacted cerumen, bilateral: Secondary | ICD-10-CM

## 2019-10-31 DIAGNOSIS — Z760 Encounter for issue of repeat prescription: Secondary | ICD-10-CM

## 2019-10-31 MED ORDER — SUMATRIPTAN SUCCINATE 50 MG PO TABS: 50.0000 mg | ORAL_TABLET | ORAL | 1 refills | Status: DC | PRN

## 2019-10-31 MED ORDER — MELOXICAM 15 MG PO TABS: 15.0000 mg | ORAL_TABLET | Freq: Every day | ORAL | 1 refills | Status: DC

## 2019-10-31 MED FILL — SUMATRIPTAN SUCC 50 MG TAB: 50 | 23 days supply | Qty: 9 | Fill #0

## 2019-10-31 MED FILL — MELOXICAM 15 MG TABLET: 15 | 30 days supply | Qty: 30 | Fill #0

## 2019-10-31 MED FILL — !VENTOLIN HFA INHALER: 108 (90 BAS | 30 days supply | Qty: 18 | Fill #0

## 2019-10-31 MED FILL — SYMBICORT 160-4.5 MCG INH: 160-4.5 | 30 days supply | Qty: 10 | Fill #0

## 2019-10-31 NOTE — Progress Notes (Signed)
Established Patient Office Visit  Subjective:  Patient ID: Shawn Raymond, male    DOB: [birth year]  Age: 39 y.o. MRN: [id number]  CC:  Chief Complaint  Patient presents with  . Back Pain  . Abdominal Pain    feels like pulling in his stomach    HPI Shawn Raymond is a 39 year old male presents for a acute visit. Shawn Raymond is concern with a knot pushing out of navel. Feels like a belt is wrapped around his stomach.   Past Medical History:  Diagnosis Date  . Asthma   . Sarcoidosis     Past Surgical History:  Procedure Laterality Date  . ANTERIOR CERVICAL DECOMP/DISCECTOMY FUSION N/A 04/02/2018   Procedure: ANTERIOR CERVICAL DECOMPRESSION/DISCECTOMY FUSION CERVICAL SIX- CERVICAL SEVEN ;  Surgeon: Lisbeth Renshaw, MD;  Location: [facility];  Service: Neurosurgery;  Laterality: N/A;  . FEMUR FRACTURE SURGERY    . PATELLA RECONSTRUCTION    . POSTERIOR CERVICAL FUSION/FORAMINOTOMY N/A 04/02/2018   Procedure: CERVICAL THREE- CERVICAL FOUR, CERVICAL FOUR- CERVICAL FIVE, CERVICAL FIVE- CERVICAL SIX, CERVICAL SIX- CERVICAL SEVEN LAMINECTOMY WITH LATERAL MASS POSTERIOR SEGMENTAL INSTRUEMNTATION, POSTERIOR LATERAL ARTHRODESIS;  Surgeon: Lisbeth Renshaw, MD;  Location: [facility];  Service: Neurosurgery;  Laterality: N/A;    Family History  Problem Relation Age of Onset  . Healthy Mother   . Healthy Father     Social History   Socioeconomic History  . Marital status: Divorced    Spouse name: Not on file  . Number of children: Not on file  . Years of education: Not on file  . Highest education level: Not on file  Occupational History  . Not on file  Tobacco Use  . Smoking status: Former Smoker    Packs/day: 0.70    Types: Cigarettes    Quit date: 12/24/2017    Years since quitting: 1.8  . Smokeless tobacco: Never Used  . Tobacco comment: patient has been  without a cigarette since 12/27/17  Vaping Use  . Vaping Use: Never used  Substance and Sexual Activity  . Alcohol  use: No  . Drug use: Yes    Types: Marijuana    Comment: 3grams a day.   Marland Kitchen Sexual activity: Not on file  Other Topics Concern  . Not on file  Social History Narrative  . Not on file   Social Determinants of Health   Financial Resource Strain:   . Difficulty of Paying Living Expenses:   Food Insecurity:   . Worried About Programme researcher, broadcasting/film/video in the Last Year:   . Barista in the Last Year:   Transportation Needs:   . Freight forwarder (Medical):   Marland Kitchen Lack of Transportation (Non-Medical):   Physical Activity:   . Days of Exercise per Week:   . Minutes of Exercise per Session:   Stress:   . Feeling of Stress :   Social Connections:   . Frequency of Communication with Friends and Family:   . Frequency of Social Gatherings with Friends and Family:   . Attends Religious Services:   . Active Member of Clubs or Organizations:   . Attends Banker Meetings:   Marland Kitchen Marital Status:   Intimate Partner Violence:   . Fear of Current or Ex-Partner:   . Emotionally Abused:   Marland Kitchen Physically Abused:   . Sexually Abused:     Outpatient Medications Prior to Visit  Medication Sig Dispense Refill  . albuterol (VENTOLIN HFA) 108 (90  Base) MCG/ACT inhaler Take 1-2 puffs as needed for shortness of breath. This is not for daily use RESCUE INHALER 18 g 2  . atorvastatin (LIPITOR) 40 MG tablet Take 1 tablet (40 mg total) by mouth daily. 90 tablet 3  . budesonide-formoterol (SYMBICORT) 160-4.5 MCG/ACT inhaler Inhale 2 puffs into the lungs 2 (two) times daily. 1 Inhaler 11  . omeprazole (PRILOSEC) 40 MG capsule TAKE 1 CAPSULE (40 MG TOTAL) BY MOUTH DAILY. 30 capsule 2  . meloxicam (MOBIC) 15 MG tablet Take 1 tablet (15 mg total) by mouth daily. 90 tablet 1  . methocarbamol (ROBAXIN) 500 MG tablet TAKE 1 TABLET (500 MG TOTAL) BY MOUTH 3 (THREE) TIMES DAILY. 60 tablet 2  . diphenhydrAMINE (BENADRYL) 25 mg capsule Take by mouth.    . SUMAtriptan (IMITREX) 50 MG tablet Take 1 tablet  (50 mg total) by mouth every 2 (two) hours as needed for migraine. May repeat in 2 hours if headache persists or recurs. (Patient not taking: Reported on 10/31/2019) 9 tablet 1   No facility-administered medications prior to visit.    Allergies  Allergen Reactions  . Shellfish Allergy Anaphylaxis    ROS Review of Systems  Gastrointestinal: Positive for abdominal distention and abdominal pain.  Musculoskeletal: Positive for gait problem.  Neurological: Positive for headaches.  All other systems reviewed and are negative.     Objective:    Physical Exam Vitals reviewed.  Constitutional:      Appearance: Shawn Raymond is obese.  HENT:     Head: Normocephalic.  Eyes:     Extraocular Movements: Extraocular movements intact.     Pupils: Pupils are equal, round, and reactive to light.  Cardiovascular:     Rate and Rhythm: Normal rate and regular rhythm.  Pulmonary:     Effort: Pulmonary effort is normal.     Breath sounds: Normal breath sounds.  Abdominal:     General: Abdomen is protuberant. Bowel sounds are normal. There is distension.     Palpations: Abdomen is soft.     Tenderness: There is generalized abdominal tenderness.     Hernia: A hernia is present. Hernia is present in the umbilical area.  Skin:    General: Skin is warm and dry.  Neurological:     Mental Status: Shawn Raymond is alert and oriented to person, place, and time.  Psychiatric:        Mood and Affect: Mood normal.     BP 136/84 (BP Location: Left Arm, Patient Position: Sitting, Cuff Size: Normal)   Pulse 68   Temp 98.1 F (36.7 C) (Oral)   Ht 5\' 6"  (1.676 m)   Wt 198 lb (89.8 kg)   SpO2 95%   BMI 31.96 kg/m  Wt Readings from Last 3 Encounters:  10/31/19 198 lb (89.8 kg)  09/30/19 198 lb (89.8 kg)  09/01/19 201 lb (91.2 kg)     Health Maintenance Due  Topic Date Due  . Hepatitis C Screening  Never done  . COVID-19 Vaccine (1) Never done    There are no preventive care reminders to display for this  patient.  Lab Results  Component Value Date   TSH 2.220 02/08/2018   Lab Results  Component Value Date   WBC 15.9 (H) 09/30/2019   HGB 14.1 09/30/2019   HCT 43.0 09/30/2019   MCV 87 09/30/2019   PLT 259 09/30/2019   Lab Results  Component Value Date   NA 139 04/04/2019   K 3.8 04/04/2019   CO2 26  04/04/2019   GLUCOSE 100 (H) 04/04/2019   BUN 12 04/04/2019   CREATININE 1.02 04/04/2019   BILITOT 0.7 04/04/2019   ALKPHOS 108 04/04/2019   AST 20 04/04/2019   ALT 31 04/04/2019   PROT 7.7 04/04/2019   ALBUMIN 4.2 04/04/2019   CALCIUM 9.4 04/04/2019   ANIONGAP 12 04/04/2019   Lab Results  Component Value Date   CHOL 242 (H) 09/30/2019   Lab Results  Component Value Date   HDL 45 09/30/2019   Lab Results  Component Value Date   LDLCALC 178 (H) 09/30/2019   Lab Results  Component Value Date   TRIG 104 09/30/2019   Lab Results  Component Value Date   CHOLHDL 5.4 (H) 09/30/2019   No results found for: HGBA1C    Assessment & Plan:  Shawn Raymond was seen today for back pain and abdominal pain.  Diagnoses and all orders for this visit:  Umbilical hernia with obstruction, without gangrene Will need to be evaluated by general surgery  Migraine with aura and without status migrainosus, not intractable Signs can be individualized with throbbing pain on any area of the head.  They may come with an aura-dizziness, nausea, sensitive to light sound or smell.  Triggers can be caused by drinking alcohol smoking or certain medications, eating and drinking certain products  This condition may be triggered or caused by: Caffeine, aged cheese, and chocolate. -     SUMAtriptan (IMITREX) 50 MG tablet; Take 1 tablet (50 mg total) by mouth every 2 (two) hours as needed for migraine. May repeat in 2 hours if headache persists or recurs.  Elevated blood pressure reading without diagnosis of hypertension Discussed diagnosis for hypertension 130/80 today's blood pressure is 134/84  counseled on dietary eating decrease in sodium in diet especially potted meats and canned foods  Bilateral impacted cerumen Return for ear lavage  Muscle spasm of back -     meloxicam (MOBIC) 15 MG tablet; Take 1 tablet (15 mg total) by mouth daily.    Follow-up: Return for schedule for ear lavage.    Grayce Sessions, NP

## 2019-10-31 NOTE — Progress Notes (Signed)
Has a knot pushing out of navel Feels like a belt is wrapped around his stomach

## 2019-10-31 NOTE — Patient Instructions (Signed)
Umbilical Hernia, Adult  A hernia is a bulge of tissue that pushes through an opening between muscles. An umbilical hernia happens in the abdomen, near the belly button (umbilicus). The hernia may contain tissues from the small intestine, large intestine, or fatty tissue covering the intestines (omentum). Umbilical hernias in adults tend to get worse over time, and they require surgical treatment. There are several types of umbilical hernias. You may have:  A hernia located just above or below the umbilicus (indirect hernia). This is the most common type of umbilical hernia in adults.  A hernia that forms through an opening formed by the umbilicus (direct hernia).  A hernia that comes and goes (reducible hernia). A reducible hernia may be visible only when you strain, lift something heavy, or cough. This type of hernia can be pushed back into the abdomen (reduced).  A hernia that traps abdominal tissue inside the hernia (incarcerated hernia). This type of hernia cannot be reduced.  A hernia that cuts off blood flow to the tissues inside the hernia (strangulated hernia). The tissues can start to die if this happens. This type of hernia requires emergency treatment. What are the causes? An umbilical hernia happens when tissue inside the abdomen presses on a weak area of the abdominal muscles. What increases the risk? You may have a greater risk of this condition if you:  Are obese.  Have had several pregnancies.  Have a buildup of fluid inside your abdomen (ascites).  Have had surgery that weakens the abdominal muscles. What are the signs or symptoms? The main symptom of this condition is a painless bulge at or near the belly button. A reducible hernia may be visible only when you strain, lift something heavy, or cough. Other symptoms may include:  Dull pain.  A feeling of pressure. Symptoms of a strangulated hernia may include:  Pain that gets increasingly worse.  Nausea and  vomiting.  Pain when pressing on the hernia.  Skin over the hernia becoming red or purple.  Constipation.  Blood in the stool. How is this diagnosed? This condition may be diagnosed based on:  A physical exam. You may be asked to cough or strain while standing. These actions increase the pressure inside your abdomen and force the hernia through the opening in your muscles. Your health care provider may try to reduce the hernia by pressing on it.  Your symptoms and medical history. How is this treated? Surgery is the only treatment for an umbilical hernia. Surgery for a strangulated hernia is done as soon as possible. If you have a small hernia that is not incarcerated, you may need to lose weight before having surgery. Follow these instructions at home:  Lose weight, if told by your health care provider.  Do not try to push the hernia back in.  Watch your hernia for any changes in color or size. Tell your health care provider if any changes occur.  You may need to avoid activities that increase pressure on your hernia.  Do not lift anything that is heavier than 10 lb (4.5 kg) until your health care provider says that this is safe.  Take over-the-counter and prescription medicines only as told by your health care provider.  Keep all follow-up visits as told by your health care provider. This is important. Contact a health care provider if:  Your hernia gets larger.  Your hernia becomes painful. Get help right away if:  You develop sudden, severe pain near the area of your hernia.    You have pain as well as nausea or vomiting.  You have pain and the skin over your hernia changes color.  You develop a fever. This information is not intended to replace advice given to you by your health care provider. Make sure you discuss any questions you have with your health care provider. Document Revised: 05/16/2017 Document Reviewed: 10/02/2016 Elsevier Patient Education  2020  Elsevier Inc.  

## 2019-10-31 NOTE — Telephone Encounter (Signed)
Requested medication (s) are due for refill today: yes  Requested medication (s) are on the active medication list: yes  Last refill:  07/09/19  #60  2 refills  Future visit scheduled: yes  Notes to clinic:  Not delegated    Requested Prescriptions  Pending Prescriptions Disp Refills   methocarbamol (ROBAXIN) 500 MG tablet [Pharmacy Med Name: METHOCARBAMOL 500 MG TABS 500 Tablet] 60 tablet 2    Sig: TAKE 1 TABLET (500 MG TOTAL) BY MOUTH 3 (THREE) TIMES DAILY.      Not Delegated - Analgesics:  Muscle Relaxants Failed - 10/31/2019  2:58 PM      Failed - This refill cannot be delegated      Passed - Valid encounter within last 6 months    Recent Outpatient Visits           Today Umbilical hernia with obstruction, without gangrene   [facility] Grayce Sessions, NP   1 month ago Prostate cancer screening   [facility] Grayce Sessions, NP   2 months ago Essential hypertension   [facility] Grayce Sessions, NP   5 months ago Arthritis   [facility] Grayce Sessions, NP   7 months ago [place]   [facility] Grayce Sessions, NP       Future Appointments             In 2 weeks Randa Evens, Kinnie Scales, NP [facility]   In 5 months Randa Evens Kinnie Scales, NP [facility]

## 2019-11-03 MED FILL — METHOCARBAMOL 500 MG TABS: 500 | 20 days supply | Qty: 60 | Fill #0

## 2019-11-14 ENCOUNTER — Encounter (INDEPENDENT_AMBULATORY_CARE_PROVIDER_SITE_OTHER): Payer: Self-pay | Admitting: Primary Care

## 2019-11-14 ENCOUNTER — Ambulatory Visit (INDEPENDENT_AMBULATORY_CARE_PROVIDER_SITE_OTHER): Payer: Self-pay | Admitting: Primary Care

## 2019-11-14 ENCOUNTER — Other Ambulatory Visit: Payer: Self-pay

## 2019-11-14 VITALS — Wt 197.0 lb

## 2019-11-14 DIAGNOSIS — H6123 Impacted cerumen, bilateral: Secondary | ICD-10-CM

## 2019-11-14 NOTE — Progress Notes (Signed)
Pt arrived for bil ear lavage.

## 2019-11-17 NOTE — Progress Notes (Signed)
Acute Office Visit  Subjective:    Patient ID: Shawn Raymond, male    DOB: 03-Jul-1980, 40 y.o.   MRN: [id number]  Chief Complaint  Patient presents with  . Cerumen Impaction    HPI  [name] is a 39 year old male who presents today for bilateral cerumen impaction. Past Medical History:  Diagnosis Date  . Asthma   . Sarcoidosis     Past Surgical History:  Procedure Laterality Date  . ANTERIOR CERVICAL DECOMP/DISCECTOMY FUSION N/A 04/02/2018   Procedure: ANTERIOR CERVICAL DECOMPRESSION/DISCECTOMY FUSION CERVICAL SIX- CERVICAL SEVEN ;  Surgeon: Lisbeth Renshaw, MD;  Location: [facility] OR;  Service: Neurosurgery;  Laterality: N/A;  . FEMUR FRACTURE SURGERY    . PATELLA RECONSTRUCTION    . POSTERIOR CERVICAL FUSION/FORAMINOTOMY N/A 04/02/2018   Procedure: CERVICAL THREE- CERVICAL FOUR, CERVICAL FOUR- CERVICAL FIVE, CERVICAL FIVE- CERVICAL SIX, CERVICAL SIX- CERVICAL SEVEN LAMINECTOMY WITH LATERAL MASS POSTERIOR SEGMENTAL INSTRUEMNTATION, POSTERIOR LATERAL ARTHRODESIS;  Surgeon: Lisbeth Renshaw, MD;  Location: [facility] OR;  Service: Neurosurgery;  Laterality: N/A;    Family History  Problem Relation Age of Onset  . Healthy Mother   . Healthy Father     Social History   Socioeconomic History  . Marital status: Divorced    Spouse name: Not on file  . Number of children: Not on file  . Years of education: Not on file  . Highest education level: Not on file  Occupational History  . Not on file  Tobacco Use  . Smoking status: Former Smoker    Packs/day: 0.70    Types: Cigarettes    Quit date: 12/24/2017    Years since quitting: 1.8  . Smokeless tobacco: Never Used  . Tobacco comment: patient has been  without a cigarette since 12/27/17  Vaping Use  . Vaping Use: Never used  Substance and Sexual Activity  . Alcohol use: No  . Drug use: Yes    Types: Marijuana    Comment: 3grams a day.   Marland Kitchen Sexual activity: Not on file  Other Topics Concern  . Not on  file  Social History Narrative  . Not on file   Social Determinants of Health   Financial Resource Strain:   . Difficulty of Paying Living Expenses:   Food Insecurity:   . Worried About Programme researcher, broadcasting/film/video in the Last Year:   . Barista in the Last Year:   Transportation Needs:   . Freight forwarder (Medical):   Marland Kitchen Lack of Transportation (Non-Medical):   Physical Activity:   . Days of Exercise per Week:   . Minutes of Exercise per Session:   Stress:   . Feeling of Stress :   Social Connections:   . Frequency of Communication with Friends and Family:   . Frequency of Social Gatherings with Friends and Family:   . Attends Religious Services:   . Active Member of Clubs or Organizations:   . Attends Banker Meetings:   Marland Kitchen Marital Status:   Intimate Partner Violence:   . Fear of Current or Ex-Partner:   . Emotionally Abused:   Marland Kitchen Physically Abused:   . Sexually Abused:     Outpatient Medications Prior to Visit  Medication Sig Dispense Refill  . albuterol (VENTOLIN HFA) 108 (90 Base) MCG/ACT inhaler Take 1-2 puffs as needed for shortness of breath. This is not for daily use RESCUE INHALER 18 g 2  . atorvastatin (LIPITOR) 40 MG tablet Take 1 tablet (40  mg total) by mouth daily. 90 tablet 3  . budesonide-formoterol (SYMBICORT) 160-4.5 MCG/ACT inhaler Inhale 2 puffs into the lungs 2 (two) times daily. 1 Inhaler 11  . meloxicam (MOBIC) 15 MG tablet Take 1 tablet (15 mg total) by mouth daily. 90 tablet 1  . methocarbamol (ROBAXIN) 500 MG tablet TAKE 1 TABLET (500 MG TOTAL) BY MOUTH 3 (THREE) TIMES DAILY. 60 tablet 2  . omeprazole (PRILOSEC) 40 MG capsule TAKE 1 CAPSULE (40 MG TOTAL) BY MOUTH DAILY. 30 capsule 2  . SUMAtriptan (IMITREX) 50 MG tablet Take 1 tablet (50 mg total) by mouth every 2 (two) hours as needed for migraine. May repeat in 2 hours if headache persists or recurs. 9 tablet 1   No facility-administered medications prior to visit.    Allergies   Allergen Reactions  . Shellfish Allergy Anaphylaxis    Review of Systems  HENT: Positive for hearing loss.        Cerumen impaction  All other systems reviewed and are negative.      Objective:    Physical Exam Vitals reviewed.  HENT:     Right Ear: There is impacted cerumen.     Left Ear: There is impacted cerumen.  Cardiovascular:     Rate and Rhythm: Normal rate and regular rhythm.  Pulmonary:     Effort: Pulmonary effort is normal.     Breath sounds: Normal breath sounds.  Abdominal:     General: Bowel sounds are normal.  Musculoskeletal:        General: Normal range of motion.     Cervical back: Normal range of motion.  Neurological:     Mental Status: He is alert and oriented to person, place, and time.  Psychiatric:        Mood and Affect: Mood normal.        Behavior: Behavior normal.     Wt 197 lb (89.4 kg)   BMI 31.80 kg/m  Wt Readings from Last 3 Encounters:  11/14/19 197 lb (89.4 kg)  10/31/19 198 lb (89.8 kg)  09/30/19 198 lb (89.8 kg)    Health Maintenance Due  Topic Date Due  . Hepatitis C Screening  Never done  . COVID-19 Vaccine (1) Never done  . INFLUENZA VACCINE  11/16/2019    There are no preventive care reminders to display for this patient.   Lab Results  Component Value Date   TSH 2.220 02/08/2018   Lab Results  Component Value Date   WBC 15.9 (H) 09/30/2019   HGB 14.1 09/30/2019   HCT 43.0 09/30/2019   MCV 87 09/30/2019   PLT 259 09/30/2019   Lab Results  Component Value Date   NA 139 04/04/2019   K 3.8 04/04/2019   CO2 26 04/04/2019   GLUCOSE 100 (H) 04/04/2019   BUN 12 04/04/2019   CREATININE 1.02 04/04/2019   BILITOT 0.7 04/04/2019   ALKPHOS 108 04/04/2019   AST 20 04/04/2019   ALT 31 04/04/2019   PROT 7.7 04/04/2019   ALBUMIN 4.2 04/04/2019   CALCIUM 9.4 04/04/2019   ANIONGAP 12 04/04/2019   Lab Results  Component Value Date   CHOL 242 (H) 09/30/2019   Lab Results  Component Value Date   HDL 45  09/30/2019   Lab Results  Component Value Date   LDLCALC 178 (H) 09/30/2019   Lab Results  Component Value Date   TRIG 104 09/30/2019   Lab Results  Component Value Date   CHOLHDL 5.4 (H) 09/30/2019  No results found for: HGBA1C     Assessment & Plan:  Shawn Raymond was seen today for cerumen impaction.  Diagnoses and all orders for this visit:  Bilateral impacted cerumen -     Ear Lavage    No orders of the defined types were placed in this encounter.    Grayce Sessions, NP

## 2019-11-20 MED FILL — ALBUTEROL SULFATE HFA 108 (: 108 (90 BAS | 25 days supply | Qty: 18 | Fill #2

## 2019-11-20 MED FILL — OMEPRAZOLE DR 40 MG CAPSULE: 40 | 30 days supply | Qty: 30 | Fill #1

## 2019-11-20 MED FILL — METHOCARBAMOL 500 MG TABS: 500 | 20 days supply | Qty: 60 | Fill #1

## 2019-11-20 MED FILL — SUMATRIPTAN SUCC 50 MG TAB: 50 | 23 days supply | Qty: 9 | Fill #1

## 2019-11-26 MED FILL — MELOXICAM 15 MG TABLET: 15 | 30 days supply | Qty: 30 | Fill #1

## 2019-11-26 MED FILL — SYMBICORT 160-4.5 MCG INH: 160-4.5 | 30 days supply | Qty: 10 | Fill #1

## 2019-12-30 MED FILL — METHOCARBAMOL 500 MG TABS: 500 | 20 days supply | Qty: 60 | Fill #2

## 2019-12-30 MED FILL — SYMBICORT 160-4.5 MCG INH: 160-4.5 | 30 days supply | Qty: 10 | Fill #2

## 2019-12-30 MED FILL — ?SUMATRIPTAN SUCC 50 MG TAB: 50 | 23 days supply | Qty: 9 | Fill #1

## 2019-12-30 MED FILL — ALBUTEROL SULFATE HFA 108 (: 108 (90 BAS | 25 days supply | Qty: 18 | Fill #1

## 2019-12-30 MED FILL — OMEPRAZOLE DR 40 MG CAPSULE: 40 | 30 days supply | Qty: 30 | Fill #2

## 2020-01-27 ENCOUNTER — Other Ambulatory Visit (INDEPENDENT_AMBULATORY_CARE_PROVIDER_SITE_OTHER): Payer: Self-pay | Admitting: Primary Care

## 2020-01-27 ENCOUNTER — Other Ambulatory Visit (INDEPENDENT_AMBULATORY_CARE_PROVIDER_SITE_OTHER): Payer: Self-pay | Admitting: Family Medicine

## 2020-01-27 DIAGNOSIS — Z760 Encounter for issue of repeat prescription: Secondary | ICD-10-CM

## 2020-01-27 DIAGNOSIS — R12 Heartburn: Secondary | ICD-10-CM

## 2020-01-27 DIAGNOSIS — M5412 Radiculopathy, cervical region: Secondary | ICD-10-CM

## 2020-01-27 MED FILL — OMEPRAZOLE DR 40 MG CAPSULE: 40 | 30 days supply | Qty: 30 | Fill #0

## 2020-01-27 NOTE — Telephone Encounter (Signed)
Requested medication (s) are due for refill today - yes  Requested medication (s) are on the active medication list -yes  Future visit scheduled -yes  Last refill: 12/30/19  Notes to clinic: Request non delegated Rx  Requested Prescriptions  Pending Prescriptions Disp Refills   methocarbamol (ROBAXIN) 500 MG tablet [Pharmacy Med Name: METHOCARBAMOL 500 MG TABS 500 Tablet] 60 tablet 2    Sig: TAKE 1 TABLET (500 MG TOTAL) BY MOUTH 3 (THREE) TIMES DAILY.      Not Delegated - Analgesics:  Muscle Relaxants Failed - 01/27/2020 11:51 AM      Failed - This refill cannot be delegated      Passed - Valid encounter within last 6 months    Recent Outpatient Visits           2 months ago Bilateral impacted cerumen   [facility] Grayce Sessions, NP   2 months ago Umbilical hernia with obstruction, without gangrene   [facility] Grayce Sessions, NP   3 months ago Prostate cancer screening   [facility] Grayce Sessions, NP   4 months ago Essential hypertension   [facility] Grayce Sessions, NP   8 months ago Arthritis   [facility] Grayce Sessions, NP       Future Appointments             In 2 months Randa Evens, Kinnie Scales, NP [facility]                Requested Prescriptions  Pending Prescriptions Disp Refills   methocarbamol (ROBAXIN) 500 MG tablet [Pharmacy Med Name: METHOCARBAMOL 500 MG TABS 500 Tablet] 60 tablet 2    Sig: TAKE 1 TABLET (500 MG TOTAL) BY MOUTH 3 (THREE) TIMES DAILY.      Not Delegated - Analgesics:  Muscle Relaxants Failed - 01/27/2020 11:51 AM      Failed - This refill cannot be delegated      Passed - Valid encounter within last 6 months    Recent Outpatient Visits           2 months ago Bilateral impacted cerumen   [facility] Grayce Sessions, NP   2 months ago  Umbilical hernia with obstruction, without gangrene   [facility] Grayce Sessions, NP   3 months ago Prostate cancer screening   [facility] Grayce Sessions, NP   4 months ago Essential hypertension   [facility] Grayce Sessions, NP   8 months ago Arthritis   [facility] Grayce Sessions, NP       Future Appointments             In 2 months Randa Evens, Kinnie Scales, NP [facility]

## 2020-01-30 ENCOUNTER — Other Ambulatory Visit (INDEPENDENT_AMBULATORY_CARE_PROVIDER_SITE_OTHER): Payer: Self-pay | Admitting: Primary Care

## 2020-01-30 MED FILL — METHOCARBAMOL 500 MG TABS: 500 | 20 days supply | Qty: 60 | Fill #0

## 2020-02-02 MED FILL — ALBUTEROL SULFATE HFA 108 (: 108 (90 BAS | 25 days supply | Qty: 18 | Fill #2

## 2020-02-02 MED FILL — SYMBICORT 160-4.5 MCG INH: 160-4.5 | 30 days supply | Qty: 10 | Fill #3

## 2020-02-03 MED FILL — OMEPRAZOLE DR 40 MG CAPSULE: 40 | 30 days supply | Qty: 30 | Fill #0

## 2020-02-18 ENCOUNTER — Telehealth (INDEPENDENT_AMBULATORY_CARE_PROVIDER_SITE_OTHER): Payer: Self-pay | Admitting: Primary Care

## 2020-02-18 DIAGNOSIS — M545 Low back pain, unspecified: Secondary | ICD-10-CM

## 2020-02-18 NOTE — Progress Notes (Signed)
Virtual Visit via Telephone Note  I connected with Shawn Raymond on 02/18/20 at  3:50 PM EDT by telephone and verified that I am speaking with the correct person using two identifiers.  Location: Patient: home Provider: Grayce Sessions @[facility]   I discussed the limitations, risks, security and privacy concerns of performing an evaluation and management service by telephone and the availability of in person appointments. I also discussed with the patient that there may be a patient responsible charge related to this service. The patient expressed understanding and agreed to proceed.   History of Present Illness: [name] is a 39 year old male with complaints of pain  he is cervical lumbar area. He complained of pain she will down his legs and causing urinary incontinence.  Explained to him he needed to go to the emergency room for further work-up this has been taken place for the last 2 or 3 days. I stressed concern of Cauda Equina Syndrome. Is a  Surgical emergency.  Past Medical History:  Diagnosis Date   Asthma    Sarcoidosis    Current Outpatient Medications on File Prior to Visit  Medication Sig Dispense Refill   albuterol (VENTOLIN HFA) 108 (90 Base) MCG/ACT inhaler Take 1-2 puffs as needed for shortness of breath. This is not for daily use RESCUE INHALER 18 g 2   atorvastatin (LIPITOR) 40 MG tablet Take 1 tablet (40 mg total) by mouth daily. 90 tablet 3   budesonide-formoterol (SYMBICORT) 160-4.5 MCG/ACT inhaler Inhale 2 puffs into the lungs 2 (two) times daily. 1 Inhaler 11   meloxicam (MOBIC) 15 MG tablet Take 1 tablet (15 mg total) by mouth daily. 90 tablet 1   methocarbamol (ROBAXIN) 500 MG tablet TAKE 1 TABLET (500 MG TOTAL) BY MOUTH 3 (THREE) TIMES DAILY. 60 tablet 2   omeprazole (PRILOSEC) 40 MG capsule TAKE 1 CAPSULE (40 MG TOTAL) BY MOUTH DAILY. 30 capsule 2   SUMAtriptan (IMITREX) 50 MG tablet Take 1 tablet (50 mg total) by mouth every 2  (two) hours as needed for migraine. May repeat in 2 hours if headache persists or recurs. 9 tablet 1   No current facility-administered medications on file prior to visit.   Observations/Objective: Review of Systems  Genitourinary:       Urinary incontinence   Musculoskeletal: Positive for back pain.  Neurological: Positive for weakness.  All other systems reviewed and are negative.  Assessment and Plan: Diagnoses and all orders for this visit:  Acute bilateral low back pain, unspecified whether sciatica present BACK PAIN Location: cervical, lumbar/sacral Quality: dull, grinding, sharp, shooting and with paresthesia Onset: worsening Worse with: standing and walking       Better with: nothing  Radiation: down Trauma:  Best sitting/standing/leaning forward: no Red Flags Fecal/urinary incontinence: yes  Numbness/Weakness: yes  Fever/chills/sweats: no  Night pain: yes  Unexplained weight loss: no  No relief with bedrest: no  h/o cancer/immunosuppression: no  IV drug use: no  PMH of osteoporosis or chronic steroid use: no  ADVISED TO SEEK URGENT MEDICAL CARE Follow Up Instructions:    I discussed the assessment and treatment plan with the patient. The patient was provided an opportunity to ask questions and all were answered. The patient agreed with the plan and demonstrated an understanding of the instructions.   The patient was advised to call back or seek an in-person evaluation if the symptoms worsen or if the condition fails to improve as anticipated.  I provided  minutes of 15  non-face-to-face time during this encounter.   Grayce Sessions, NP

## 2020-02-25 ENCOUNTER — Other Ambulatory Visit (INDEPENDENT_AMBULATORY_CARE_PROVIDER_SITE_OTHER): Payer: Self-pay | Admitting: Primary Care

## 2020-02-25 DIAGNOSIS — Z760 Encounter for issue of repeat prescription: Secondary | ICD-10-CM

## 2020-02-25 DIAGNOSIS — J45909 Unspecified asthma, uncomplicated: Secondary | ICD-10-CM

## 2020-02-25 MED FILL — ALBUTEROL SULFATE HFA 108 (: 108 (90 BAS | 25 days supply | Qty: 18 | Fill #0

## 2020-02-25 MED FILL — METHOCARBAMOL 500 MG TABS: 500 | 20 days supply | Qty: 60 | Fill #1

## 2020-02-25 NOTE — Telephone Encounter (Signed)
Requested Prescriptions  Pending Prescriptions Disp Refills  . albuterol (VENTOLIN HFA) 108 (90 Base) MCG/ACT inhaler [Pharmacy Med Name: ALBUTEROL SULFATE HFA 108 ( 108 (90 BAS Aerosol] 18 g 2    Sig: TAKE 1-2 PUFFS AS NEEDED FOR SHORTNESS OF BREATH. THIS IS NOT FOR DAILY USE RESCUE INHALER     Pulmonology:  Beta Agonists Failed - 02/25/2020  9:16 AM      Failed - One inhaler should last at least one month. If the patient is requesting refills earlier, contact the patient to check for uncontrolled symptoms.      Passed - Valid encounter within last 12 months    Recent Outpatient Visits          1 week ago Acute bilateral low back pain, unspecified whether sciatica present   [facility] Grayce Sessions, NP   3 months ago Bilateral impacted cerumen   [facility] Grayce Sessions, NP   3 months ago Umbilical hernia with obstruction, without gangrene   [facility] Grayce Sessions, NP   4 months ago Prostate cancer screening   [facility] Grayce Sessions, NP   5 months ago Essential hypertension   [facility] Grayce Sessions, NP      Future Appointments            In 1 month Randa Evens, Kinnie Scales, NP [facility]

## 2020-03-03 MED FILL — OMEPRAZOLE DR 40 MG CAPSULE: 40 | 30 days supply | Qty: 30 | Fill #1

## 2020-03-03 MED FILL — SYMBICORT 160-4.5 MCG INH: 160-4.5 | 30 days supply | Qty: 10 | Fill #4

## 2020-03-24 ENCOUNTER — Other Ambulatory Visit (INDEPENDENT_AMBULATORY_CARE_PROVIDER_SITE_OTHER): Payer: Self-pay | Admitting: Primary Care

## 2020-03-24 DIAGNOSIS — J45909 Unspecified asthma, uncomplicated: Secondary | ICD-10-CM

## 2020-03-24 DIAGNOSIS — Z760 Encounter for issue of repeat prescription: Secondary | ICD-10-CM

## 2020-03-24 DIAGNOSIS — R12 Heartburn: Secondary | ICD-10-CM

## 2020-03-24 MED FILL — ALBUTEROL SULFATE HFA 108 (: 108 (90 BAS | 25 days supply | Qty: 18 | Fill #1

## 2020-03-24 NOTE — Telephone Encounter (Signed)
albuterol (VENTOLIN HFA) 108 (90 Base) MCG/ACT inhaler  omeprazole (PRILOSEC) 40 MG capsule  budesonide-formoterol (SYMBICORT) 160-4.5 MCG/ACT inhaler    Pt is requesting a refill for medications above.   Pharmacy: [facility] - [place], [place] [address]  [address] [place], [place] [address]

## 2020-03-24 NOTE — Telephone Encounter (Signed)
Per pharmacy these  have refill and are too early. Patient will be able to pick up on Monday. Patient has been notified

## 2020-03-26 MED FILL — ALBUTEROL SULFATE HFA 108 (: 108 (90 BAS | 25 days supply | Qty: 18 | Fill #2

## 2020-03-31 ENCOUNTER — Other Ambulatory Visit: Payer: Self-pay

## 2020-03-31 ENCOUNTER — Other Ambulatory Visit (INDEPENDENT_AMBULATORY_CARE_PROVIDER_SITE_OTHER): Payer: Self-pay | Admitting: Primary Care

## 2020-03-31 ENCOUNTER — Ambulatory Visit (INDEPENDENT_AMBULATORY_CARE_PROVIDER_SITE_OTHER): Payer: Self-pay | Admitting: Primary Care

## 2020-03-31 ENCOUNTER — Encounter (INDEPENDENT_AMBULATORY_CARE_PROVIDER_SITE_OTHER): Payer: Self-pay | Admitting: Primary Care

## 2020-03-31 VITALS — BP 136/96 | HR 58 | Temp 98.6°F | Ht 66.0 in | Wt 201.4 lb

## 2020-03-31 DIAGNOSIS — I10 Essential (primary) hypertension: Secondary | ICD-10-CM

## 2020-03-31 DIAGNOSIS — J45909 Unspecified asthma, uncomplicated: Secondary | ICD-10-CM

## 2020-03-31 DIAGNOSIS — M5412 Radiculopathy, cervical region: Secondary | ICD-10-CM

## 2020-03-31 DIAGNOSIS — Z760 Encounter for issue of repeat prescription: Secondary | ICD-10-CM

## 2020-03-31 DIAGNOSIS — R21 Rash and other nonspecific skin eruption: Secondary | ICD-10-CM

## 2020-03-31 DIAGNOSIS — R12 Heartburn: Secondary | ICD-10-CM

## 2020-03-31 DIAGNOSIS — E782 Mixed hyperlipidemia: Secondary | ICD-10-CM

## 2020-03-31 DIAGNOSIS — M545 Low back pain, unspecified: Secondary | ICD-10-CM

## 2020-03-31 MED ORDER — OMEPRAZOLE 40 MG PO CPDR: 40.0000 mg | DELAYED_RELEASE_CAPSULE | Freq: Every day | ORAL | 2 refills | Status: DC

## 2020-03-31 MED ORDER — TRIAMCINOLONE ACETONIDE 0.1 % EX CREA: 1.0000 "application " | TOPICAL_CREAM | Freq: Two times a day (BID) | CUTANEOUS | 0 refills | Status: DC

## 2020-03-31 MED ORDER — METHOCARBAMOL 500 MG PO TABS: 500.0000 mg | ORAL_TABLET | Freq: Three times a day (TID) | ORAL | 2 refills | Status: DC

## 2020-03-31 MED ORDER — ALBUTEROL SULFATE HFA 108 (90 BASE) MCG/ACT IN AERS: INHALATION_SPRAY | RESPIRATORY_TRACT | 2 refills | Status: DC

## 2020-03-31 MED FILL — OMEPRAZOLE DR 40 MG CAPSULE: 40 | 30 days supply | Qty: 30 | Fill #2

## 2020-03-31 MED FILL — SYMBICORT 160-4.5 MCG INH: 160-4.5 | 30 days supply | Qty: 10 | Fill #5

## 2020-03-31 MED FILL — TRIAMCINOLONE 0.1% CREAM: 0.1 | 15 days supply | Qty: 30 | Fill #0

## 2020-03-31 MED FILL — METHOCARBAMOL 500 MG TABS: 500 | 20 days supply | Qty: 60 | Fill #2

## 2020-03-31 NOTE — Progress Notes (Signed)
Established Patient Office Visit  Subjective:  Patient ID: Shawn Raymond, male    DOB: 1980-05-31  Age: 39 y.o. MRN: [id number]  CC:  Chief Complaint  Patient presents with   Blood Pressure Check   Weight Check   Rash    On face and nose     HPI Shawn Raymond is a 39 year old male who  presents for several concerns HTN management-Denies shortness of breath, headaches, chest pain or lower extremity edema. weight check and a rash that appeared on his face  4-5 days ago.Denies changing any changes in soaps , detergents and fabric softener.        Past Medical History:  Diagnosis Date   Asthma    Sarcoidosis     Past Surgical History:  Procedure Laterality Date   ANTERIOR CERVICAL DECOMP/DISCECTOMY FUSION N/A 04/02/2018   Procedure: ANTERIOR CERVICAL DECOMPRESSION/DISCECTOMY FUSION CERVICAL SIX- CERVICAL SEVEN ;  Surgeon: Lisbeth Renshaw, MD;  Location: [facility];  Service: Neurosurgery;  Laterality: N/A;   FEMUR FRACTURE SURGERY     PATELLA RECONSTRUCTION     POSTERIOR CERVICAL FUSION/FORAMINOTOMY N/A 04/02/2018   Procedure: CERVICAL THREE- CERVICAL FOUR, CERVICAL FOUR- CERVICAL FIVE, CERVICAL FIVE- CERVICAL SIX, CERVICAL SIX- CERVICAL SEVEN LAMINECTOMY WITH LATERAL MASS POSTERIOR SEGMENTAL INSTRUEMNTATION, POSTERIOR LATERAL ARTHRODESIS;  Surgeon: Lisbeth Renshaw, MD;  Location: [facility] OR;  Service: Neurosurgery;  Laterality: N/A;    Family History  Problem Relation Age of Onset   Healthy Mother    Healthy Father     Social History   Socioeconomic History   Marital status: Divorced    Spouse name: Not on file   Number of children: Not on file   Years of education: Not on file   Highest education level: Not on file  Occupational History   Not on file  Tobacco Use   Smoking status: Former Smoker    Packs/day: 0.70    Types: Cigarettes    Quit date: 12/24/2017    Years since quitting: 2.2   Smokeless tobacco: Never Used   Tobacco  comment: patient has been  without a cigarette since 12/27/17  Vaping Use   Vaping Use: Never used  Substance and Sexual Activity   Alcohol use: No   Drug use: Yes    Types: Marijuana    Comment: 3grams a day.    Sexual activity: Not on file  Other Topics Concern   Not on file  Social History Narrative   Not on file   Social Determinants of Health   Financial Resource Strain: Not on file  Food Insecurity: Not on file  Transportation Needs: Not on file  Physical Activity: Not on file  Stress: Not on file  Social Connections: Not on file  Intimate Partner Violence: Not on file    Outpatient Medications Prior to Visit  Medication Sig Dispense Refill   budesonide-formoterol (SYMBICORT) 160-4.5 MCG/ACT inhaler Inhale 2 puffs into the lungs 2 (two) times daily. 1 Inhaler 11   SUMAtriptan (IMITREX) 50 MG tablet Take 1 tablet (50 mg total) by mouth every 2 (two) hours as needed for migraine. May repeat in 2 hours if headache persists or recurs. 9 tablet 1   albuterol (VENTOLIN HFA) 108 (90 Base) MCG/ACT inhaler TAKE 1-2 PUFFS AS NEEDED FOR SHORTNESS OF BREATH. THIS IS NOT FOR DAILY USE RESCUE INHALER 18 g 2   meloxicam (MOBIC) 15 MG tablet Take 1 tablet (15 mg total) by mouth daily. 90 tablet 1   methocarbamol (  ROBAXIN) 500 MG tablet TAKE 1 TABLET (500 MG TOTAL) BY MOUTH 3 (THREE) TIMES DAILY. 60 tablet 2   omeprazole (PRILOSEC) 40 MG capsule TAKE 1 CAPSULE (40 MG TOTAL) BY MOUTH DAILY. 30 capsule 2   atorvastatin (LIPITOR) 40 MG tablet Take 1 tablet (40 mg total) by mouth daily. (Patient not taking: Reported on 03/31/2020) 90 tablet 3   No facility-administered medications prior to visit.    Allergies  Allergen Reactions   Iodine Anaphylaxis   Shellfish Allergy Anaphylaxis    ROS Review of Systems  Musculoskeletal: Positive for back pain and gait problem.  Skin: Positive for rash.  All other systems reviewed and are negative.     Objective:    Physical  Exam Vitals reviewed.  Constitutional:      Appearance: He is obese.  HENT:     Head: Normocephalic.     Right Ear: External ear normal.     Left Ear: External ear normal.     Nose: Nose normal.  Eyes:     Extraocular Movements: Extraocular movements intact.  Cardiovascular:     Rate and Rhythm: Normal rate and regular rhythm.  Pulmonary:     Effort: Pulmonary effort is normal.     Breath sounds: Normal breath sounds.  Abdominal:     General: Bowel sounds are normal. There is distension.     Palpations: Abdomen is soft.  Musculoskeletal:        General: Normal range of motion.     Cervical back: Normal range of motion and neck supple.  Skin:    General: Skin is warm and dry.  Neurological:     Mental Status: He is alert and oriented to person, place, and time.  Psychiatric:        Mood and Affect: Mood normal.        Behavior: Behavior normal.        Thought Content: Thought content normal.        Judgment: Judgment normal.     BP (!) 136/96 (BP Location: Right Arm, Patient Position: Supine, Cuff Size: Large)    Pulse (!) 58    Temp 98.6 F (37 C) (Temporal)    Ht 5\' 6"  (1.676 m)    Wt 201 lb 6.4 oz (91.4 kg)    SpO2 96%    BMI 32.51 kg/m  Wt Readings from Last 3 Encounters:  03/31/20 201 lb 6.4 oz (91.4 kg)  11/14/19 197 lb (89.4 kg)  10/31/19 198 lb (89.8 kg)     There are no preventive care reminders to display for this patient.  There are no preventive care reminders to display for this patient.  Lab Results  Component Value Date   TSH 2.220 02/08/2018   Lab Results  Component Value Date   WBC 15.9 (H) 09/30/2019   HGB 14.1 09/30/2019   HCT 43.0 09/30/2019   MCV 87 09/30/2019   PLT 259 09/30/2019   Lab Results  Component Value Date   NA 139 04/04/2019   K 3.8 04/04/2019   CO2 26 04/04/2019   GLUCOSE 100 (H) 04/04/2019   BUN 12 04/04/2019   CREATININE 1.02 04/04/2019   BILITOT 0.7 04/04/2019   ALKPHOS 108 04/04/2019   AST 20 04/04/2019   ALT  31 04/04/2019   PROT 7.7 04/04/2019   ALBUMIN 4.2 04/04/2019   CALCIUM 9.4 04/04/2019   ANIONGAP 12 04/04/2019   Lab Results  Component Value Date   CHOL 242 (H) 09/30/2019   Lab  Results  Component Value Date   HDL 45 09/30/2019   Lab Results  Component Value Date   LDLCALC 178 (H) 09/30/2019   Lab Results  Component Value Date   TRIG 104 09/30/2019   Lab Results  Component Value Date   CHOLHDL 5.4 (H) 09/30/2019   No results found for: HGBA1C    Assessment & Plan:  [place] was seen today for blood pressure check, weight check and rash.  Diagnoses and all orders for this visit: Mitsuo was seen today for blood pressure check, weight check and rash.  Diagnoses and all orders for this visit:  Acute bilateral low back pain, unspecified whether sciatica present  Essential hypertension Counseled on blood pressure goal of less than 130/80, low-sodium, DASH diet, medication compliance, 150 minutes of moderate intensity exercise per week. Discussed medication compliance, adverse effects.  Rash and nonspecific skin eruption -     triamcinolone (KENALOG) 0.1 %; Apply 1 application topically 2 (two) times daily.  Refill clinic medication management patient -     methocarbamol (ROBAXIN) 500 MG tablet; Take 1 tablet (500 mg total) by mouth 3 (three) times daily. -     omeprazole (PRILOSEC) 40 MG capsule; Take 1 capsule (40 mg total) by mouth daily. -     albuterol (VENTOLIN HFA) 108 (90 Base) MCG/ACT inhaler; Take 2 puff every 6 hrs as needed for wheezing  Mixed hyperlipidemia Decrease your fatty foods, red meat, cheese, milk and increase fiber like whole grains and veggies. You can also add a fiber supplement like Metamucil or Benefiber.   Cervical radiculopathy -     methocarbamol (ROBAXIN) 500 MG tablet; Take 1 tablet (500 mg total) by mouth 3 (three) times daily.  Heartburn -     omeprazole (PRILOSEC) 40 MG capsule; Take 1 capsule (40 mg total) by mouth  daily.  Uncomplicated asthma, unspecified asthma severity, unspecified whether persistent -     albuterol (VENTOLIN HFA) 108 (90 Base) MCG/ACT inhaler; Take 2 puff every 6 hrs as needed for wheezing      Meds ordered this encounter  Medications   methocarbamol (ROBAXIN) 500 MG tablet    Sig: Take 1 tablet (500 mg total) by mouth 3 (three) times daily.    Dispense:  60 tablet    Refill:  2   omeprazole (PRILOSEC) 40 MG capsule    Sig: Take 1 capsule (40 mg total) by mouth daily.    Dispense:  30 capsule    Refill:  2   albuterol (VENTOLIN HFA) 108 (90 Base) MCG/ACT inhaler    Sig: Take 2 puff every 6 hrs as needed for wheezing    Dispense:  18 g    Refill:  2   triamcinolone (KENALOG) 0.1 %    Sig: Apply 1 application topically 2 (two) times daily.    Dispense:  30 g    Refill:  0    Follow-up: No follow-ups on file.    Grayce Sessions, NP

## 2020-03-31 NOTE — Patient Instructions (Signed)

## 2020-04-14 ENCOUNTER — Ambulatory Visit: Payer: Self-pay | Attending: Primary Care

## 2020-04-14 ENCOUNTER — Other Ambulatory Visit: Payer: Self-pay

## 2020-04-19 ENCOUNTER — Other Ambulatory Visit (INDEPENDENT_AMBULATORY_CARE_PROVIDER_SITE_OTHER): Payer: Self-pay | Admitting: Primary Care

## 2020-04-19 DIAGNOSIS — G43109 Migraine with aura, not intractable, without status migrainosus: Secondary | ICD-10-CM

## 2020-04-19 MED FILL — SUMATRIPTAN SUCC 50 MG TAB: 50 | 23 days supply | Qty: 9 | Fill #0

## 2020-04-27 MED FILL — OMEPRAZOLE DR 40 MG CAPSULE: 40 | 30 days supply | Qty: 30 | Fill #0

## 2020-04-27 MED FILL — $Symbicort 160-4.5mcg/act: 160-4.5 | 30 days supply | Qty: 1 | Fill #6

## 2020-04-27 MED FILL — METHOCARBAMOL 500 MG TABS: 500 | 20 days supply | Qty: 60 | Fill #0

## 2020-04-27 MED FILL — ALBUTEROL SULFATE HFA 108 (: 108 (90 BAS | 25 days supply | Qty: 18 | Fill #0

## 2020-05-26 MED FILL — $Symbicort 160-4.5mcg/act: 160-4.5 | 30 days supply | Qty: 1 | Fill #7

## 2020-05-26 MED FILL — OMEPRAZOLE DR 40 MG CAPSULE: 40 | 30 days supply | Qty: 30 | Fill #1

## 2020-05-26 MED FILL — ALBUTEROL SULFATE HFA 108 (: 108 (90 BAS | 25 days supply | Qty: 18 | Fill #1

## 2020-05-26 MED FILL — METHOCARBAMOL 500 MG TABS: 500 | 20 days supply | Qty: 60 | Fill #1

## 2020-07-26 ENCOUNTER — Other Ambulatory Visit (INDEPENDENT_AMBULATORY_CARE_PROVIDER_SITE_OTHER): Payer: Self-pay | Admitting: Primary Care

## 2020-07-26 ENCOUNTER — Other Ambulatory Visit: Payer: Self-pay

## 2020-07-26 DIAGNOSIS — M5412 Radiculopathy, cervical region: Secondary | ICD-10-CM

## 2020-07-26 DIAGNOSIS — Z760 Encounter for issue of repeat prescription: Secondary | ICD-10-CM

## 2020-07-26 DIAGNOSIS — J45909 Unspecified asthma, uncomplicated: Secondary | ICD-10-CM

## 2020-07-26 DIAGNOSIS — R12 Heartburn: Secondary | ICD-10-CM

## 2020-07-26 MED ORDER — BUDESONIDE-FORMOTEROL FUMARATE 160-4.5 MCG/ACT IN AERO
2.0000 | INHALATION_SPRAY | Freq: Two times a day (BID) | RESPIRATORY_TRACT | 1 refills | Status: DC
  Filled 2020-07-26: qty 10.2, 30d supply, fill #0
  Filled 2020-09-07: qty 10.2, 30d supply, fill #1

## 2020-07-26 MED ORDER — OMEPRAZOLE 40 MG PO CPDR
DELAYED_RELEASE_CAPSULE | Freq: Every day | ORAL | 2 refills | Status: DC
  Filled 2020-07-26: qty 30, 30d supply, fill #0
  Filled 2020-09-07: qty 30, 30d supply, fill #1

## 2020-07-26 NOTE — Telephone Encounter (Signed)
Requested medication (s) are due for refill today:  yes  Requested medication (s) are on the active medication list: yes  Last refill: 06/28/2020  Future visit scheduled:no  Notes to clinic:  this refill cannot be delegated    Requested Prescriptions  Pending Prescriptions Disp Refills   albuterol (VENTOLIN HFA) 108 (90 Base) MCG/ACT inhaler 18 g 2    Sig: TAKE 2 PUFF EVERY 6 HRS AS NEEDED FOR WHEEZING      Pulmonology:  Beta Agonists Failed - 07/26/2020 10:02 AM      Failed - One inhaler should last at least one month. If the patient is requesting refills earlier, contact the patient to check for uncontrolled symptoms.      Passed - Valid encounter within last 12 months    Recent Outpatient Visits           3 months ago Acute bilateral low back pain, unspecified whether sciatica present   [facility] Gwinda Passe P, NP   5 months ago Acute bilateral low back pain, unspecified whether sciatica present   [facility] Grayce Sessions, NP   8 months ago Bilateral impacted cerumen   [facility] Gwinda Passe P, NP   8 months ago Umbilical hernia with obstruction, without gangrene   [facility] Gwinda Passe P, NP   10 months ago Prostate cancer screening   [facility] Edwards, Michelle P, NP                  methocarbamol (ROBAXIN) 500 MG tablet 60 tablet 2    Sig: TAKE 1 TABLET (500 MG TOTAL) BY MOUTH 3 (THREE) TIMES DAILY.      Not Delegated - Analgesics:  Muscle Relaxants Failed - 07/26/2020 10:02 AM      Failed - This refill cannot be delegated      Passed - Valid encounter within last 6 months    Recent Outpatient Visits           3 months ago Acute bilateral low back pain, unspecified whether sciatica present   [facility] Gwinda Passe P, NP   5 months ago Acute bilateral low back pain, unspecified whether  sciatica present   [facility] Grayce Sessions, NP   8 months ago Bilateral impacted cerumen   [facility] Grayce Sessions, NP   8 months ago Umbilical hernia with obstruction, without gangrene   [facility] Grayce Sessions, NP   10 months ago Prostate cancer screening   [facility] Grayce Sessions, NP                 Signed Prescriptions Disp Refills   omeprazole (PRILOSEC) 40 MG capsule 30 capsule 2    Sig: TAKE 1 CAPSULE (40 MG TOTAL) BY MOUTH DAILY.      Gastroenterology: Proton Pump Inhibitors Passed - 07/26/2020 10:02 AM      Passed - Valid encounter within last 12 months    Recent Outpatient Visits           3 months ago Acute bilateral low back pain, unspecified whether sciatica present   [facility] Gwinda Passe P, NP   5 months ago Acute bilateral low back pain, unspecified whether sciatica present   [facility] Grayce Sessions, NP  8 months ago Bilateral impacted cerumen   [facility] Grayce Sessions, NP   8 months ago Umbilical hernia with obstruction, without gangrene   [facility] Grayce Sessions, NP   10 months ago Prostate cancer screening   [facility] Grayce Sessions, NP

## 2020-07-28 ENCOUNTER — Other Ambulatory Visit: Payer: Self-pay

## 2020-07-28 MED ORDER — METHOCARBAMOL 500 MG PO TABS
ORAL_TABLET | Freq: Three times a day (TID) | ORAL | 2 refills | Status: DC
  Filled 2020-07-28 – 2020-08-09 (×2): qty 60, 20d supply, fill #0
  Filled 2020-09-07: qty 60, 20d supply, fill #1

## 2020-07-28 MED ORDER — ALBUTEROL SULFATE HFA 108 (90 BASE) MCG/ACT IN AERS
INHALATION_SPRAY | RESPIRATORY_TRACT | 2 refills | Status: DC
  Filled 2020-07-28 – 2020-08-09 (×2): qty 18, 25d supply, fill #0
  Filled 2020-09-07: qty 18, 25d supply, fill #1
  Filled 2020-09-29: qty 18, 25d supply, fill #2

## 2020-07-29 ENCOUNTER — Other Ambulatory Visit: Payer: Self-pay

## 2020-08-05 ENCOUNTER — Other Ambulatory Visit: Payer: Self-pay

## 2020-08-09 ENCOUNTER — Other Ambulatory Visit: Payer: Self-pay

## 2020-08-10 ENCOUNTER — Other Ambulatory Visit: Payer: Self-pay

## 2020-08-13 ENCOUNTER — Other Ambulatory Visit: Payer: Self-pay

## 2020-09-01 ENCOUNTER — Ambulatory Visit (INDEPENDENT_AMBULATORY_CARE_PROVIDER_SITE_OTHER): Payer: Self-pay | Admitting: Primary Care

## 2020-09-02 ENCOUNTER — Other Ambulatory Visit: Payer: Self-pay

## 2020-09-07 ENCOUNTER — Other Ambulatory Visit: Payer: Self-pay

## 2020-09-07 ENCOUNTER — Other Ambulatory Visit (INDEPENDENT_AMBULATORY_CARE_PROVIDER_SITE_OTHER): Payer: Self-pay | Admitting: Primary Care

## 2020-09-07 DIAGNOSIS — Z760 Encounter for issue of repeat prescription: Secondary | ICD-10-CM

## 2020-09-07 DIAGNOSIS — J45909 Unspecified asthma, uncomplicated: Secondary | ICD-10-CM

## 2020-09-07 MED ORDER — BUDESONIDE-FORMOTEROL FUMARATE 160-4.5 MCG/ACT IN AERO
2.0000 | INHALATION_SPRAY | Freq: Two times a day (BID) | RESPIRATORY_TRACT | 11 refills | Status: DC
  Filled 2020-09-07: qty 10.2, 30d supply, fill #0

## 2020-09-07 MED FILL — Sumatriptan Succinate Tab 50 MG: ORAL | 30 days supply | Qty: 9 | Fill #0 | Status: AC

## 2020-09-07 NOTE — Telephone Encounter (Signed)
Requested Prescriptions  Pending Prescriptions Disp Refills  . budesonide-formoterol (SYMBICORT) 160-4.5 MCG/ACT inhaler 10.6 each 11    Sig: Inhale 2 puffs into the lungs 2 (two) times daily.     Pulmonology:  Combination Products Passed - 09/07/2020  7:14 AM      Passed - Valid encounter within last 12 months    Recent Outpatient Visits          5 months ago Acute bilateral low back pain, unspecified whether sciatica present   [facility] Gwinda Passe P, NP   6 months ago Acute bilateral low back pain, unspecified whether sciatica present   [facility] Grayce Sessions, NP   9 months ago Bilateral impacted cerumen   [facility] Grayce Sessions, NP   10 months ago Umbilical hernia with obstruction, without gangrene   [facility] Grayce Sessions, NP   11 months ago Prostate cancer screening   [facility] Grayce Sessions, NP

## 2020-09-08 ENCOUNTER — Other Ambulatory Visit: Payer: Self-pay

## 2020-09-29 ENCOUNTER — Other Ambulatory Visit: Payer: Self-pay

## 2020-09-30 ENCOUNTER — Other Ambulatory Visit: Payer: Self-pay

## 2020-09-30 ENCOUNTER — Encounter (INDEPENDENT_AMBULATORY_CARE_PROVIDER_SITE_OTHER): Payer: Self-pay | Admitting: Primary Care

## 2020-09-30 ENCOUNTER — Ambulatory Visit (INDEPENDENT_AMBULATORY_CARE_PROVIDER_SITE_OTHER): Payer: Self-pay | Admitting: Primary Care

## 2020-09-30 VITALS — BP 119/84 | Resp 16 | Wt 191.0 lb

## 2020-09-30 DIAGNOSIS — M542 Cervicalgia: Secondary | ICD-10-CM

## 2020-09-30 DIAGNOSIS — M5412 Radiculopathy, cervical region: Secondary | ICD-10-CM

## 2020-09-30 DIAGNOSIS — R12 Heartburn: Secondary | ICD-10-CM

## 2020-09-30 DIAGNOSIS — K420 Umbilical hernia with obstruction, without gangrene: Secondary | ICD-10-CM

## 2020-09-30 DIAGNOSIS — Z6830 Body mass index (BMI) 30.0-30.9, adult: Secondary | ICD-10-CM

## 2020-09-30 DIAGNOSIS — J45909 Unspecified asthma, uncomplicated: Secondary | ICD-10-CM

## 2020-09-30 DIAGNOSIS — E6609 Other obesity due to excess calories: Secondary | ICD-10-CM

## 2020-09-30 DIAGNOSIS — Z760 Encounter for issue of repeat prescription: Secondary | ICD-10-CM

## 2020-09-30 DIAGNOSIS — M62830 Muscle spasm of back: Secondary | ICD-10-CM

## 2020-09-30 DIAGNOSIS — R32 Unspecified urinary incontinence: Secondary | ICD-10-CM

## 2020-09-30 MED ORDER — OMEPRAZOLE 40 MG PO CPDR
DELAYED_RELEASE_CAPSULE | Freq: Every day | ORAL | 2 refills | Status: DC
  Filled 2020-09-30: qty 30, 30d supply, fill #0
  Filled 2020-11-01: qty 30, 30d supply, fill #1
  Filled 2020-12-02: qty 30, 30d supply, fill #2

## 2020-09-30 MED ORDER — METHOCARBAMOL 500 MG PO TABS
ORAL_TABLET | Freq: Three times a day (TID) | ORAL | 2 refills | Status: DC
  Filled 2020-09-30: qty 60, 20d supply, fill #0
  Filled 2020-11-01: qty 60, 20d supply, fill #1
  Filled 2020-12-02: qty 60, 20d supply, fill #2

## 2020-09-30 MED ORDER — ALBUTEROL SULFATE HFA 108 (90 BASE) MCG/ACT IN AERS
INHALATION_SPRAY | RESPIRATORY_TRACT | 2 refills | Status: DC
  Filled 2020-09-30: qty 18, fill #0
  Filled 2020-11-01: qty 18, 25d supply, fill #0
  Filled 2020-12-02: qty 18, 25d supply, fill #1
  Filled 2020-12-28: qty 18, 25d supply, fill #2

## 2020-09-30 MED ORDER — BUDESONIDE-FORMOTEROL FUMARATE 160-4.5 MCG/ACT IN AERO
2.0000 | INHALATION_SPRAY | Freq: Two times a day (BID) | RESPIRATORY_TRACT | 1 refills | Status: DC
  Filled 2020-09-30: qty 10.2, 30d supply, fill #0
  Filled 2020-11-01 – 2020-11-02 (×2): qty 10.2, 30d supply, fill #1

## 2020-09-30 NOTE — Progress Notes (Signed)
Back pain Abdominal Hernia  Excedrin for pain Pain 8/10  Constant

## 2020-09-30 NOTE — Patient Instructions (Signed)
Although PPIs have had an encouraging safety profile, recent studies regarding the long-term use of PPI medications have noted potential adverse effects, including risk of fractures, pneumonia, Clostridium difficile diarrhea, hypomagnesemia, vitamin B12 deficiency, chronic kidney disease, and dementia

## 2020-09-30 NOTE — Progress Notes (Addendum)
[facility]  Subjective: CC: back pain PCP: Grayce Sessions, NP HPI: Shawn Raymond is a 40 y.o. male presenting to clinic today for back pain. Concerns today include:  1. Back Pain Shawn Raymond reports that pain began 3 years ago .  3 years  h/o back pain.  Pain is a 8/10.  It legs to feet (right only)  radiate.  Standing and sitting  worsens pain.  Lying down messages  improves pain.  Shawn Raymond has been taking Excedrin and muscle relaxer  for pain with little  relief.  Shawn Raymond denies trauma or injury.  Denies dysuria, hematuria, fevers, chills, nausea, vomiting, abdominal pain, renal stones. Aggravating factors: certain movements and prolonged walking/standing. Alleviating factors: rest. Progressive LE weakness or saddle anesthesia:none. Extremity sensation changes or weakness: none. Ambulatory without difficulty. Problems with bowel and bladder habits: yes; without urinary retention. Normal PO intake without n/v. No associated abdominal pain/n/v. Self treatment: has OTC analgesics, with minimal relief. Shawn Raymond has urinary retention/incontinence, bowel incontinence, weakness, falls, sensation changes or pain anywhere else. Cervical 2020 h/o surgeries.He expressed that I could not lift my right arm over my head and the constant pain.    Current Outpatient Medications:    albuterol (VENTOLIN HFA) 108 (90 Base) MCG/ACT inhaler, TAKE 2 PUFF EVERY 6 HRS AS NEEDED FOR WHEEZING, Disp: 18 g, Rfl: 2   budesonide-formoterol (SYMBICORT) 160-4.5 MCG/ACT inhaler, Inhale 2 puffs into the lungs 2 (two) times daily., Disp: 10.2 g, Rfl: 1   methocarbamol (ROBAXIN) 500 MG tablet, TAKE 1 TABLET (500 MG TOTAL) BY MOUTH 3 (THREE) TIMES DAILY., Disp: 60 tablet, Rfl: 2   omeprazole (PRILOSEC) 40 MG capsule, TAKE 1 CAPSULE (40 MG TOTAL) BY MOUTH DAILY., Disp: 30 capsule, Rfl: 2 Allergies  Allergen Reactions   Iodine Anaphylaxis   Shellfish Allergy Anaphylaxis    Past Medical History:  Diagnosis Date    Asthma    Sarcoidosis    Social History   Socioeconomic History   Marital status: Divorced    Spouse name: Not on file   Number of children: Not on file   Years of education: Not on file   Highest education level: Not on file  Occupational History   Not on file  Tobacco Use   Smoking status: Former    Packs/day: 0.70    Pack years: 0.00    Types: Cigarettes    Quit date: 12/24/2017    Years since quitting: 2.7   Smokeless tobacco: Never   Tobacco comments:    Shawn Raymond has been  without a cigarette since 12/27/17  Vaping Use   Vaping Use: Never used  Substance and Sexual Activity   Alcohol use: No   Drug use: Yes    Types: Marijuana    Comment: 3grams a day.    Sexual activity: Not on file  Other Topics Concern   Not on file  Social History Narrative   Not on file   Social Determinants of Health   Financial Resource Strain: Not on file  Food Insecurity: Not on file  Transportation Needs: Not on file  Physical Activity: Not on file  Stress: Not on file  Social Connections: Not on file  Intimate Partner Violence: Not on file   Past Surgical History:  Procedure Laterality Date   ANTERIOR CERVICAL DECOMP/DISCECTOMY FUSION N/A 04/02/2018   Procedure: ANTERIOR CERVICAL DECOMPRESSION/DISCECTOMY FUSION CERVICAL SIX- CERVICAL SEVEN ;  Surgeon: Lisbeth Renshaw, MD;  Location: [facility] OR;  Service: Neurosurgery;  Laterality: N/A;   FEMUR FRACTURE  SURGERY     PATELLA RECONSTRUCTION     POSTERIOR CERVICAL FUSION/FORAMINOTOMY N/A 04/02/2018   Procedure: CERVICAL THREE- CERVICAL FOUR, CERVICAL FOUR- CERVICAL FIVE, CERVICAL FIVE- CERVICAL SIX, CERVICAL SIX- CERVICAL SEVEN LAMINECTOMY WITH LATERAL MASS POSTERIOR SEGMENTAL INSTRUEMNTATION, POSTERIOR LATERAL ARTHRODESIS;  Surgeon: Lisbeth Renshaw, MD;  Location: [facility] OR;  Service: Neurosurgery;  Laterality: N/A;    ROS: per HPI  Objective: Office vital signs reviewed. BP 119/84   Resp 16   Wt 191 lb (86.6 kg)   BMI 30.83 kg/m    Physical Examination:  General: Awake, alert, obese male  nourished, NAD Cardio: Regular rate and rhythm, S1S2 heard, no murmurs appreciated Pulm: Clear to auscultation bilaterally, no wheezes, rhonchi or rales Abdominal umbilical hernia  Extremities: Warm, well-perfused. No edema, cyanosis or clubbing; MSK: unstable  gait and station, decrease ROM right side  No palpable bony deformities,  negative straight leg test Neuro: 5/5 lower extremity strength;  Assessment/ Plan: Diagnoses and all orders for this visit:  Umbilical hernia with obstruction, without gangrene -     Ambulatory referral to General Surgery  Urinary incontinence, unspecified type Unable to clarify before or after neck and back - refer to urology   Class 1 obesity due to excess calories without serious comorbidity with body mass index (BMI) of 30.0 to 30.9 in adult Discussed diet and exercise for person with BMI >25. Instructed: You must burn more calories than you eat. Losing 5 percent of your body weight should be considered a success. In the longer term, losing more than 15 percent of your body weight and staying at this weight is an extremely good result. However, keep in mind that even losing 5 percent of your body weight leads to important health benefits, so try not to get discouraged if you're not able to lose more than this. Will recheck weight in 3-6 months.   Cervicalgia -     Ambulatory referral to Orthopedic Surgery  Muscle spasm of back -     Ambulatory referral to Orthopedic Surgery  Refill clinic medication management Shawn Raymond -     albuterol (VENTOLIN HFA) 108 (90 Base) MCG/ACT inhaler; TAKE 2 PUFF EVERY 6 HRS AS NEEDED FOR WHEEZING -     methocarbamol (ROBAXIN) 500 MG tablet; TAKE 1 TABLET (500 MG TOTAL) BY MOUTH 3 (THREE) TIMES DAILY. -     omeprazole (PRILOSEC) 40 MG capsule; TAKE 1 CAPSULE (40 MG TOTAL) BY MOUTH DAILY.  Uncomplicated asthma, unspecified asthma severity, unspecified whether  persistent -     albuterol (VENTOLIN HFA) 108 (90 Base) MCG/ACT inhaler; TAKE 2 PUFF EVERY 6 HRS AS NEEDED FOR WHEEZING  Cervical radiculopathy -     methocarbamol (ROBAXIN) 500 MG tablet; TAKE 1 TABLET (500 MG TOTAL) BY MOUTH 3 (THREE) TIMES DAILY.  Heartburn Discussed eating small frequent meal, reduction in acidic foods, fried foods ,spicy foods, alcohol caffeine and tobacco and certain medications. Avoid laying down after eating 55mins-1hour, elevated head of the bed. Discussed over use of PPI and place on AVS for reminder -     omeprazole (PRILOSEC) 40 MG capsule; TAKE 1 CAPSULE (40 MG TOTAL) BY MOUTH DAILY.  Other orders -     budesonide-formoterol (SYMBICORT) 160-4.5 MCG/ACT inhaler; Inhale 2 puffs into the lungs 2 (two) times daily.    Return in about 3 months (around 12/31/2020).   The above assessment and management plan was discussed with the Shawn Raymond. The Shawn Raymond verbalized understanding of and has agreed to the management plan. Shawn Raymond is  aware to call the clinic if symptoms persist or worsen. Shawn Raymond is aware when to return to the clinic for a follow-up visit. Shawn Raymond educated on when it is appropriate to go to the emergency department.   This note has been created with Education officer, environmental. Any transcriptional errors are unintentional.   Grayce Sessions, NP 10/10/2020, 9:55 PM

## 2020-10-01 ENCOUNTER — Other Ambulatory Visit: Payer: Self-pay

## 2020-10-11 ENCOUNTER — Encounter: Payer: Self-pay | Admitting: Family Medicine

## 2020-10-11 ENCOUNTER — Other Ambulatory Visit: Payer: Self-pay

## 2020-10-11 ENCOUNTER — Ambulatory Visit (INDEPENDENT_AMBULATORY_CARE_PROVIDER_SITE_OTHER): Payer: Self-pay | Admitting: Family Medicine

## 2020-10-11 VITALS — Ht 66.75 in | Wt 195.0 lb

## 2020-10-11 DIAGNOSIS — M546 Pain in thoracic spine: Secondary | ICD-10-CM

## 2020-10-11 DIAGNOSIS — M545 Low back pain, unspecified: Secondary | ICD-10-CM

## 2020-10-11 DIAGNOSIS — M542 Cervicalgia: Secondary | ICD-10-CM

## 2020-10-11 DIAGNOSIS — G8929 Other chronic pain: Secondary | ICD-10-CM

## 2020-10-11 NOTE — Progress Notes (Signed)
Office Visit Note   Patient: Shawn Raymond           Date of Birth: March 27, 1981           MRN: [id number] Visit Date: 10/11/2020 Requested by: Grayce Sessions, NP [address] [place],  [place] [address] PCP: Grayce Sessions, NP  Subjective: Chief Complaint  Patient presents with   neck to back pain    History of cervical spine surgery two years ago. Patient now experiencing right sided neck and arm pain x 2-3 months. Both hands burn.  Does not feel that he can grip as well. Also complains of pain down the spine and into low back. Pain and numbness on the medial side of his right lower leg. He is unable to hold his urine. Denies difficulty holding his bowels.    HPI: He is here with back pain.  He underwent 4 level cervical laminectomy a couple years ago for severe stenosis.  He has continued to have pain, spasms in his right arm, pain between the shoulder blades and pain in the low back.  He has been to the ER a couple times.  He had an MRI recently showing no cauda equina syndrome.  He was complaining of urinary incontinence.  He had an MRI in November as well.  He went to physical therapy for a little bit after his neck surgery but had to stop it due to COVID-19.  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               ROS:   All other systems were reviewed and are negative.  Objective: Vital Signs: Ht 5' 6.75" (1.695 m)   Wt 195 lb (88.5 kg)   BMI 30.77 kg/m   Physical Exam:  General:  Alert and oriented, in no acute distress. Pulm:  Breathing unlabored. Psy:  Normal mood, congruent affect.  Neck: He has limited range of motion symmetrically.  Upper extremity strength and reflexes are basically normal. Back: He has tender trigger points in the paraspinous muscles of the thoracic spine, midportion. Negative bilateral straight leg raise, lower extremity strength and reflexes are still normal.  Imaging: No results  found.  Assessment & Plan: Chronic neck and back pain with a component of myofascial pain -Discussed options and elected to try chiropractic in [place].  Physical therapy if this does not help.     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   INTRINSIC ASTHMA, UNSPECIFIED 03/23/2009   Nonspecific (abnormal) findings on radiological and other examination of body structure 01/07/2009   CT, CHEST, ABNORMAL 01/07/2009   Past Medical History:  Diagnosis Date   Asthma    Sarcoidosis     Family History  Problem Relation Age of Onset   Healthy Mother    Healthy Father     Past Surgical History:  Procedure Laterality Date   ANTERIOR CERVICAL DECOMP/DISCECTOMY FUSION N/A 04/02/2018   Procedure: ANTERIOR CERVICAL DECOMPRESSION/DISCECTOMY FUSION CERVICAL SIX- CERVICAL SEVEN ;  Surgeon: Lisbeth Renshaw, MD;  Location: [facility] OR;  Service: Neurosurgery;  Laterality: N/A;   FEMUR FRACTURE SURGERY     PATELLA RECONSTRUCTION     POSTERIOR CERVICAL FUSION/FORAMINOTOMY N/A 04/02/2018   Procedure: CERVICAL THREE- CERVICAL FOUR, CERVICAL FOUR- CERVICAL FIVE, CERVICAL FIVE- CERVICAL SIX, CERVICAL SIX- CERVICAL SEVEN LAMINECTOMY WITH LATERAL MASS POSTERIOR SEGMENTAL INSTRUEMNTATION, POSTERIOR LATERAL ARTHRODESIS;  Surgeon: Lisbeth Renshaw, MD;  Location: [facility] OR;  Service: Neurosurgery;  Laterality: N/A;   Social History   Occupational History   Not on file  Tobacco Use   Smoking status: Former    Packs/day: 0.70    Pack years: 0.00     Types: Cigarettes    Quit date: 12/24/2017    Years since quitting: 2.8   Smokeless tobacco: Never   Tobacco comments:    patient has been  without a cigarette since 12/27/17  Vaping Use   Vaping Use: Never used  Substance and Sexual Activity   Alcohol use: No   Drug use: Yes    Types: Marijuana    Comment: 3grams a day.    Sexual activity: Not on file

## 2020-10-14 ENCOUNTER — Encounter (INDEPENDENT_AMBULATORY_CARE_PROVIDER_SITE_OTHER): Payer: Self-pay | Admitting: Primary Care

## 2020-10-14 ENCOUNTER — Encounter (INDEPENDENT_AMBULATORY_CARE_PROVIDER_SITE_OTHER): Payer: Self-pay

## 2020-10-21 ENCOUNTER — Other Ambulatory Visit: Payer: Self-pay

## 2020-10-21 ENCOUNTER — Ambulatory Visit (HOSPITAL_COMMUNITY)
Admission: EM | Admit: 2020-10-21 | Discharge: 2020-10-21 | Disposition: A | Discharge status: Home / Self Care | Payer: Self-pay | Attending: Student | Admitting: Student

## 2020-10-21 ENCOUNTER — Encounter (HOSPITAL_COMMUNITY): Payer: Self-pay

## 2020-10-21 DIAGNOSIS — R2241 Localized swelling, mass and lump, right lower limb: Secondary | ICD-10-CM

## 2020-10-21 DIAGNOSIS — S90861A Insect bite (nonvenomous), right foot, initial encounter: Secondary | ICD-10-CM

## 2020-10-21 DIAGNOSIS — W57XXXA Bitten or stung by nonvenomous insect and other nonvenomous arthropods, initial encounter: Secondary | ICD-10-CM

## 2020-10-21 DIAGNOSIS — J4540 Moderate persistent asthma, uncomplicated: Secondary | ICD-10-CM

## 2020-10-21 MED ORDER — PREDNISONE 20 MG PO TABS
40.0000 mg | ORAL_TABLET | Freq: Every day | ORAL | 0 refills | Status: AC
  Filled 2020-10-21: qty 10, 5d supply, fill #0

## 2020-10-21 MED ORDER — DOXYCYCLINE HYCLATE 100 MG PO TABS
100.0000 mg | ORAL_TABLET | Freq: Two times a day (BID) | ORAL | 0 refills | Status: DC
  Filled 2020-10-21: qty 20, 10d supply, fill #0

## 2020-10-21 NOTE — ED Triage Notes (Addendum)
Pt reports was bitten by a bug on foot between toes about 0530 yesterday, but did not see the insect. Pt reports noticed foot started to swell. Pt cleansed area with alcohol, took benadryl, placed cold compress, elevated extremity. However pt reports swelling did not go down.  Pt now reports pain in foot, numbness going up leg (starting yesterday morning), headache and nausea.

## 2020-10-21 NOTE — ED Provider Notes (Signed)
[facility]    CSN: [id number] Arrival date & time: 10/21/20  1317      History   Chief Complaint Chief Complaint  Patient presents with   Insect Bite   Headache   Nausea   leg numbness    HPI Shawn Raymond is a 40 y.o. male presenting with insect bite between great toe and second toe right foot that occurred about 24 hours ago.  Medical history asthma, sarcoidosis, spinal stenosis.  States that he first noticed the bite because he had itching between his toes, he scratched in bed and felt something that felt like a scab so he pulled it off and through to the side.  He then realized it might of been a tick, but he could not find it.  He is presenting today with swelling of the right foot, with some numbness and tingling in the foot extending to the ankle.  Also with throbbing headache behind forehead and nausea without vomiting.  Denies dizziness, body aches, fevers, chills, general malaise, rashes.  Has been using over-the-counter remedies like Benadryl, cool compresses, elevation of extremity. Tdap UTD.  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   INTRINSIC ASTHMA, UNSPECIFIED 03/23/2009   Nonspecific (abnormal) findings on radiological and other examination of body structure 01/07/2009   CT, CHEST, ABNORMAL 01/07/2009    Past Surgical History:  Procedure Laterality Date   ANTERIOR CERVICAL DECOMP/DISCECTOMY FUSION N/A 04/02/2018   Procedure: ANTERIOR CERVICAL DECOMPRESSION/DISCECTOMY  FUSION CERVICAL SIX- CERVICAL SEVEN ;  Surgeon: Lisbeth Renshaw, MD;  Location: [facility] OR;  Service: Neurosurgery;  Laterality: N/A;   FEMUR FRACTURE SURGERY     PATELLA RECONSTRUCTION     POSTERIOR CERVICAL FUSION/FORAMINOTOMY N/A 04/02/2018   Procedure: CERVICAL THREE- CERVICAL FOUR, CERVICAL FOUR- CERVICAL FIVE, CERVICAL FIVE- CERVICAL SIX, CERVICAL SIX- CERVICAL SEVEN LAMINECTOMY WITH LATERAL MASS POSTERIOR SEGMENTAL INSTRUEMNTATION, POSTERIOR LATERAL ARTHRODESIS;  Surgeon: Lisbeth Renshaw, MD;  Location: [facility] OR;  Service: Neurosurgery;  Laterality: N/A;       Home Medications    Prior to Admission medications   Medication Sig Start Date End Date Taking? Authorizing Provider  albuterol (VENTOLIN HFA) 108 (90 Base) MCG/ACT inhaler TAKE 2 PUFF EVERY 6 HRS AS NEEDED FOR WHEEZING 09/30/20 09/30/21 Yes Grayce Sessions, NP  budesonide-formoterol (SYMBICORT) 160-4.5 MCG/ACT inhaler Inhale 2 puffs into the lungs 2 (two) times daily. 09/30/20  Yes Grayce Sessions, NP  doxycycline (VIBRAMYCIN) 100 MG capsule Take 1 capsule (100 mg total) by mouth 2 (two) times daily. 10/21/20  Yes Rhys Martini, PA-C  methocarbamol (ROBAXIN) 500 MG tablet TAKE 1 TABLET (500 MG TOTAL) BY MOUTH 3 (THREE) TIMES DAILY. 09/30/20 09/30/21 Yes Grayce Sessions, NP  omeprazole (PRILOSEC) 40 MG capsule TAKE 1 CAPSULE (40 MG TOTAL) BY MOUTH DAILY. 09/30/20 09/30/21 Yes Grayce Sessions, NP  predniSONE (DELTASONE) 20 MG tablet Take 2 tablets (40 mg total) by mouth daily for 5 days. 10/21/20 10/26/20 Yes Rhys Martini, PA-C    Family History  Family History  Problem Relation Age of Onset   Healthy Mother    Healthy Father     Social History Social History   Tobacco Use   Smoking status: Former    Packs/day: 0.70    Pack years: 0.00    Types: Cigarettes    Quit date: 12/24/2017    Years since quitting: 2.8   Smokeless tobacco: Never   Tobacco comments:    patient has been  without a cigarette since 12/27/17   Vaping Use   Vaping Use: Never used  Substance Use Topics   Alcohol use: No   Drug use: Not Currently    Types: Marijuana    Comment: 3grams a day.      Allergies   Iodine and Shellfish allergy   Review of Systems Review of Systems  Skin:  Positive for color change.  All other systems reviewed and are negative.   Physical Exam Triage Vital Signs ED Triage Vitals  Enc Vitals Group     BP 10/21/20 1421 118/85     Pulse Rate 10/21/20 1418 (!) 59     Resp 10/21/20 1418 18     Temp 10/21/20 1418 98.2 F (36.8 C)     Temp Source 10/21/20 1418 Oral     SpO2 10/21/20 1418 99 %     Weight --      Height --      Head Circumference --      Peak Flow --      Pain Score 10/21/20 1416 10     Pain Loc --      Pain Edu? --      Excl. in GC? --    No data found.  Updated Vital Signs BP 118/85   Pulse (!) 59   Temp 98.2 F (36.8 C) (Oral)   Resp 18   SpO2 99%   Visual Acuity Right Eye Distance:   Left Eye Distance:   Bilateral Distance:    Right Eye Near:   Left Eye Near:    Bilateral Near:     Physical Exam Vitals reviewed.  Constitutional:      General: He is not in acute distress.    Appearance: Normal appearance. He is not ill-appearing or diaphoretic.  HENT:     Head: Normocephalic and atraumatic.  Cardiovascular:     Rate and Rhythm: Normal rate and regular rhythm.     Heart sounds: Normal heart sounds.  Pulmonary:     Effort: Pulmonary effort is normal.     Breath sounds: Wheezing present.     Comments: Few scattered wheezes throughout Skin:    General: Skin is warm.     Comments: R foot with effusion and faint erythema to dorsal aspect, without warmth. Tenderness between R great toe and 2nd toe. 1+ pedal edema. No calf tenderness or sweling. No discharge. No bullseyerash. No insect currently present.   Neurological:     General: No focal deficit present.     Mental Status: He is alert and oriented to person, place, and time.  Psychiatric:         Mood and Affect: Mood normal.        Behavior: Behavior normal.        Thought Content: Thought content normal.        Judgment: Judgment normal.     UC Treatments / Results  Labs (all labs ordered are listed, but only abnormal results are displayed) Labs Reviewed - No data to display  EKG   Radiology No results found.  Procedures Procedures (including critical care time)  Medications Ordered in UC Medications - No data to display  Initial Impression / Assessment and Plan / UC Course  I have reviewed the triage vital signs and the nursing notes.  Pertinent labs & imaging results that were available during my care of the patient were reviewed by me and considered in my medical decision making (see chart for details).     This patient is a very pleasant 40 y.o. year old male presenting with insect bite- concern for tick bite. Afebrile, nontachy. Wheezing incidentally noted on exam, but pt denies pulmonary symptoms- continue inhaler regimen.  For insect bite and inflammation, prednisone and doxycycline as below. Tdap UTD 2019. Wound care instructions discussed.   ED return precautions discussed. Patient verbalizes understanding and agreement.    Final Clinical Impressions(s) / UC Diagnoses   Final diagnoses:  Localized swelling of right foot  Insect bite of right foot, initial encounter  Moderate persistent asthma without complication     Discharge Instructions      -Prednisone, 2 pills taken at the same time for 5 days in a row.  Try taking this earlier in the day as it can give you energy. Avoid NSAIDs like ibuprofen and alleve while taking this medication as they can increase your risk of stomach upset and even GI bleeding when in combination with a steroid. You can continue tylenol (acetaminophen) up to 1000mg  3x daily. -Doxycycline twice daily for 10 days.  Make sure to wear sunscreen while spending time outside while on this medication as it can increase your  chance of sunburn. You can take this medication with food if you have a sensitive stomach. -Continue your regimen of benedryl, elevation, cold compresses, etc -Come back and see if symptoms worsen, like leg swelling, foot pain, new fevers/chills or bodyaches      ED Prescriptions     Medication Sig Dispense Auth. Provider   doxycycline (VIBRAMYCIN) 100 MG capsule Take 1 capsule (100 mg total) by mouth 2 (two) times daily. 20 capsule [place], PA-C   predniSONE (DELTASONE) 20 MG tablet Take 2 tablets (40 mg total) by mouth daily for 5 days. 10 tablet Rhys Martini, PA-C      PDMP not reviewed this encounter.   Rhys Martini, PA-C 10/21/20 1459

## 2020-10-21 NOTE — Discharge Instructions (Addendum)
-  Prednisone, 2 pills taken at the same time for 5 days in a row.  Try taking this earlier in the day as it can give you energy. Avoid NSAIDs like ibuprofen and alleve while taking this medication as they can increase your risk of stomach upset and even GI bleeding when in combination with a steroid. You can continue tylenol (acetaminophen) up to 1000mg  3x daily. -Doxycycline twice daily for 10 days.  Make sure to wear sunscreen while spending time outside while on this medication as it can increase your chance of sunburn. You can take this medication with food if you have a sensitive stomach. -Continue your regimen of benedryl, elevation, cold compresses, etc -Come back and see if symptoms worsen, like leg swelling, foot pain, new fevers/chills or bodyaches

## 2020-10-28 ENCOUNTER — Encounter (INDEPENDENT_AMBULATORY_CARE_PROVIDER_SITE_OTHER): Payer: Self-pay

## 2020-10-28 ENCOUNTER — Encounter (INDEPENDENT_AMBULATORY_CARE_PROVIDER_SITE_OTHER): Payer: Self-pay | Admitting: Primary Care

## 2020-10-29 ENCOUNTER — Other Ambulatory Visit: Payer: Self-pay

## 2020-10-29 MED ORDER — OXYBUTYNIN CHLORIDE ER 10 MG PO TB24
ORAL_TABLET | ORAL | 11 refills | Status: DC
  Filled 2020-10-29: qty 30, 30d supply, fill #0
  Filled 2020-12-02: qty 30, 30d supply, fill #1
  Filled 2020-12-28: qty 30, 30d supply, fill #2
  Filled 2021-01-27 – 2021-02-04 (×3): qty 30, 30d supply, fill #3
  Filled 2021-03-07: qty 30, 30d supply, fill #4
  Filled 2021-04-05: qty 30, 30d supply, fill #5
  Filled 2021-05-06: qty 30, 30d supply, fill #0
  Filled 2021-06-07: qty 30, 30d supply, fill #1
  Filled 2021-07-04: qty 30, 30d supply, fill #2
  Filled 2021-08-01: qty 30, 30d supply, fill #3
  Filled 2021-08-28: qty 30, 30d supply, fill #4
  Filled 2021-09-13 – 2021-10-04 (×4): qty 30, 30d supply, fill #5

## 2020-11-01 ENCOUNTER — Other Ambulatory Visit: Payer: Self-pay

## 2020-11-02 ENCOUNTER — Other Ambulatory Visit: Payer: Self-pay

## 2020-12-02 ENCOUNTER — Other Ambulatory Visit: Payer: Self-pay

## 2020-12-02 ENCOUNTER — Other Ambulatory Visit (INDEPENDENT_AMBULATORY_CARE_PROVIDER_SITE_OTHER): Payer: Self-pay | Admitting: Primary Care

## 2020-12-02 MED ORDER — BUDESONIDE-FORMOTEROL FUMARATE 160-4.5 MCG/ACT IN AERO
2.0000 | INHALATION_SPRAY | Freq: Two times a day (BID) | RESPIRATORY_TRACT | 1 refills | Status: DC
  Filled 2020-12-02: qty 20.4, 60d supply, fill #0

## 2020-12-06 ENCOUNTER — Other Ambulatory Visit: Payer: Self-pay

## 2020-12-28 ENCOUNTER — Other Ambulatory Visit: Payer: Self-pay

## 2020-12-28 ENCOUNTER — Other Ambulatory Visit (INDEPENDENT_AMBULATORY_CARE_PROVIDER_SITE_OTHER): Payer: Self-pay | Admitting: Primary Care

## 2020-12-28 DIAGNOSIS — M5412 Radiculopathy, cervical region: Secondary | ICD-10-CM

## 2020-12-28 DIAGNOSIS — R12 Heartburn: Secondary | ICD-10-CM

## 2020-12-28 DIAGNOSIS — Z760 Encounter for issue of repeat prescription: Secondary | ICD-10-CM

## 2020-12-28 MED ORDER — METHOCARBAMOL 500 MG PO TABS
ORAL_TABLET | Freq: Three times a day (TID) | ORAL | 2 refills | Status: DC
Start: 2020-12-28 — End: 2021-05-12
  Filled 2020-12-28: qty 60, 20d supply, fill #0
  Filled 2021-02-22: qty 60, 20d supply, fill #1
  Filled 2021-04-05: qty 60, 20d supply, fill #2

## 2020-12-28 MED ORDER — OMEPRAZOLE 40 MG PO CPDR
DELAYED_RELEASE_CAPSULE | Freq: Every day | ORAL | 2 refills | Status: DC
  Filled 2020-12-28: qty 30, 30d supply, fill #0
  Filled 2021-01-27 – 2021-02-04 (×3): qty 30, 30d supply, fill #1
  Filled 2021-03-07: qty 30, 30d supply, fill #2

## 2020-12-28 NOTE — Telephone Encounter (Signed)
Sent request to PCP

## 2020-12-29 ENCOUNTER — Other Ambulatory Visit: Payer: Self-pay

## 2021-01-17 ENCOUNTER — Other Ambulatory Visit: Payer: Self-pay

## 2021-01-17 ENCOUNTER — Other Ambulatory Visit (INDEPENDENT_AMBULATORY_CARE_PROVIDER_SITE_OTHER): Payer: Self-pay | Admitting: Primary Care

## 2021-01-17 DIAGNOSIS — J45909 Unspecified asthma, uncomplicated: Secondary | ICD-10-CM

## 2021-01-17 DIAGNOSIS — Z760 Encounter for issue of repeat prescription: Secondary | ICD-10-CM

## 2021-01-17 MED ORDER — ALBUTEROL SULFATE HFA 108 (90 BASE) MCG/ACT IN AERS
INHALATION_SPRAY | RESPIRATORY_TRACT | 2 refills | Status: DC
  Filled 2021-01-17: qty 18, 25d supply, fill #0
  Filled 2021-02-01: qty 18, 25d supply, fill #1

## 2021-01-17 NOTE — Telephone Encounter (Signed)
Sent to PCP ?

## 2021-01-18 ENCOUNTER — Other Ambulatory Visit: Payer: Self-pay

## 2021-01-19 ENCOUNTER — Other Ambulatory Visit: Payer: Self-pay

## 2021-01-27 ENCOUNTER — Other Ambulatory Visit: Payer: Self-pay

## 2021-01-27 ENCOUNTER — Other Ambulatory Visit (INDEPENDENT_AMBULATORY_CARE_PROVIDER_SITE_OTHER): Payer: Self-pay | Admitting: Primary Care

## 2021-01-27 MED ORDER — BUDESONIDE-FORMOTEROL FUMARATE 160-4.5 MCG/ACT IN AERO
2.0000 | INHALATION_SPRAY | Freq: Two times a day (BID) | RESPIRATORY_TRACT | 0 refills | Status: DC
  Filled 2021-01-27 – 2021-02-04 (×2): qty 10.2, 30d supply, fill #0

## 2021-01-27 NOTE — Telephone Encounter (Signed)
Needs appt

## 2021-01-28 ENCOUNTER — Other Ambulatory Visit: Payer: Self-pay

## 2021-02-01 ENCOUNTER — Other Ambulatory Visit: Payer: Self-pay

## 2021-02-02 ENCOUNTER — Other Ambulatory Visit: Payer: Self-pay

## 2021-02-02 ENCOUNTER — Ambulatory Visit (INDEPENDENT_AMBULATORY_CARE_PROVIDER_SITE_OTHER): Payer: Self-pay | Admitting: Primary Care

## 2021-02-02 ENCOUNTER — Encounter (INDEPENDENT_AMBULATORY_CARE_PROVIDER_SITE_OTHER): Payer: Self-pay | Admitting: Primary Care

## 2021-02-02 VITALS — BP 124/82 | HR 66 | Temp 97.5°F | Ht 66.0 in | Wt 188.2 lb

## 2021-02-02 DIAGNOSIS — Z760 Encounter for issue of repeat prescription: Secondary | ICD-10-CM

## 2021-02-02 DIAGNOSIS — M4802 Spinal stenosis, cervical region: Secondary | ICD-10-CM

## 2021-02-02 DIAGNOSIS — J45909 Unspecified asthma, uncomplicated: Secondary | ICD-10-CM

## 2021-02-02 MED ORDER — ALBUTEROL SULFATE HFA 108 (90 BASE) MCG/ACT IN AERS
INHALATION_SPRAY | RESPIRATORY_TRACT | 2 refills | Status: DC
  Filled 2021-02-02: qty 18, fill #0
  Filled 2021-02-22: qty 18, 25d supply, fill #0
  Filled 2021-03-14: qty 18, 25d supply, fill #1
  Filled 2021-04-05: qty 18, 25d supply, fill #2

## 2021-02-02 NOTE — Patient Instructions (Signed)
Chronic Back Pain When back pain lasts longer than 3 months, it is called chronic back pain. Pain may get worse at certain times (flare-ups). There are things you can do at home to manage your pain. Follow these instructions at home: Pay attention to any changes in your symptoms. Take these actions to help with your pain: Managing pain and stiffness   If told, put ice on the painful area. Your doctor may tell you to use ice for 24-48 hours after the flare-up starts. To do this: Put ice in a plastic bag. Place a towel between your skin and the bag. Leave the ice on for 20 minutes, 2-3 times a day. If told, put heat on the painful area. Do this as often as told by your doctor. Use the heat source that your doctor recommends, such as a moist heat pack or a heating pad. Place a towel between your skin and the heat source. Leave the heat on for 20-30 minutes. Take off the heat if your skin turns bright red. This is especially important if you are unable to feel pain, heat, or cold. You may have a greater risk of getting burned. Soak in a warm bath. This can help relieve pain. Activity  Avoid bending and other activities that make pain worse. When standing: Keep your upper back and neck straight. Keep your shoulders pulled back. Avoid slouching. When sitting: Keep your back straight. Relax your shoulders. Do not round your shoulders or pull them backward. Do not sit or stand in one place for long periods of time. Take short rest breaks during the day. Lying down or standing is usually better than sitting. Resting can help relieve pain. When sitting or lying down for a long time, do some mild activity or stretching. This will help to prevent stiffness and pain. Get regular exercise. Ask your doctor what activities are safe for you. Do not lift anything that is heavier than 10 lb (4.5 kg) or the limit that you are told, until your doctor says that it is safe. To prevent injury when you lift  things: Bend your knees. Keep the weight close to your body. Avoid twisting. Sleep on a firm mattress. Try lying on your side with your knees slightly bent. If you lie on your back, put a pillow under your knees. Medicines Treatment may include medicines for pain and swelling taken by mouth or put on the skin, prescription pain medicine, or muscle relaxants. Take over-the-counter and prescription medicines only as told by your doctor. Ask your doctor if the medicine prescribed to you: Requires you to avoid driving or using machinery. Can cause trouble pooping (constipation). You may need to take these actions to prevent or treat trouble pooping: Drink enough fluid to keep your pee (urine) pale yellow. Take over-the-counter or prescription medicines. Eat foods that are high in fiber. These include beans, whole grains, and fresh fruits and vegetables. Limit foods that are high in fat and sugars. These include fried or sweet foods. General instructions Do not use any products that contain nicotine or tobacco, such as cigarettes, e-cigarettes, and chewing tobacco. If you need help quitting, ask your doctor. Keep all follow-up visits as told by your doctor. This is important. Contact a doctor if: Your pain does not get better with rest or medicine. Your pain gets worse, or you have new pain. You have a high fever. You lose weight very quickly. You have trouble doing your normal activities. Get help right away if: One   or both of your legs or feet feel weak. One or both of your legs or feet lose feeling (have numbness). You have trouble controlling when you poop (have a bowel movement) or pee (urinate). You have bad back pain and: You feel like you may vomit (nauseous), or you vomit. You have pain in your belly (abdomen). You have shortness of breath. You faint. Summary When back pain lasts longer than 3 months, it is called chronic back pain. Pain may get worse at certain times  (flare-ups). Use ice and heat as told by your doctor. Your doctor may tell you to use ice after flare-ups. This information is not intended to replace advice given to you by your health care provider. Make sure you discuss any questions you have with your health care provider. Document Revised: 05/14/2019 Document Reviewed: 05/14/2019 Elsevier Patient Education  2022 [employer] Inc[employer]  

## 2021-02-02 NOTE — Progress Notes (Signed)
Needs Rx for arthritis

## 2021-02-02 NOTE — Progress Notes (Signed)
[facility]     HPI Shawn Raymond. Shawn Raymond 40 y.o.male presents for back pain -1. Back Pain Patient reports that pain began 3 years.  He h/o back pain.  Pain is a 8/10.  It radiate from mid back to lower back- and right leg with numbness.  Walking worsens pain.  Laying down and yoga  improves pain.  Patient has been taking tylenol arthritis  for pain with little relief.  Patient denies trauma or injury.  He denies  dysuria, hematuria, fevers, chills, nausea, vomiting, abdominal pain, renal stones. Aggravating factors: certain movements and prolonged walking/standing. Alleviating factors: rest. Progressive LE weakness or saddle anesthesia: positive none. Extremity sensation changes or weakness: yes. Ambulatory without difficulty. Incontinent of bowel/bladder at times without urinary retention. Normal PO intake without n/v. No associated abdominal pain/n/v. Self treatment: has OTC analgesics, with minimal relief. Patient has urinary incontinence, bowel incontinence, weakness, falls,(4 times in the last 3 months ) sensation changes with  pain anywhere else- hands . He h/o back surgeries.  Past Medical History:  Diagnosis Date   Asthma    Sarcoidosis      Allergies  Allergen Reactions   Iodine Anaphylaxis   Shellfish Allergy Anaphylaxis      Current Outpatient Medications on File Prior to Visit  Medication Sig Dispense Refill   albuterol (VENTOLIN HFA) 108 (90 Base) MCG/ACT inhaler TAKE 2 PUFF EVERY 6 HOURS AS NEEDED FOR WHEEZING 18 g 2   budesonide-formoterol (SYMBICORT) 160-4.5 MCG/ACT inhaler Inhale 2 puffs into the lungs 2 (two) times daily. 10.2 g 0   methocarbamol (ROBAXIN) 500 MG tablet TAKE 1 TABLET (500 MG TOTAL) BY MOUTH 3 (THREE) TIMES DAILY. 60 tablet 2   omeprazole (PRILOSEC) 40 MG capsule TAKE 1 CAPSULE (40 MG TOTAL) BY MOUTH DAILY. 30 capsule 2   oxybutynin (DITROPAN-XL) 10 MG 24 hr tablet Take 1 tab by mouth daily as directed 30 tablet 11   No current  facility-administered medications on file prior to visit.    ROS: all negative except above.   Physical Exam: Filed Weights   02/02/21 1525  Weight: 188 lb 3.2 oz (85.4 kg)   BP 124/82 (BP Location: Left Arm, Patient Position: Sitting, Cuff Size: Normal)   Pulse 66   Temp (!) 97.5 F (36.4 C) (Temporal)   Ht 5\' 6"  (1.676 m)   Wt 188 lb 3.2 oz (85.4 kg)   SpO2 94%   BMI 30.38 kg/m  General Appearance: Well nourished, in no apparent distress. Eyes: PERRLA, EOMs, conjunctiva no swelling or erythema Sinuses: No Frontal/maxillary tenderness ENT/Mouth: Hearing normal.  Neck: Supple, thyroid normal.  Respiratory: Respiratory effort normal, BS equal bilaterally with wheezing all 4 quadrants  Cardio: RRR with no MRGs. Brisk peripheral pulses without edema.  Abdomen: Soft, + BS.  Non tender, no guarding, rebound, hernias, masses. Lymphatics: Non tender without lymphadenopathy.  Musculoskeletal: Full ROM, 5/5 strength, normal gait.  Skin: Warm, dry without rashes, lesions, ecchymosis.  Neuro: Cranial nerves intact. Normal muscle tone, no cerebellar symptoms. Sensation intact.  Psych: Awake and oriented X 3, normal affect, Insight and Judgment appropriate.   Shawn Raymond was seen today for annual exam.  Diagnoses and all orders for this visit: Shawn Raymond was seen today for annual exam. Diagnoses and all orders for this visit:  Spinal stenosis in cervical region  Cauda equina syndrome / versus chronic back pain with underlining causes can cause a variety of symptoms, including: severe low back pain. bladder dysfunction such as urinary retention  or incontinence (loss of control) bowel incontinence (loss of control) can be a medical emergency and advise to be evaluated at emergency room.  Ambulatory referral to Neurosurgery   Refill clinic medication management patient Albuterol prn   Uncomplicated asthma, unspecified asthma severity, unspecified whether persistent  Refilled albuterol on  maintenance Symbicort    Grayce Sessions, NP 3:36 PM

## 2021-02-03 ENCOUNTER — Other Ambulatory Visit: Payer: Self-pay

## 2021-02-04 ENCOUNTER — Other Ambulatory Visit: Payer: Self-pay

## 2021-02-22 ENCOUNTER — Other Ambulatory Visit (INDEPENDENT_AMBULATORY_CARE_PROVIDER_SITE_OTHER): Payer: Self-pay | Admitting: Primary Care

## 2021-02-22 ENCOUNTER — Other Ambulatory Visit: Payer: Self-pay

## 2021-02-22 NOTE — Telephone Encounter (Signed)
Sent to PCP ?

## 2021-02-23 ENCOUNTER — Other Ambulatory Visit: Payer: Self-pay

## 2021-02-23 MED ORDER — BUDESONIDE-FORMOTEROL FUMARATE 160-4.5 MCG/ACT IN AERO
2.0000 | INHALATION_SPRAY | Freq: Two times a day (BID) | RESPIRATORY_TRACT | 6 refills | Status: DC
  Filled 2021-02-23 – 2021-03-07 (×2): qty 10.2, 30d supply, fill #0
  Filled 2021-04-05: qty 10.2, 30d supply, fill #1
  Filled 2021-05-06: qty 10.2, 30d supply, fill #0
  Filled 2021-06-07: qty 10.2, 30d supply, fill #1
  Filled 2021-07-04 – 2021-07-11 (×2): qty 10.2, 30d supply, fill #2
  Filled 2021-08-01 – 2021-08-03 (×2): qty 10.2, 30d supply, fill #3
  Filled 2021-08-28: qty 10.2, 30d supply, fill #4

## 2021-02-25 ENCOUNTER — Other Ambulatory Visit: Payer: Self-pay

## 2021-03-07 ENCOUNTER — Other Ambulatory Visit: Payer: Self-pay

## 2021-03-14 ENCOUNTER — Other Ambulatory Visit: Payer: Self-pay

## 2021-04-05 ENCOUNTER — Other Ambulatory Visit (INDEPENDENT_AMBULATORY_CARE_PROVIDER_SITE_OTHER): Payer: Self-pay | Admitting: Primary Care

## 2021-04-05 ENCOUNTER — Other Ambulatory Visit: Payer: Self-pay

## 2021-04-05 DIAGNOSIS — R12 Heartburn: Secondary | ICD-10-CM

## 2021-04-05 DIAGNOSIS — Z760 Encounter for issue of repeat prescription: Secondary | ICD-10-CM

## 2021-04-05 MED ORDER — OMEPRAZOLE 40 MG PO CPDR
DELAYED_RELEASE_CAPSULE | Freq: Every day | ORAL | 2 refills | Status: DC
  Filled 2021-04-05 – 2021-05-06 (×2): qty 30, 30d supply, fill #0
  Filled 2021-06-07: qty 30, 30d supply, fill #1

## 2021-04-05 NOTE — Telephone Encounter (Signed)
Requested Prescriptions  Pending Prescriptions Disp Refills   omeprazole (PRILOSEC) 40 MG capsule 30 capsule 2    Sig: TAKE 1 CAPSULE (40 MG TOTAL) BY MOUTH DAILY.     Gastroenterology: Proton Pump Inhibitors Passed - 04/05/2021 10:56 AM      Passed - Valid encounter within last 12 months    Recent Outpatient Visits          2 months ago Spinal stenosis in cervical region   [facility] Grayce Sessions, NP   6 months ago Umbilical hernia with obstruction, without gangrene   [facility] Grayce Sessions, NP   1 year ago Acute bilateral low back pain, unspecified whether sciatica present   [facility] Grayce Sessions, NP   1 year ago Acute bilateral low back pain, unspecified whether sciatica present   [facility] Grayce Sessions, NP   1 year ago Bilateral impacted cerumen   [facility] Grayce Sessions, NP

## 2021-05-06 ENCOUNTER — Other Ambulatory Visit: Payer: Self-pay

## 2021-05-06 ENCOUNTER — Other Ambulatory Visit (INDEPENDENT_AMBULATORY_CARE_PROVIDER_SITE_OTHER): Payer: Self-pay | Admitting: Primary Care

## 2021-05-06 DIAGNOSIS — Z760 Encounter for issue of repeat prescription: Secondary | ICD-10-CM

## 2021-05-06 DIAGNOSIS — J45909 Unspecified asthma, uncomplicated: Secondary | ICD-10-CM

## 2021-05-06 DIAGNOSIS — M5412 Radiculopathy, cervical region: Secondary | ICD-10-CM

## 2021-05-06 NOTE — Telephone Encounter (Signed)
Routed to PCP 

## 2021-05-09 ENCOUNTER — Other Ambulatory Visit: Payer: Self-pay

## 2021-05-12 ENCOUNTER — Other Ambulatory Visit (INDEPENDENT_AMBULATORY_CARE_PROVIDER_SITE_OTHER): Payer: Self-pay | Admitting: Primary Care

## 2021-05-12 ENCOUNTER — Other Ambulatory Visit: Payer: Self-pay

## 2021-05-12 DIAGNOSIS — M5412 Radiculopathy, cervical region: Secondary | ICD-10-CM

## 2021-05-12 DIAGNOSIS — Z760 Encounter for issue of repeat prescription: Secondary | ICD-10-CM

## 2021-05-12 MED ORDER — ALBUTEROL SULFATE HFA 108 (90 BASE) MCG/ACT IN AERS
INHALATION_SPRAY | RESPIRATORY_TRACT | 2 refills | Status: DC
  Filled 2021-05-12: qty 18, 25d supply, fill #0
  Filled 2021-06-07: qty 18, 25d supply, fill #1
  Filled 2021-07-04 – 2021-07-11 (×2): qty 18, 25d supply, fill #2

## 2021-05-12 MED ORDER — METHOCARBAMOL 500 MG PO TABS
ORAL_TABLET | Freq: Three times a day (TID) | ORAL | 2 refills | Status: DC
  Filled 2021-05-12: qty 60, 20d supply, fill #0
  Filled 2021-06-07: qty 60, 20d supply, fill #1
  Filled 2021-07-04 – 2021-07-11 (×2): qty 60, 20d supply, fill #2

## 2021-06-07 ENCOUNTER — Other Ambulatory Visit: Payer: Self-pay

## 2021-06-08 ENCOUNTER — Other Ambulatory Visit: Payer: Self-pay

## 2021-07-04 ENCOUNTER — Other Ambulatory Visit: Payer: Self-pay

## 2021-07-04 ENCOUNTER — Other Ambulatory Visit (INDEPENDENT_AMBULATORY_CARE_PROVIDER_SITE_OTHER): Payer: Self-pay | Admitting: Primary Care

## 2021-07-04 DIAGNOSIS — R12 Heartburn: Secondary | ICD-10-CM

## 2021-07-04 DIAGNOSIS — Z760 Encounter for issue of repeat prescription: Secondary | ICD-10-CM

## 2021-07-04 MED ORDER — OMEPRAZOLE 40 MG PO CPDR
DELAYED_RELEASE_CAPSULE | Freq: Every day | ORAL | 2 refills | Status: DC
  Filled 2021-07-04 – 2021-07-11 (×2): qty 30, 30d supply, fill #0
  Filled 2021-08-01 – 2021-08-10 (×2): qty 30, 30d supply, fill #1
  Filled 2021-08-28 – 2021-09-13 (×2): qty 30, 30d supply, fill #2

## 2021-07-05 ENCOUNTER — Other Ambulatory Visit: Payer: Self-pay

## 2021-07-05 ENCOUNTER — Other Ambulatory Visit (HOSPITAL_BASED_OUTPATIENT_CLINIC_OR_DEPARTMENT_OTHER): Payer: Self-pay

## 2021-07-11 ENCOUNTER — Other Ambulatory Visit: Payer: Self-pay

## 2021-08-01 ENCOUNTER — Other Ambulatory Visit (INDEPENDENT_AMBULATORY_CARE_PROVIDER_SITE_OTHER): Payer: Self-pay | Admitting: Primary Care

## 2021-08-01 ENCOUNTER — Other Ambulatory Visit: Payer: Self-pay

## 2021-08-01 DIAGNOSIS — Z760 Encounter for issue of repeat prescription: Secondary | ICD-10-CM

## 2021-08-01 DIAGNOSIS — M5412 Radiculopathy, cervical region: Secondary | ICD-10-CM

## 2021-08-01 DIAGNOSIS — J45909 Unspecified asthma, uncomplicated: Secondary | ICD-10-CM

## 2021-08-01 MED ORDER — ALBUTEROL SULFATE HFA 108 (90 BASE) MCG/ACT IN AERS
INHALATION_SPRAY | RESPIRATORY_TRACT | 2 refills | Status: DC
  Filled 2021-08-01: qty 18, fill #0
  Filled 2021-08-03 – 2021-08-10 (×3): qty 18, 25d supply, fill #0
  Filled 2021-08-28: qty 18, 25d supply, fill #1
  Filled 2021-09-13 – 2021-09-19 (×3): qty 18, 25d supply, fill #2

## 2021-08-01 NOTE — Telephone Encounter (Signed)
Sent to PCP ?

## 2021-08-03 ENCOUNTER — Other Ambulatory Visit (INDEPENDENT_AMBULATORY_CARE_PROVIDER_SITE_OTHER): Payer: Self-pay | Admitting: Primary Care

## 2021-08-03 ENCOUNTER — Other Ambulatory Visit: Payer: Self-pay

## 2021-08-03 DIAGNOSIS — M5412 Radiculopathy, cervical region: Secondary | ICD-10-CM

## 2021-08-03 DIAGNOSIS — Z760 Encounter for issue of repeat prescription: Secondary | ICD-10-CM

## 2021-08-03 MED ORDER — METHOCARBAMOL 500 MG PO TABS
ORAL_TABLET | Freq: Three times a day (TID) | ORAL | 2 refills | Status: DC
Start: 2021-08-03 — End: 2021-10-12
  Filled 2021-08-03: qty 60, 20d supply, fill #0
  Filled 2021-09-13: qty 60, 20d supply, fill #1
  Filled 2021-09-30: qty 60, 20d supply, fill #2

## 2021-08-04 ENCOUNTER — Other Ambulatory Visit: Payer: Self-pay

## 2021-08-10 ENCOUNTER — Other Ambulatory Visit: Payer: Self-pay

## 2021-08-12 ENCOUNTER — Ambulatory Visit (HOSPITAL_COMMUNITY)
Admission: EM | Admit: 2021-08-12 | Discharge: 2021-08-12 | Disposition: A | Discharge status: Home / Self Care | Payer: Medicaid Other | Attending: Internal Medicine | Admitting: Internal Medicine

## 2021-08-12 ENCOUNTER — Other Ambulatory Visit: Payer: Self-pay

## 2021-08-12 DIAGNOSIS — R350 Frequency of micturition: Secondary | ICD-10-CM

## 2021-08-12 DIAGNOSIS — Z202 Contact with and (suspected) exposure to infections with a predominantly sexual mode of transmission: Secondary | ICD-10-CM | POA: Diagnosis not present

## 2021-08-12 LAB — POCT URINALYSIS DIPSTICK, ED / UC
Bilirubin Urine: NEGATIVE
Glucose, UA: NEGATIVE mg/dL
Hgb urine dipstick: NEGATIVE
Ketones, ur: NEGATIVE mg/dL
Leukocytes,Ua: NEGATIVE
Nitrite: NEGATIVE
Protein, ur: NEGATIVE mg/dL
Specific Gravity, Urine: <=1.005 (ref 1.005–1.030)
Urobilinogen, UA: 0.2 mg/dL (ref 0.0–1.0)
pH: 5 (ref 5.0–8.0)

## 2021-08-12 NOTE — Discharge Instructions (Signed)
Your STI testing will come back in the next 2 to 3 days.  Please abstain from having sexual intercourse until your results have come back negative to avoid spread of potential STIs.  Please utilize safe sexual practices and use condoms. ? ?If you develop any new or worsening symptoms please return to urgent care for evaluation.  Please follow-up with your primary care provider for further evaluation and management of your symptoms. ?

## 2021-08-12 NOTE — ED Provider Notes (Signed)
?[facility] ? ? ? ?CSN: [id number] ?Arrival date & time: 08/12/21  1159 ? ? ?  ? ?History   ?Chief Complaint ?Chief Complaint  ?Patient presents with  ? Exposure to STD  ? ? ?HPI ?Shawn Raymond is a 41 y.o. male.  ? ?Patient presents urgent care for evaluation after 2 to 3-day history of clear discharge from his penis and right lower quadrant abdominal pain.  The pain is not currently bothering him at this time and he is concerned that he got an STI from his wife.  He states that his wife does not have any symptoms and has been faithful to his knowledge.  They are in a monogamous relationship.  He states he has not had intercourse with anyone other than his wife.  He does not use condoms.  Denies fever, nausea, vomiting, constipation, abdominal surgical history, and recent antibiotic use.  Denies any other aggravating or relieving factors to abdominal pain.  Denies dysuria, urgency, but reports urinary frequency.  Attributes this to his oxybutynin use for his back pain. ? ? ?Exposure to STD ? ? ?Past Medical History:  ?Diagnosis Date  ? Asthma   ? Sarcoidosis   ? ? ?Patient Active Problem List  ? Diagnosis Date Noted  ? Stenosis of cervical spine with myelopathy ([place]) 04/02/2018  ? Cervical disc disorder with radiculopathy of cervical region 01/25/2018  ? Spinal stenosis in cervical region 01/25/2018  ? Cervicalgia 01/25/2018  ? Heartburn 01/25/2018  ? FRACTURE, ANKLE, RIGHT 08/09/2009  ? ANKLE SPRAIN, RIGHT 08/09/2009  ? COCAINE ABUSE 08/03/2009  ? TOBACCO ABUSE 08/02/2009  ? 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 INTRINSIC ASTHMA, UNSPECIFIED 03/23/2009  ? Nonspecific (abnormal) findings on radiological and other examination of body structure 01/07/2009  ? CT, CHEST, ABNORMAL 01/07/2009  ? ? ?Past Surgical History:  ?Procedure Laterality Date  ? ANTERIOR CERVICAL DECOMP/DISCECTOMY FUSION N/A 04/02/2018  ? Procedure: ANTERIOR CERVICAL DECOMPRESSION/DISCECTOMY FUSION CERVICAL SIX- CERVICAL SEVEN ;   Surgeon: Consuella Lose, MD;  Location: [place];  Service: Neurosurgery;  Laterality: N/A;  ? FEMUR FRACTURE SURGERY    ? PATELLA RECONSTRUCTION    ? POSTERIOR CERVICAL FUSION/FORAMINOTOMY N/A 04/02/2018  ? Procedure: CERVICAL THREE- CERVICAL FOUR, CERVICAL FOUR- CERVICAL FIVE, CERVICAL FIVE- CERVICAL SIX, CERVICAL SIX- CERVICAL SEVEN LAMINECTOMY WITH LATERAL MASS POSTERIOR SEGMENTAL INSTRUEMNTATION, POSTERIOR LATERAL ARTHRODESIS;  Surgeon: Consuella Lose, MD;  Location: [place];  Service: Neurosurgery;  Laterality: N/A;  ? ? ? ? ? ?Home Medications   ? ?Prior to Admission medications   ?Medication Sig Start Date End Date Taking? Authorizing Provider  ?albuterol (VENTOLIN HFA) 108 (90 Base) MCG/ACT inhaler TAKE 2 PUFF EVERY 6 HOURS AS NEEDED FOR WHEEZING 08/01/21 08/01/22  Kerin Perna, NP  ?budesonide-formoterol (SYMBICORT) 160-4.5 MCG/ACT inhaler Inhale 2 puffs into the lungs 2 (two) times daily. 02/23/21   Kerin Perna, NP  ?methocarbamol (ROBAXIN) 500 MG tablet TAKE 1 TABLET (500 MG TOTAL) BY MOUTH 3 (THREE) TIMES DAILY. 08/03/21 08/03/22  Kerin Perna, NP  ?omeprazole (PRILOSEC) 40 MG capsule TAKE 1 CAPSULE (40 MG TOTAL) BY MOUTH DAILY. 07/04/21 07/04/22  Kerin Perna, NP  ?oxybutynin (DITROPAN-XL) 10 MG 24 hr tablet Take 1 tab by mouth daily as directed 10/28/20   Bishop Limbo, MD  ? ? ?Family History ?Family History  ?Problem Relation Age of Onset  ? Healthy Mother   ? Healthy Father   ? ? ?Social History ?Social History  ? ?Tobacco Use  ? Smoking status: Former  ?  Packs/day: 0.70  ?  Types: Cigarettes  ?  Quit date: 12/24/2017  ?  Years since quitting: 3.6  ? Smokeless tobacco: Never  ? Tobacco comments:  ?  patient has been  without a cigarette since 12/27/17  ?Vaping Use  ? Vaping Use: Never used  ?Substance Use Topics  ? Alcohol use: No  ? Drug use: Not Currently  ?  Types: Marijuana  ?  Comment: 3grams a day.   ? ? ? ?Allergies   ?Iodine and Shellfish allergy ? ? ?Review of  Systems ?Review of Systems ?Per HPI ? ?Physical Exam ?Triage Vital Signs ?ED Triage Vitals  ?Enc Vitals Group  ?   BP 08/12/21 1333 (!) 144/85  ?   Pulse Rate 08/12/21 1332 99  ?   Resp 08/12/21 1332 18  ?   Temp 08/12/21 1333 97.8 ?F (36.6 ?C)  ?   Temp Source 08/12/21 1333 Oral  ?   SpO2 08/12/21 1333 100 %  ?   Weight --   ?   Height --   ?   Head Circumference --   ?   Peak Flow --   ?   Pain Score --   ?   Pain Loc --   ?   Pain Edu? --   ?   Excl. in [place]? --   ? ?No data found. ? ?Updated Vital Signs ?BP (!) 144/85 (BP Location: Left Arm)   Pulse (!) 57   Temp 97.8 ?F (36.6 ?C) (Oral)   Resp 18   SpO2 100%  ? ?Visual Acuity ?Right Eye Distance:   ?Left Eye Distance:   ?Bilateral Distance:   ? ?Right Eye Near:   ?Left Eye Near:    ?Bilateral Near:    ? ?Physical Exam ?Vitals and nursing note reviewed.  ?Constitutional:   ?   General: He is not in acute distress. ?   Appearance: Normal appearance. He is well-developed. He is not ill-appearing.  ?   Comments: Very pleasant patient sitting comfortably on exam able in no acute distress.   ?HENT:  ?   Head: Normocephalic and atraumatic.  ?   Right Ear: Tympanic membrane, ear canal and external ear normal.  ?   Left Ear: Tympanic membrane, ear canal and external ear normal.  ?   Nose: Nose normal.  ?   Mouth/Throat:  ?   Mouth: Mucous membranes are moist.  ?   Pharynx: No posterior oropharyngeal erythema.  ?Eyes:  ?   General: Lids are normal. Vision grossly intact. Gaze aligned appropriately.  ?   Extraocular Movements: Extraocular movements intact.  ?   Conjunctiva/sclera: Conjunctivae normal.  ?   Right eye: Right conjunctiva is not injected.  ?   Left eye: Left conjunctiva is not injected.  ?Cardiovascular:  ?   Rate and Rhythm: Normal rate and regular rhythm.  ?   Heart sounds: Normal heart sounds, S1 normal and S2 normal. No murmur heard. ?Pulmonary:  ?   Effort: Pulmonary effort is normal. No respiratory distress.  ?   Breath sounds: Normal breath sounds.  No decreased air movement.  ?Abdominal:  ?   General: There is no distension.  ?   Palpations: Abdomen is soft.  ?   Tenderness: There is no abdominal tenderness. There is no right CVA tenderness or left CVA tenderness.  ?   Comments: Patient is nontender to all aspects of abdomen.  ?Musculoskeletal:     ?   General: No swelling.  ?  Cervical back: Neck supple.  ?   Right lower leg: No edema.  ?   Left lower leg: No edema.  ?Lymphadenopathy:  ?   Cervical: No cervical adenopathy.  ?Skin: ?   General: Skin is warm and dry.  ?   Capillary Refill: Capillary refill takes less than 2 seconds.  ?   Findings: No rash.  ?Neurological:  ?   General: No focal deficit present.  ?   Mental Status: He is alert and oriented to person, place, and time. Mental status is at baseline.  ?   Gait: Gait is intact.  ?Psychiatric:     ?   Attention and Perception: Attention and perception normal.     ?   Mood and Affect: Mood normal.     ?   Speech: Speech normal.     ?   Behavior: Behavior normal. Behavior is cooperative.     ?   Thought Content: Thought content normal.     ?   Cognition and Memory: Cognition and memory normal.     ?   Judgment: Judgment normal.  ? ? ? ?UC Treatments / Results  ?Labs ?(all labs ordered are listed, but only abnormal results are displayed) ?Labs Reviewed  ?POCT URINALYSIS DIPSTICK, ED / UC  ? ? ?EKG ? ? ?Radiology ?No results found. ? ?Procedures ?Procedures (including critical care time) ? ?Medications Ordered in UC ?Medications - No data to display ? ?Initial Impression / Assessment and Plan / UC Course  ?I have reviewed the triage vital signs and the nursing notes. ? ?Pertinent labs & imaging results that were available during my care of the patient were reviewed by me and considered in my medical decision making (see chart for details). ? ?STI testing performed today.  Deferred treatment until results are available.  Urinalysis shows no sign of urinary tract infection or other urinary abnormality  today.  Patient educated on use of condoms to prevent the spread of STIs.  Instructed he will receive a phone call from our callback nurse if any of his results are positive or if the treatment plan changes.  ED retu

## 2021-08-12 NOTE — ED Triage Notes (Signed)
C/o right side pain and penile discharge. He thinks his wife gave him a STD. ?

## 2021-08-16 LAB — CYTOLOGY, (ORAL, ANAL, URETHRAL) ANCILLARY ONLY
Chlamydia: NEGATIVE
Comment: NEGATIVE
Comment: NEGATIVE
Comment: NORMAL
Neisseria Gonorrhea: NEGATIVE
Trichomonas: NEGATIVE

## 2021-08-29 ENCOUNTER — Other Ambulatory Visit: Payer: Self-pay

## 2021-08-30 ENCOUNTER — Other Ambulatory Visit: Payer: Self-pay

## 2021-09-13 ENCOUNTER — Other Ambulatory Visit (INDEPENDENT_AMBULATORY_CARE_PROVIDER_SITE_OTHER): Payer: Self-pay | Admitting: Primary Care

## 2021-09-13 ENCOUNTER — Other Ambulatory Visit: Payer: Self-pay

## 2021-09-13 MED ORDER — BUDESONIDE-FORMOTEROL FUMARATE 160-4.5 MCG/ACT IN AERO
2.0000 | INHALATION_SPRAY | Freq: Two times a day (BID) | RESPIRATORY_TRACT | 6 refills | Status: DC
  Filled 2021-09-13 – 2021-10-04 (×4): qty 10.2, 30d supply, fill #0
  Filled 2021-10-12 – 2021-11-01 (×3): qty 10.2, 30d supply, fill #1
  Filled 2021-11-23 – 2021-12-01 (×2): qty 10.2, 30d supply, fill #2
  Filled 2021-12-12 – 2021-12-26 (×3): qty 10.2, 30d supply, fill #3
  Filled 2022-01-08 – 2022-01-20 (×5): qty 10.2, 30d supply, fill #4
  Filled 2022-02-11: qty 10.2, 30d supply, fill #5
  Filled 2022-02-20 – 2022-03-20 (×3): qty 10.2, 30d supply, fill #6

## 2021-09-19 ENCOUNTER — Other Ambulatory Visit: Payer: Self-pay

## 2021-09-20 ENCOUNTER — Other Ambulatory Visit: Payer: Self-pay

## 2021-09-21 ENCOUNTER — Other Ambulatory Visit: Payer: Self-pay

## 2021-09-30 ENCOUNTER — Other Ambulatory Visit: Payer: Self-pay

## 2021-10-04 ENCOUNTER — Other Ambulatory Visit: Payer: Self-pay

## 2021-10-07 ENCOUNTER — Other Ambulatory Visit: Payer: Self-pay

## 2021-10-12 ENCOUNTER — Other Ambulatory Visit (INDEPENDENT_AMBULATORY_CARE_PROVIDER_SITE_OTHER): Payer: Self-pay | Admitting: Primary Care

## 2021-10-12 ENCOUNTER — Other Ambulatory Visit: Payer: Self-pay

## 2021-10-12 DIAGNOSIS — J45909 Unspecified asthma, uncomplicated: Secondary | ICD-10-CM

## 2021-10-12 DIAGNOSIS — R12 Heartburn: Secondary | ICD-10-CM

## 2021-10-12 DIAGNOSIS — M5412 Radiculopathy, cervical region: Secondary | ICD-10-CM

## 2021-10-12 DIAGNOSIS — Z760 Encounter for issue of repeat prescription: Secondary | ICD-10-CM

## 2021-10-13 ENCOUNTER — Other Ambulatory Visit: Payer: Self-pay

## 2021-10-13 MED ORDER — OMEPRAZOLE 40 MG PO CPDR
DELAYED_RELEASE_CAPSULE | Freq: Every day | ORAL | 2 refills | Status: DC
  Filled 2021-10-13: qty 30, 30d supply, fill #0
  Filled 2021-11-01 – 2021-11-18 (×2): qty 30, 30d supply, fill #1
  Filled 2021-12-12 – 2021-12-20 (×2): qty 30, 30d supply, fill #2

## 2021-10-13 NOTE — Telephone Encounter (Signed)
Routed to PCP 

## 2021-10-13 NOTE — Telephone Encounter (Signed)
Requested medication (s) are due for refill today -provider review   Requested medication (s) are on the active medication list - yes  Future visit scheduled =no  Last refill: 08/03/21 #60 2RF  Notes to clinic: non delegated Rx, fails visit protocol  Requested Prescriptions  Pending Prescriptions Disp Refills   methocarbamol (ROBAXIN) 500 MG tablet 60 tablet 2    Sig: TAKE 1 TABLET (500 MG TOTAL) BY MOUTH 3 (THREE) TIMES DAILY.     Not Delegated - Analgesics:  Muscle Relaxants Failed - 10/12/2021  7:06 PM      Failed - This refill cannot be delegated      Failed - Valid encounter within last 6 months    Recent Outpatient Visits           8 months ago Spinal stenosis in cervical region   [facility] Grayce Sessions, NP   1 year ago Umbilical hernia with obstruction, without gangrene   [facility] Grayce Sessions, NP   1 year ago Acute bilateral low back pain, unspecified whether sciatica present   [facility] Grayce Sessions, NP   1 year ago Acute bilateral low back pain, unspecified whether sciatica present   [facility] Grayce Sessions, NP   1 year ago Bilateral impacted cerumen   [facility] Grayce Sessions, NP              Signed Prescriptions Disp Refills   omeprazole (PRILOSEC) 40 MG capsule 30 capsule 2    Sig: TAKE 1 CAPSULE (40 MG TOTAL) BY MOUTH DAILY.     Gastroenterology: Proton Pump Inhibitors Passed - 10/12/2021  7:06 PM      Passed - Valid encounter within last 12 months    Recent Outpatient Visits           8 months ago Spinal stenosis in cervical region   [facility] Grayce Sessions, NP   1 year ago Umbilical hernia with obstruction, without gangrene   [facility] Grayce Sessions, NP   1 year ago Acute bilateral low back pain, unspecified whether sciatica present    [facility] Grayce Sessions, NP   1 year ago Acute bilateral low back pain, unspecified whether sciatica present   [facility] Grayce Sessions, NP   1 year ago Bilateral impacted cerumen   [facility] Grayce Sessions, NP              Refused Prescriptions Disp Refills   albuterol (VENTOLIN HFA) 108 (90 Base) MCG/ACT inhaler 18 g 2    Sig: TAKE 2 PUFF EVERY 6 HOURS AS NEEDED FOR WHEEZING     Pulmonology:  Beta Agonists 2 Failed - 10/12/2021  7:06 PM      Failed - Last BP in normal range    BP Readings from Last 1 Encounters:  08/12/21 (!) 144/85         Passed - Last Heart Rate in normal range    Pulse Readings from Last 1 Encounters:  08/12/21 (!) 57         Passed - Valid encounter within last 12 months    Recent Outpatient Visits           8 months ago Spinal stenosis in cervical region   [facility]  Grayce Sessions, NP   1 year ago Umbilical hernia with obstruction, without gangrene   [facility] Grayce Sessions, NP   1 year ago Acute bilateral low back pain, unspecified whether sciatica present   [facility] Grayce Sessions, NP   1 year ago Acute bilateral low back pain, unspecified whether sciatica present   [facility] Grayce Sessions, NP   1 year ago Bilateral impacted cerumen   [facility] Grayce Sessions, NP                 Requested Prescriptions  Pending Prescriptions Disp Refills   methocarbamol (ROBAXIN) 500 MG tablet 60 tablet 2    Sig: TAKE 1 TABLET (500 MG TOTAL) BY MOUTH 3 (THREE) TIMES DAILY.     Not Delegated - Analgesics:  Muscle Relaxants Failed - 10/12/2021  7:06 PM      Failed - This refill cannot be delegated      Failed - Valid encounter within last 6 months    Recent Outpatient Visits           8 months ago Spinal  stenosis in cervical region   [facility] Grayce Sessions, NP   1 year ago Umbilical hernia with obstruction, without gangrene   [facility] Grayce Sessions, NP   1 year ago Acute bilateral low back pain, unspecified whether sciatica present   [facility] Grayce Sessions, NP   1 year ago Acute bilateral low back pain, unspecified whether sciatica present   [facility] Grayce Sessions, NP   1 year ago Bilateral impacted cerumen   [facility] Grayce Sessions, NP              Signed Prescriptions Disp Refills   omeprazole (PRILOSEC) 40 MG capsule 30 capsule 2    Sig: TAKE 1 CAPSULE (40 MG TOTAL) BY MOUTH DAILY.     Gastroenterology: Proton Pump Inhibitors Passed - 10/12/2021  7:06 PM      Passed - Valid encounter within last 12 months    Recent Outpatient Visits           8 months ago Spinal stenosis in cervical region   [facility] Grayce Sessions, NP   1 year ago Umbilical hernia with obstruction, without gangrene   [facility] Grayce Sessions, NP   1 year ago Acute bilateral low back pain, unspecified whether sciatica present   [facility] Grayce Sessions, NP   1 year ago Acute bilateral low back pain, unspecified whether sciatica present   [facility] Grayce Sessions, NP   1 year ago Bilateral impacted cerumen   [facility] Grayce Sessions, NP              Refused Prescriptions Disp Refills   albuterol (VENTOLIN HFA) 108 (90 Base) MCG/ACT inhaler 18 g 2    Sig: TAKE 2 PUFF EVERY 6 HOURS AS NEEDED FOR WHEEZING     Pulmonology:  Beta Agonists 2 Failed - 10/12/2021  7:06 PM      Failed - Last BP in normal range    BP Readings from Last 1 Encounters:  08/12/21 (!) 144/85         Passed -  Last Heart  Rate in normal range    Pulse Readings from Last 1 Encounters:  08/12/21 (!) 57         Passed - Valid encounter within last 12 months    Recent Outpatient Visits           8 months ago Spinal stenosis in cervical region   [facility] Grayce Sessions, NP   1 year ago Umbilical hernia with obstruction, without gangrene   [facility] Grayce Sessions, NP   1 year ago Acute bilateral low back pain, unspecified whether sciatica present   [facility] Grayce Sessions, NP   1 year ago Acute bilateral low back pain, unspecified whether sciatica present   [facility] Grayce Sessions, NP   1 year ago Bilateral impacted cerumen   [facility] Grayce Sessions, NP

## 2021-10-13 NOTE — Telephone Encounter (Signed)
Request has been routed to PCP.

## 2021-10-13 NOTE — Telephone Encounter (Signed)
Albuterol- 08/01/21 18g 2RF- too soon Requested Prescriptions  Pending Prescriptions Disp Refills  . omeprazole (PRILOSEC) 40 MG capsule 30 capsule 2    Sig: TAKE 1 CAPSULE (40 MG TOTAL) BY MOUTH DAILY.     Gastroenterology: Proton Pump Inhibitors Passed - 10/12/2021  7:06 PM      Passed - Valid encounter within last 12 months    Recent Outpatient Visits          8 months ago Spinal stenosis in cervical region   [facility] Grayce Sessions, NP   1 year ago Umbilical hernia with obstruction, without gangrene   [facility] Grayce Sessions, NP   1 year ago Acute bilateral low back pain, unspecified whether sciatica present   [facility] Grayce Sessions, NP   1 year ago Acute bilateral low back pain, unspecified whether sciatica present   [facility] Grayce Sessions, NP   1 year ago Bilateral impacted cerumen   [facility] Edwards, Michelle P, NP             . methocarbamol (ROBAXIN) 500 MG tablet 60 tablet 2    Sig: TAKE 1 TABLET (500 MG TOTAL) BY MOUTH 3 (THREE) TIMES DAILY.     Not Delegated - Analgesics:  Muscle Relaxants Failed - 10/12/2021  7:06 PM      Failed - This refill cannot be delegated      Failed - Valid encounter within last 6 months    Recent Outpatient Visits          8 months ago Spinal stenosis in cervical region   [facility] Grayce Sessions, NP   1 year ago Umbilical hernia with obstruction, without gangrene   [facility] Grayce Sessions, NP   1 year ago Acute bilateral low back pain, unspecified whether sciatica present   [facility] Grayce Sessions, NP   1 year ago Acute bilateral low back pain, unspecified whether sciatica present   [facility] Grayce Sessions, NP   1 year ago Bilateral impacted cerumen   [facility] Grayce Sessions, NP             Refused Prescriptions Disp Refills  . albuterol (VENTOLIN HFA) 108 (90 Base) MCG/ACT inhaler 18 g 2    Sig: TAKE 2 PUFF EVERY 6 HOURS AS NEEDED FOR WHEEZING     Pulmonology:  Beta Agonists 2 Failed - 10/12/2021  7:06 PM      Failed - Last BP in normal range    BP Readings from Last 1 Encounters:  08/12/21 (!) 144/85         Passed - Last Heart Rate in normal range    Pulse Readings from Last 1 Encounters:  08/12/21 (!) 57         Passed - Valid encounter within last 12 months    Recent Outpatient Visits          8 months ago Spinal stenosis in cervical region   [facility] Grayce Sessions, NP   1 year ago Umbilical hernia with obstruction, without gangrene   [facility] Grayce Sessions, NP   1 year ago Acute bilateral low back pain, unspecified whether sciatica present   [facility] Gwinda Passe  P, NP   1 year ago Acute bilateral low back pain, unspecified whether sciatica present   [facility] Grayce Sessions, NP   1 year ago Bilateral impacted cerumen   [facility] Grayce Sessions, NP

## 2021-10-17 ENCOUNTER — Other Ambulatory Visit (INDEPENDENT_AMBULATORY_CARE_PROVIDER_SITE_OTHER): Payer: Self-pay | Admitting: Primary Care

## 2021-10-17 ENCOUNTER — Other Ambulatory Visit: Payer: Self-pay

## 2021-10-17 DIAGNOSIS — Z760 Encounter for issue of repeat prescription: Secondary | ICD-10-CM

## 2021-10-17 DIAGNOSIS — J45909 Unspecified asthma, uncomplicated: Secondary | ICD-10-CM

## 2021-10-17 MED ORDER — ALBUTEROL SULFATE HFA 108 (90 BASE) MCG/ACT IN AERS
INHALATION_SPRAY | RESPIRATORY_TRACT | 2 refills | Status: DC
  Filled 2021-10-17: qty 18, 25d supply, fill #0
  Filled 2021-11-01 – 2021-11-07 (×3): qty 18, 25d supply, fill #1
  Filled 2021-11-23 – 2021-12-01 (×2): qty 18, 25d supply, fill #2

## 2021-10-19 ENCOUNTER — Other Ambulatory Visit: Payer: Self-pay

## 2021-10-21 NOTE — Telephone Encounter (Signed)
Routed to PCP 

## 2021-10-23 MED ORDER — METHOCARBAMOL 500 MG PO TABS
ORAL_TABLET | Freq: Three times a day (TID) | ORAL | 2 refills | Status: DC
  Filled 2021-10-23: qty 60, 20d supply, fill #0
  Filled 2021-11-18: qty 60, 20d supply, fill #1
  Filled 2021-12-01 – 2022-01-08 (×2): qty 60, 20d supply, fill #2

## 2021-10-24 ENCOUNTER — Other Ambulatory Visit: Payer: Self-pay

## 2021-10-26 ENCOUNTER — Other Ambulatory Visit: Payer: Self-pay

## 2021-10-26 ENCOUNTER — Encounter (HOSPITAL_COMMUNITY): Payer: Self-pay

## 2021-10-26 ENCOUNTER — Ambulatory Visit (HOSPITAL_COMMUNITY)
Admission: EM | Admit: 2021-10-26 | Discharge: 2021-10-26 | Disposition: A | Discharge status: Home / Self Care | Payer: Medicaid Other | Attending: Physician Assistant | Admitting: Physician Assistant

## 2021-10-26 DIAGNOSIS — S60221A Contusion of right hand, initial encounter: Secondary | ICD-10-CM | POA: Diagnosis not present

## 2021-10-26 DIAGNOSIS — M79641 Pain in right hand: Secondary | ICD-10-CM

## 2021-10-26 DIAGNOSIS — M79644 Pain in right finger(s): Secondary | ICD-10-CM

## 2021-10-26 MED ORDER — NAPROXEN SODIUM 550 MG PO TABS: 550.0000 mg | ORAL_TABLET | Freq: Two times a day (BID) | ORAL | 0 refills | Status: DC

## 2021-10-26 NOTE — Discharge Instructions (Signed)
Advised to take the Anaprox DS 1 twice daily to help reduce the pain. I have referred you to [facility] for evaluation of the right hand fifth digit pain with decreased function.  They should be calling you within the next 24 to 48 hours to set up an appointment.

## 2021-10-26 NOTE — ED Triage Notes (Signed)
Patient having pain in the right hand. States 20 years ago had cut the pinky off and had it re-attached. For the last week it had been painful, swollen, and reduced range of motion.   No known falls or injuries. Patient has tried OTC medications and nothing touched the pain in the right hand.

## 2021-10-26 NOTE — ED Provider Notes (Signed)
[facility]    CSN: [id number] Arrival date & time: 10/26/21  1258      History   Chief Complaint Chief Complaint  Patient presents with   Hand Pain    HPI Shawn Raymond is a 41 y.o. male.   41 year old male presents with right hand fifth digit pain.  Relates that several years ago he had had his right fifth digit finger injury which required repair and attachment.  Patient indicates that his finger and hand did well until over the past week.  Patient indicates that he has lost some of his function and the ability due to make a fist and close his hand with his fifth digit still being extended.  Patient relates he is having the pain and discomfort along the fifth distal MCP area.  Patient indicates he does not recall hitting or striking his hand against any object to cause the pain.  Patient indicates he is in significant discomfort and that taking 4 Advil at a time does not relieve his pain.  Patient does indicate that he had some cervical abnormalities that affected some of his feeling down his right arm so he does get some intermittent numbness out of his right hand.  Patient is not able to stay for the hand x-ray today due to prior commitments.  Patient request to be referred to Ortho for evaluation.   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   INTRINSIC ASTHMA, UNSPECIFIED 03/23/2009   Nonspecific (abnormal) findings on radiological and other examination of body structure  01/07/2009   CT, CHEST, ABNORMAL 01/07/2009    Past Surgical History:  Procedure Laterality Date   ANTERIOR CERVICAL DECOMP/DISCECTOMY FUSION N/A 04/02/2018   Procedure: ANTERIOR CERVICAL DECOMPRESSION/DISCECTOMY FUSION CERVICAL SIX- CERVICAL SEVEN ;  Surgeon: Lisbeth Renshaw, MD;  [facility];  Service: Neurosurgery;  Laterality: N/A;   FEMUR FRACTURE SURGERY     PATELLA RECONSTRUCTION     POSTERIOR CERVICAL FUSION/FORAMINOTOMY N/A 04/02/2018   Procedure: CERVICAL THREE- CERVICAL FOUR, CERVICAL FOUR- CERVICAL FIVE, CERVICAL FIVE- CERVICAL SIX, CERVICAL SIX- CERVICAL SEVEN LAMINECTOMY WITH LATERAL MASS POSTERIOR SEGMENTAL INSTRUEMNTATION, POSTERIOR LATERAL ARTHRODESIS;  Surgeon: Lisbeth Renshaw, MD;  Location: [facility];  Service: Neurosurgery;  Laterality: N/A;       Home Medications    Prior to Admission medications   Medication Sig Start Date End Date Taking? Authorizing Provider  methocarbamol (ROBAXIN) 500 MG tablet TAKE 1 TABLET (500 MG TOTAL) BY MOUTH 3 (THREE) TIMES DAILY. 10/23/21 10/23/22 Yes Grayce Sessions, NP  naproxen sodium (ANAPROX DS) 550 MG tablet Take 1 tablet (550 mg total) by mouth 2 (two) times daily with a meal. 10/26/21  Yes Ellsworth Lennox, PA-C  omeprazole (PRILOSEC) 40 MG capsule TAKE 1 CAPSULE (40 MG TOTAL) BY MOUTH DAILY. 10/13/21 10/13/22 Yes Grayce Sessions, NP  oxybutynin (DITROPAN-XL) 10 MG 24 hr tablet Take 1 tab by mouth daily as directed 10/28/20  Yes Abu-Salha, Yousef, MD  albuterol (VENTOLIN HFA) 108 (90  Base) MCG/ACT inhaler TAKE 2 PUFF EVERY 6 HOURS AS NEEDED FOR WHEEZING 10/17/21 10/17/22  Claiborne Rigg, NP  budesonide-formoterol [facility]) 160-4.5 MCG/ACT inhaler Inhale 2 puffs into the lungs 2 (two) times daily. 09/13/21   Grayce Sessions, NP    Family History Family History  Problem Relation Age of Onset   Healthy Mother    Healthy Father     Social History Social History   Tobacco Use   Smoking status: Former    Packs/day:  0.70    Types: Cigarettes    Quit date: 12/24/2017    Years since quitting: 3.8   Smokeless tobacco: Never   Tobacco comments:    patient has been  without a cigarette since 12/27/17  Vaping Use   Vaping Use: Never used  Substance Use Topics   Alcohol use: No   Drug use: Not Currently    Types: Marijuana    Comment: 3grams a day.      Allergies   Iodine and Shellfish allergy   Review of Systems Review of Systems  Musculoskeletal:  Positive for joint swelling (right hand pain).     Physical Exam Triage Vital Signs ED Triage Vitals  Enc Vitals Group     BP 10/26/21 1345 (!) 144/94     Pulse Rate 10/26/21 1345 (!) 55     Resp 10/26/21 1345 16     Temp 10/26/21 1345 98.1 F (36.7 C)     Temp Source 10/26/21 1345 Oral     SpO2 10/26/21 1345 99 %     Weight 10/26/21 1347 200 lb (90.7 kg)     Height 10/26/21 1347 5\' 6"  (1.676 m)     Head Circumference --      Peak Flow --      Pain Score 10/26/21 1346 8     Pain Loc --      Pain Edu? --      Excl. in GC? --    No data found.  Updated Vital Signs BP (!) 144/94 (BP Location: Right Arm)   Pulse (!) 55   Temp 98.1 F (36.7 C) (Oral)   Resp 16   Ht 5\' 6"  (1.676 m)   Wt 200 lb (90.7 kg)   SpO2 99%   BMI 32.28 kg/m   Visual Acuity Right Eye Distance:   Left Eye Distance:   Bilateral Distance:    Right Eye Near:   Left Eye Near:    Bilateral Near:     Physical Exam Constitutional:      Appearance: Normal appearance.  Musculoskeletal:     Comments: Right hand: The fifth digit has tenderness and pain palpated along the fifth distal MCP area.  There is no redness or swelling.  The fifth digit has limited flexion and extension.  When the patient closes his hand the fifth digit cannot fully flex.  Neurological:     Mental Status: He is alert.      UC Treatments / Results  Labs (all labs ordered are listed, but only abnormal results are displayed) Labs Reviewed - No data to display  EKG   Radiology No  results found.  Procedures Procedures (including critical care time)  Medications Ordered in UC Medications - No data to display  Initial Impression / Assessment and Plan / UC Course  I have reviewed the triage vital signs and the nursing notes.  Pertinent labs & imaging results that were available during my care of the patient were reviewed by me  and considered in my medical decision making (see chart for details).    Plan: 1.  Patient is internally referred to [facility] for evaluation of the hand disorder. 2.  Patient advised take Anaprox DS 1 twice daily to help relieve the hand pain since high-dose Advil does not work. Patient advised to follow-up PCP or return to urgent care if symptoms fail to improve. Final Clinical Impressions(s) / UC Diagnoses   Final diagnoses:  Contusion of right hand, initial encounter  Right hand pain  Finger pain, right     Discharge Instructions      Advised to take the Anaprox DS 1 twice daily to help reduce the pain. I have referred you to [facility] for evaluation of the right hand fifth digit pain with decreased function.  They should be calling you within the next 24 to 48 hours to set up an appointment.     ED Prescriptions     Medication Sig Dispense Auth. Provider   naproxen sodium (ANAPROX DS) 550 MG tablet Take 1 tablet (550 mg total) by mouth 2 (two) times daily with a meal. 14 tablet Ellsworth Lennox, PA-C      PDMP not reviewed this encounter.   Ellsworth Lennox, PA-C 10/26/21 1432

## 2021-11-01 ENCOUNTER — Other Ambulatory Visit: Payer: Self-pay

## 2021-11-02 ENCOUNTER — Other Ambulatory Visit: Payer: Self-pay

## 2021-11-04 ENCOUNTER — Other Ambulatory Visit: Payer: Self-pay

## 2021-11-07 ENCOUNTER — Other Ambulatory Visit: Payer: Self-pay

## 2021-11-10 ENCOUNTER — Other Ambulatory Visit: Payer: Self-pay

## 2021-11-18 ENCOUNTER — Other Ambulatory Visit: Payer: Self-pay

## 2021-11-24 ENCOUNTER — Other Ambulatory Visit: Payer: Self-pay

## 2021-11-28 ENCOUNTER — Other Ambulatory Visit: Payer: Self-pay

## 2021-12-01 ENCOUNTER — Other Ambulatory Visit: Payer: Self-pay

## 2021-12-12 ENCOUNTER — Other Ambulatory Visit (INDEPENDENT_AMBULATORY_CARE_PROVIDER_SITE_OTHER): Payer: Self-pay | Admitting: Nurse Practitioner

## 2021-12-12 ENCOUNTER — Other Ambulatory Visit: Payer: Self-pay

## 2021-12-12 DIAGNOSIS — Z760 Encounter for issue of repeat prescription: Secondary | ICD-10-CM

## 2021-12-12 DIAGNOSIS — J45909 Unspecified asthma, uncomplicated: Secondary | ICD-10-CM

## 2021-12-13 ENCOUNTER — Other Ambulatory Visit: Payer: Self-pay

## 2021-12-13 MED ORDER — ALBUTEROL SULFATE HFA 108 (90 BASE) MCG/ACT IN AERS
INHALATION_SPRAY | RESPIRATORY_TRACT | 2 refills | Status: DC
  Filled 2021-12-13: qty 18, fill #0
  Filled 2021-12-16: qty 18, 25d supply, fill #0
  Filled 2022-01-08: qty 18, 25d supply, fill #1
  Filled 2022-01-12 – 2022-01-27 (×3): qty 18, 25d supply, fill #2

## 2021-12-13 NOTE — Telephone Encounter (Signed)
Requested Prescriptions  Pending Prescriptions Disp Refills  . albuterol (VENTOLIN HFA) 108 (90 Base) MCG/ACT inhaler 18 g 2    Sig: TAKE 2 PUFF EVERY 6 HOURS AS NEEDED FOR WHEEZING     Pulmonology:  Beta Agonists 2 Failed - 12/12/2021 11:40 AM      Failed - Last BP in normal range    BP Readings from Last 1 Encounters:  10/26/21 (!) 144/94         Passed - Last Heart Rate in normal range    Pulse Readings from Last 1 Encounters:  10/26/21 (!) 55         Passed - Valid encounter within last 12 months    Recent Outpatient Visits          10 months ago Spinal stenosis in cervical region   [facility] Grayce Sessions, NP   1 year ago Umbilical hernia with obstruction, without gangrene   [facility] Grayce Sessions, NP   1 year ago Acute bilateral low back pain, unspecified whether sciatica present   [facility] Grayce Sessions, NP   1 year ago Acute bilateral low back pain, unspecified whether sciatica present   [facility] Grayce Sessions, NP   2 years ago Bilateral impacted cerumen   [facility] Grayce Sessions, NP

## 2021-12-20 ENCOUNTER — Other Ambulatory Visit: Payer: Self-pay

## 2021-12-26 ENCOUNTER — Other Ambulatory Visit: Payer: Self-pay

## 2021-12-30 ENCOUNTER — Other Ambulatory Visit: Payer: Self-pay

## 2022-01-08 ENCOUNTER — Ambulatory Visit
Admission: EM | Admit: 2022-01-08 | Discharge: 2022-01-08 | Disposition: A | Discharge status: Home / Self Care | Payer: Medicaid Other | Attending: Physician Assistant | Admitting: Physician Assistant

## 2022-01-08 DIAGNOSIS — R300 Dysuria: Secondary | ICD-10-CM | POA: Insufficient documentation

## 2022-01-08 LAB — POCT URINALYSIS DIP (MANUAL ENTRY)
Bilirubin, UA: NEGATIVE
Blood, UA: NEGATIVE
Glucose, UA: NEGATIVE mg/dL
Ketones, POC UA: NEGATIVE mg/dL
Leukocytes, UA: NEGATIVE
Nitrite, UA: NEGATIVE
Protein Ur, POC: NEGATIVE mg/dL
Spec Grav, UA: 1.01 (ref 1.010–1.025)
Urobilinogen, UA: 0.2 E.U./dL
pH, UA: 7 (ref 5.0–8.0)

## 2022-01-08 NOTE — ED Triage Notes (Signed)
Pt  states that he has some lower abdominal pain and pain with urination. X5 days

## 2022-01-08 NOTE — ED Provider Notes (Signed)
[facility]    CSN: [id number] Arrival date & time: 01/08/22  0934      History   Chief Complaint Chief Complaint  Patient presents with   Abdominal Pain    Abdominal pain and pain with urination. X5 days    HPI Shawn Raymond is a 41 y.o. male.   Patient here today for evaluation of lower abdominal pain that started about 5 days ago.  He also reports some pain with urination.  He has had some clear discharge at times.  He denies any fever, nausea, vomiting or diarrhea.  He has not had any blood in his stool.  The history is provided by the patient.  Abdominal Pain Associated symptoms: dysuria   Associated symptoms: no chills, no constipation, no diarrhea, no fever, no nausea and no vomiting     Past Medical History:  Diagnosis Date   Asthma    Sarcoidosis     Patient Active Problem List   Diagnosis Date Noted   Stenosis of cervical spine with myelopathy (HCC) 04/02/2018   Cervical disc disorder with radiculopathy of cervical region 01/25/2018   Spinal stenosis in cervical region 01/25/2018   Cervicalgia 01/25/2018   Heartburn 01/25/2018   FRACTURE, ANKLE, RIGHT 08/09/2009   ANKLE SPRAIN, RIGHT 08/09/2009   COCAINE ABUSE 08/03/2009   TOBACCO ABUSE 08/02/2009   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   INTRINSIC ASTHMA, UNSPECIFIED 03/23/2009   Nonspecific (abnormal) findings on radiological and other examination of body structure 01/07/2009   CT, CHEST, ABNORMAL 01/07/2009    Past Surgical History:  Procedure Laterality Date   ANTERIOR CERVICAL DECOMP/DISCECTOMY FUSION N/A 04/02/2018   Procedure: ANTERIOR CERVICAL DECOMPRESSION/DISCECTOMY FUSION CERVICAL SIX- CERVICAL SEVEN ;  Surgeon: Lisbeth Renshaw, MD;  [facility];  Service: Neurosurgery;  Laterality: N/A;   FEMUR FRACTURE SURGERY     PATELLA RECONSTRUCTION     POSTERIOR CERVICAL FUSION/FORAMINOTOMY N/A 04/02/2018   Procedure: CERVICAL THREE- CERVICAL FOUR, CERVICAL FOUR- CERVICAL FIVE,  CERVICAL FIVE- CERVICAL SIX, CERVICAL SIX- CERVICAL SEVEN LAMINECTOMY WITH LATERAL MASS POSTERIOR SEGMENTAL INSTRUEMNTATION, POSTERIOR LATERAL ARTHRODESIS;  Surgeon: Lisbeth Renshaw, MD;  [facility];  Service: Neurosurgery;  Laterality: N/A;       Home Medications    Prior to Admission medications   Medication Sig Start Date End Date Taking? Authorizing Provider  albuterol (VENTOLIN HFA) 108 (90 Base) MCG/ACT inhaler TAKE 2 PUFF EVERY 6 HOURS AS NEEDED FOR WHEEZING 12/13/21 12/13/22 Yes Grayce Sessions, NP  budesonide-formoterol (SYMBICORT) 160-4.5 MCG/ACT inhaler Inhale 2 puffs into the lungs 2 (two) times daily. 09/13/21  Yes Grayce Sessions, NP  naproxen sodium (ANAPROX DS) 550 MG tablet Take 1 tablet (550 mg total) by mouth 2 (two) times daily with a meal. 10/26/21  Yes Ellsworth Lennox, PA-C  omeprazole (PRILOSEC) 40 MG capsule TAKE 1 CAPSULE (40 MG TOTAL) BY MOUTH DAILY. 10/13/21 10/13/22 Yes Grayce Sessions, NP  oxybutynin (DITROPAN-XL) 10 MG 24 hr tablet Take 1 tab by mouth daily as directed 10/28/20  Yes Abu-Salha, Donnetta Simpers, MD  methocarbamol (ROBAXIN) 500 MG tablet TAKE 1 TABLET (500 MG TOTAL) BY MOUTH 3 (THREE) TIMES DAILY. 10/23/21 10/23/22  Grayce Sessions, NP    Family History Family History  Problem Relation Age of Onset   Healthy Mother    Healthy Father     Social History Social History   Tobacco Use   Smoking status: Former    Packs/day: 0.70    Types: Cigarettes    Quit date: 12/24/2017  Years since quitting: 4.0   Smokeless tobacco: Never   Tobacco comments:    patient has been  without a cigarette since 12/27/17  Vaping Use   Vaping Use: Never used  Substance Use Topics   Alcohol use: No   Drug use: Yes    Types: Marijuana    Comment: 3grams a day.      Allergies   Iodine and Shellfish allergy   Review of Systems Review of Systems  Constitutional:  Negative for chills and fever.  Eyes:  Negative for discharge and redness.   Gastrointestinal:  Positive for abdominal pain. Negative for blood in stool, constipation, diarrhea, nausea and vomiting.  Genitourinary:  Positive for dysuria and penile discharge.  Neurological:  Negative for numbness.     Physical Exam Triage Vital Signs ED Triage Vitals  Enc Vitals Group     BP 01/08/22 1037 (!) 160/96     Pulse Rate 01/08/22 1037 (!) 55     Resp 01/08/22 1037 18     Temp 01/08/22 1037 98.1 F (36.7 C)     Temp Source 01/08/22 1037 Oral     SpO2 01/08/22 1037 95 %     Weight 01/08/22 1035 195 lb (88.5 kg)     Height 01/08/22 1035 5\' 6"  (1.676 m)     Head Circumference --      Peak Flow --      Pain Score --      Pain Loc --      Pain Edu? --      Excl. in GC? --    No data found.  Updated Vital Signs BP (!) 160/96 (BP Location: Right Arm)   Pulse (!) 55   Temp 98.1 F (36.7 C) (Oral)   Resp 18   Ht 5\' 6"  (1.676 m)   Wt 195 lb (88.5 kg)   SpO2 95%   BMI 31.47 kg/m      Physical Exam Vitals and nursing note reviewed.  Constitutional:      General: He is not in acute distress.    Appearance: Normal appearance. He is not ill-appearing.  HENT:     Head: Normocephalic and atraumatic.  Eyes:     Conjunctiva/sclera: Conjunctivae normal.  Cardiovascular:     Rate and Rhythm: Normal rate.  Pulmonary:     Effort: Pulmonary effort is normal.  Neurological:     Mental Status: He is alert.  Psychiatric:        Mood and Affect: Mood normal.        Behavior: Behavior normal.        Thought Content: Thought content normal.      UC Treatments / Results  Labs (all labs ordered are listed, but only abnormal results are displayed) Labs Reviewed  URINE CULTURE  POCT URINALYSIS DIP (MANUAL ENTRY)  CYTOLOGY, (ORAL, ANAL, URETHRAL) ANCILLARY ONLY    EKG   Radiology No results found.  Procedures Procedures (including critical care time)  Medications Ordered in UC Medications - No data to display  Initial Impression / Assessment and  Plan / UC Course  I have reviewed the triage vital signs and the nursing notes.  Pertinent labs & imaging results that were available during my care of the patient were reviewed by me and considered in my medical decision making (see chart for details).    UA without signs of UTI.  Will order urine culture as well as STD screening.  Recommended he abstain from sexual intercourse while awaiting  STD results.  Patient declines blood work today.  Encouraged follow-up with any further concerns.  Final Clinical Impressions(s) / UC Diagnoses   Final diagnoses:  Dysuria   Discharge Instructions   None    ED Prescriptions   None    PDMP not reviewed this encounter.   Francene Finders, PA-C 01/08/22 1127

## 2022-01-09 ENCOUNTER — Other Ambulatory Visit: Payer: Self-pay

## 2022-01-09 LAB — CYTOLOGY, (ORAL, ANAL, URETHRAL) ANCILLARY ONLY
Chlamydia: NEGATIVE
Comment: NEGATIVE
Comment: NEGATIVE
Comment: NORMAL
Neisseria Gonorrhea: NEGATIVE
Trichomonas: NEGATIVE

## 2022-01-09 LAB — URINE CULTURE: Culture: <10000 — AB

## 2022-01-12 ENCOUNTER — Other Ambulatory Visit (INDEPENDENT_AMBULATORY_CARE_PROVIDER_SITE_OTHER): Payer: Self-pay | Admitting: Primary Care

## 2022-01-12 ENCOUNTER — Other Ambulatory Visit: Payer: Self-pay

## 2022-01-12 DIAGNOSIS — R12 Heartburn: Secondary | ICD-10-CM

## 2022-01-12 DIAGNOSIS — Z760 Encounter for issue of repeat prescription: Secondary | ICD-10-CM

## 2022-01-12 MED ORDER — OMEPRAZOLE 40 MG PO CPDR
40.0000 mg | DELAYED_RELEASE_CAPSULE | Freq: Every day | ORAL | 0 refills | Status: DC
  Filled 2022-01-12: qty 30, 30d supply, fill #0

## 2022-01-12 NOTE — Telephone Encounter (Signed)
Requested Prescriptions  Pending Prescriptions Disp Refills  . omeprazole (PRILOSEC) 40 MG capsule 30 capsule 0    Sig: TAKE 1 CAPSULE (40 MG TOTAL) BY MOUTH DAILY.     Gastroenterology: Proton Pump Inhibitors Passed - 01/12/2022 12:01 PM      Passed - Valid encounter within last 12 months    Recent Outpatient Visits          11 months ago Spinal stenosis in cervical region   [place], Michelle P, NP   1 year ago Umbilical hernia with obstruction, without gangrene   [place] Kerin Perna, NP   1 year ago Acute bilateral low back pain, unspecified whether sciatica present   [place], Elfrida, NP   1 year ago Acute bilateral low back pain, unspecified whether sciatica present   [place], NP   2 years ago Bilateral impacted cerumen   [place] Kerin Perna, NP

## 2022-01-16 ENCOUNTER — Other Ambulatory Visit: Payer: Self-pay

## 2022-01-18 ENCOUNTER — Other Ambulatory Visit: Payer: Self-pay

## 2022-01-20 ENCOUNTER — Other Ambulatory Visit: Payer: Self-pay

## 2022-01-23 ENCOUNTER — Other Ambulatory Visit: Payer: Self-pay

## 2022-01-27 ENCOUNTER — Other Ambulatory Visit: Payer: Self-pay

## 2022-01-31 ENCOUNTER — Other Ambulatory Visit: Payer: Self-pay

## 2022-02-11 ENCOUNTER — Other Ambulatory Visit (INDEPENDENT_AMBULATORY_CARE_PROVIDER_SITE_OTHER): Payer: Self-pay | Admitting: Primary Care

## 2022-02-11 DIAGNOSIS — R12 Heartburn: Secondary | ICD-10-CM

## 2022-02-11 DIAGNOSIS — M5412 Radiculopathy, cervical region: Secondary | ICD-10-CM

## 2022-02-11 DIAGNOSIS — Z760 Encounter for issue of repeat prescription: Secondary | ICD-10-CM

## 2022-02-11 DIAGNOSIS — J45909 Unspecified asthma, uncomplicated: Secondary | ICD-10-CM

## 2022-02-13 ENCOUNTER — Other Ambulatory Visit: Payer: Self-pay

## 2022-02-13 NOTE — Telephone Encounter (Signed)
Requested medications are due for refill today.  yes  Requested medications are on the active medications list.  yes  Last refill. varied  Future visit scheduled.   no  Notes to clinic.  Pt last seen in office 02/02/2021. Robaxin is not delegated.    Requested Prescriptions  Pending Prescriptions Disp Refills   methocarbamol (ROBAXIN) 500 MG tablet 60 tablet 2    Sig: TAKE 1 TABLET (500 MG TOTAL) BY MOUTH 3 (THREE) TIMES DAILY.     Not Delegated - Analgesics:  Muscle Relaxants Failed - 02/11/2022  4:53 PM      Failed - This refill cannot be delegated      Failed - Valid encounter within last 6 months    Recent Outpatient Visits           1 year ago Spinal stenosis in cervical region   [facility] Grayce SessionsEdwards, Michelle P, NP   1 year ago Umbilical hernia with obstruction, without gangrene   [facility] Grayce SessionsEdwards, Michelle P, NP   1 year ago Acute bilateral low back pain, unspecified whether sciatica present   [facility] Grayce SessionsEdwards, Michelle P, NP   1 year ago Acute bilateral low back pain, unspecified whether sciatica present   [facility] Grayce SessionsEdwards, Michelle P, NP   2 years ago Bilateral impacted cerumen   [facility] [place], Michelle P, NP               albuterol (VENTOLIN HFA) 108 (90 Base) MCG/ACT inhaler 18 g 2    Sig: TAKE 2 PUFF EVERY 6 HOURS AS NEEDED FOR WHEEZING     Pulmonology:  Beta Agonists 2 Failed - 02/11/2022  4:53 PM      Failed - Last BP in normal range    BP Readings from Last 1 Encounters:  01/08/22 (!) 160/96         Failed - Valid encounter within last 12 months    Recent Outpatient Visits           1 year ago Spinal stenosis in cervical region   [facility] Grayce SessionsEdwards, Michelle P, NP   1 year ago Umbilical hernia with obstruction, without gangrene   [facility] Grayce SessionsEdwards, Michelle P, NP   1  year ago Acute bilateral low back pain, unspecified whether sciatica present   [facility] Grayce SessionsEdwards, Michelle P, NP   1 year ago Acute bilateral low back pain, unspecified whether sciatica present   [facility] Grayce SessionsEdwards, Michelle P, NP   2 years ago Bilateral impacted cerumen   [facility] Grayce SessionsEdwards, Michelle P, NP              Passed - Last Heart Rate in normal range    Pulse Readings from Last 1 Encounters:  01/08/22 (!) 55          omeprazole (PRILOSEC) 40 MG capsule 30 capsule 0    Sig: TAKE 1 CAPSULE (40 MG TOTAL) BY MOUTH DAILY.     Gastroenterology: Proton Pump Inhibitors Failed - 02/11/2022  4:53 PM      Failed - Valid encounter within last 12 months    Recent Outpatient Visits           1 year ago Spinal stenosis in cervical region   [facility] Grayce SessionsEdwards, Michelle P, NP  1 year ago Umbilical hernia with obstruction, without gangrene   [facility] Kerin Perna, NP   1 year ago Acute bilateral low back pain, unspecified whether sciatica present   [place], [place], NP   1 year ago Acute bilateral low back pain, unspecified whether sciatica present   [place], [place], NP   2 years ago Bilateral impacted cerumen   [place] Kerin Perna, NP

## 2022-02-15 ENCOUNTER — Other Ambulatory Visit: Payer: Self-pay

## 2022-02-20 ENCOUNTER — Other Ambulatory Visit: Payer: Self-pay

## 2022-02-20 ENCOUNTER — Other Ambulatory Visit (INDEPENDENT_AMBULATORY_CARE_PROVIDER_SITE_OTHER): Payer: Self-pay | Admitting: Primary Care

## 2022-02-20 DIAGNOSIS — M5412 Radiculopathy, cervical region: Secondary | ICD-10-CM

## 2022-02-20 DIAGNOSIS — J45909 Unspecified asthma, uncomplicated: Secondary | ICD-10-CM

## 2022-02-20 DIAGNOSIS — Z760 Encounter for issue of repeat prescription: Secondary | ICD-10-CM

## 2022-02-20 DIAGNOSIS — R12 Heartburn: Secondary | ICD-10-CM

## 2022-02-21 NOTE — Telephone Encounter (Signed)
Requested medications are due for refill today.  yes  Requested medications are on the active medications list.  yes  Last refill. varies  Future visit scheduled.   With a new practice - new provider  Notes to clinic.  Pt overdue for OV.    Requested Prescriptions  Pending Prescriptions Disp Refills   methocarbamol (ROBAXIN) 500 MG tablet 60 tablet 2    Sig: TAKE 1 TABLET (500 MG TOTAL) BY MOUTH 3 (THREE) TIMES DAILY.     Not Delegated - Analgesics:  Muscle Relaxants Failed - 02/20/2022  7:34 AM      Failed - This refill cannot be delegated      Failed - Valid encounter within last 6 months    Recent Outpatient Visits           1 year ago Spinal stenosis in cervical region   [place], Michelle P, NP   1 year ago Umbilical hernia with obstruction, without gangrene   [place] Kerin Perna, NP   1 year ago Acute bilateral low back pain, unspecified whether sciatica present   [place], [place], NP   2 years ago Acute bilateral low back pain, unspecified whether sciatica present   [place], [place], NP   2 years ago Bilateral impacted cerumen   [place], [place], NP       Future Appointments             In 2 months Ronnell Freshwater, NP [facility]             albuterol (VENTOLIN HFA) 108 (90 Base) MCG/ACT inhaler 18 g 2    Sig: TAKE 2 PUFF EVERY 6 HOURS AS NEEDED FOR WHEEZING     Pulmonology:  Beta Agonists 2 Failed - 02/20/2022  7:34 AM      Failed - Last BP in normal range    BP Readings from Last 1 Encounters:  01/08/22 (!) 160/96         Failed - Valid encounter within last 12 months    Recent Outpatient Visits           1 year ago Spinal stenosis in cervical region   [place], [place], NP   1 year ago Umbilical  hernia with obstruction, without gangrene   [name] Kerin Perna, NP   1 year ago Acute bilateral low back pain, unspecified whether sciatica present   [place], [place], NP   2 years ago Acute bilateral low back pain, unspecified whether sciatica present   [place], [place], NP   2 years ago Bilateral impacted cerumen   [place], [place], NP       Future Appointments             In 2 months Ronnell Freshwater, NP [place] [facility] [place] in normal range    Pulse Readings from Last 1 Encounters:  01/08/22 (!) 55          omeprazole (PRILOSEC) 40 MG capsule 30 capsule 0    Sig: TAKE 1 CAPSULE (40 MG TOTAL) BY MOUTH DAILY.  Gastroenterology: Proton Pump Inhibitors Failed - 02/20/2022  7:34 AM      Failed - Valid encounter within last 12 months    Recent Outpatient Visits           1 year ago Spinal stenosis in cervical region   [place], Michelle P, NP   1 year ago Umbilical hernia with obstruction, without gangrene   [place] Kerin Perna, NP   1 year ago Acute bilateral low back pain, unspecified whether sciatica present   [place], [place], NP   2 years ago Acute bilateral low back pain, unspecified whether sciatica present   [place], [place], NP   2 years ago Bilateral impacted cerumen   [place], [place], NP       Future Appointments             In 2 months [place], Greer Ee, NP [facility]

## 2022-02-23 ENCOUNTER — Other Ambulatory Visit: Payer: Self-pay

## 2022-02-23 ENCOUNTER — Telehealth: Payer: Medicaid Other | Admitting: Family Medicine

## 2022-02-23 DIAGNOSIS — J4540 Moderate persistent asthma, uncomplicated: Secondary | ICD-10-CM | POA: Diagnosis not present

## 2022-02-23 MED ORDER — ALBUTEROL SULFATE HFA 108 (90 BASE) MCG/ACT IN AERS
2.0000 | INHALATION_SPRAY | Freq: Four times a day (QID) | RESPIRATORY_TRACT | 0 refills | Status: DC | PRN
  Filled 2022-02-23: qty 18, 25d supply, fill #0

## 2022-02-23 NOTE — Patient Instructions (Signed)
  Shawn Raymond, thank you for joining Shawn Finner, NP for today's virtual visit.  While this provider is not your primary care provider (PCP), if your PCP is located in our provider database this encounter information will be shared with them immediately following your visit.   A [facility] MyChart account gives you access to today's visit and all your visits, tests, and labs performed at [facility] " click here if you don't have a [facility] MyChart account or go to [email or web address]  Consent: (Patient) Shawn Raymond provided verbal consent for this virtual visit at the beginning of the encounter.  Current Medications:  Current Outpatient Medications:    albuterol (VENTOLIN HFA) 108 (90 Base) MCG/ACT inhaler, Inhale 2 puffs into the lungs every 6 (six) hours as needed for wheezing or shortness of breath., Disp: 8 g, Rfl: 0   albuterol (VENTOLIN HFA) 108 (90 Base) MCG/ACT inhaler, TAKE 2 PUFF EVERY 6 HOURS AS NEEDED FOR WHEEZING, Disp: 18 g, Rfl: 2   budesonide-formoterol (SYMBICORT) 160-4.5 MCG/ACT inhaler, Inhale 2 puffs into the lungs 2 (two) times daily., Disp: 10.2 g, Rfl: 6   methocarbamol (ROBAXIN) 500 MG tablet, TAKE 1 TABLET (500 MG TOTAL) BY MOUTH 3 (THREE) TIMES DAILY., Disp: 60 tablet, Rfl: 2   naproxen sodium (ANAPROX DS) 550 MG tablet, Take 1 tablet (550 mg total) by mouth 2 (two) times daily with a meal., Disp: 14 tablet, Rfl: 0   omeprazole (PRILOSEC) 40 MG capsule, TAKE 1 CAPSULE (40 MG TOTAL) BY MOUTH DAILY., Disp: 30 capsule, Rfl: 0   oxybutynin (DITROPAN-XL) 10 MG 24 hr tablet, Take 1 tab by mouth daily as directed, Disp: 30 tablet, Rfl: 11   Medications ordered in this encounter:  Meds ordered this encounter  Medications   albuterol (VENTOLIN HFA) 108 (90 Base) MCG/ACT inhaler    Sig: Inhale 2 puffs into the lungs every 6 (six) hours as needed for wheezing or shortness of breath.    Dispense:  8 g    Refill:  0    Order Specific  Question:   Supervising Provider    Answer:   Merrilee Jansky [id number]     *If you need refills on other medications prior to your next appointment, please contact your pharmacy*  Follow-Up: Call back or seek an in-person evaluation if the symptoms worsen or if the condition fails to improve as anticipated.  [facility] [phone]  Other Instructions  I am sorry to hear about your son's passing. I hope you can find comfort in his soon to be born son. Happy Holidays  Call PCP to make appt so you can get refills on all your chronic medications.  If you have been instructed to have an in-person evaluation today at a local Urgent Care facility, please use the link below. It will take you to a list of all of our available [facility], including address, phone number and hours of operation. Please do not delay care.  [facility]  If you or a family member do not have a primary care provider, use the link below to schedule a visit and establish care. When you choose a [facility] primary care physician or advanced practice provider, you gain a long-term partner in health. Find a Primary Care Provider  Learn more about [facility]'s in-office and virtual care options: [facility] - Get Care Now

## 2022-02-23 NOTE — Progress Notes (Signed)
Virtual Visit Consent   Shawn Raymond, you are scheduled for a virtual visit with a [facility] provider today. Just as with appointments in the office, your consent must be obtained to participate. Your consent will be active for this visit and any virtual visit you may have with one of our providers in the next 365 days. If you have a MyChart account, a copy of this consent can be sent to you electronically.  As this is a virtual visit, video technology does not allow for your provider to perform a traditional examination. This may limit your provider's ability to fully assess your condition. If your provider identifies any concerns that need to be evaluated in person or the need to arrange testing (such as labs, EKG, etc.), we will make arrangements to do so. Although advances in technology are sophisticated, we cannot ensure that it will always work on either your end or our end. If the connection with a video visit is poor, the visit may have to be switched to a telephone visit. With either a video or telephone visit, we are not always able to ensure that we have a secure connection.  By engaging in this virtual visit, you consent to the provision of healthcare and authorize for your insurance to be billed (if applicable) for the services provided during this visit. Depending on your insurance coverage, you may receive a charge related to this service.  I need to obtain your verbal consent now. Are you willing to proceed with your visit today? Shawn Raymond has provided verbal consent on 02/23/2022 for a virtual visit (video or telephone). Freddy Finner, NP  Date: 02/23/2022 10:10 AM  Virtual Visit via Video Note   I, Freddy Finner, connected with  Shawn Raymond  ([id number], [id number]) on 02/23/22 at 10:00 AM EST by a video-enabled telemedicine application and verified that I am speaking with the correct person using two identifiers.  Location: Patient: Virtual Visit  Location Patient: Home Provider: Virtual Visit Location Provider: Home Office   I discussed the limitations of evaluation and management by telemedicine and the availability of in person appointments. The patient expressed understanding and agreed to proceed.    History of Present Illness: Shawn Raymond is a 41 y.o. who identifies as a male who was assigned male at birth, and is being seen today for refill of inhaler- albuterol inhaler. Overall doing well, just does not want to run out of an inhaler before he can get into this PCP office for refills. Son passed away this year and he has had some stress and rough time trying to stay on top of his own care.   HPI: Asthma This is a chronic problem. The current episode started in the past 7 days. The problem occurs intermittently. The problem has been waxing and waning. His symptoms are aggravated by emotional stress, pollen, exercise and URI. His symptoms are alleviated by beta-agonist. His past medical history is significant for asthma. Past medical history comments: Pulmonary sarcoidosis.    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   INTRINSIC ASTHMA,  UNSPECIFIED 03/23/2009   Nonspecific (abnormal) findings on radiological and other examination of body structure 01/07/2009   CT, CHEST, ABNORMAL 01/07/2009    Allergies:  Allergies  Allergen Reactions   Iodine Anaphylaxis   Shellfish Allergy Anaphylaxis   Medications:  Current Outpatient Medications:    albuterol (VENTOLIN HFA) 108 (90 Base) MCG/ACT inhaler, TAKE 2 PUFF  EVERY 6 HOURS AS NEEDED FOR WHEEZING, Disp: 18 g, Rfl: 2   budesonide-formoterol (SYMBICORT) 160-4.5 MCG/ACT inhaler, Inhale 2 puffs into the lungs 2 (two) times daily., Disp: 10.2 g, Rfl: 6   methocarbamol (ROBAXIN) 500 MG tablet, TAKE 1 TABLET (500 MG TOTAL) BY MOUTH 3 (THREE) TIMES DAILY., Disp: 60 tablet, Rfl: 2   naproxen sodium (ANAPROX DS) 550 MG tablet, Take 1 tablet (550 mg total) by mouth 2 (two) times daily with a meal., Disp: 14 tablet, Rfl: 0   omeprazole (PRILOSEC) 40 MG capsule, TAKE 1 CAPSULE (40 MG TOTAL) BY MOUTH DAILY., Disp: 30 capsule, Rfl: 0   oxybutynin (DITROPAN-XL) 10 MG 24 hr tablet, Take 1 tab by mouth daily as directed, Disp: 30 tablet, Rfl: 11  Observations/Objective: Patient is well-developed, well-nourished in no acute distress.  Resting comfortably  at home.  Head is normocephalic, atraumatic.  No labored breathing.  Speech is clear and coherent with logical content.  Patient is alert and oriented at baseline.    Assessment and Plan: 1. Moderate persistent asthma without complication  - albuterol (VENTOLIN HFA) 108 (90 Base) MCG/ACT inhaler; Inhale 2 puffs into the lungs every 6 (six) hours as needed for wheezing or shortness of breath.  Dispense: 8 g; Refill: 0  -asthma is well controlled, only needs refill today. -aware of one time refill -understands he needs to be seen by PCP for other refills and on going care needs  -will be making an appt to get these.   Reviewed side effects, risks and benefits of medication.    Patient acknowledged agreement and understanding of the plan.   Past Medical, Surgical, Social History, Allergies, and Medications have been Reviewed.   Follow Up Instructions: I discussed the assessment and treatment plan with the patient. The patient was provided an opportunity to ask questions and all were answered. The patient agreed with the plan and demonstrated an understanding of the instructions.  A copy of instructions were  sent to the patient via MyChart unless otherwise noted below.     The patient was advised to call back or seek an in-person evaluation if the symptoms worsen or if the condition fails to improve as anticipated.  Time:  I spent 10 minutes with the patient via telehealth technology discussing the above problems/concerns.    Freddy Finner, NP

## 2022-02-24 ENCOUNTER — Other Ambulatory Visit: Payer: Self-pay

## 2022-02-27 ENCOUNTER — Other Ambulatory Visit: Payer: Self-pay

## 2022-03-20 ENCOUNTER — Other Ambulatory Visit (INDEPENDENT_AMBULATORY_CARE_PROVIDER_SITE_OTHER): Payer: Self-pay | Admitting: Primary Care

## 2022-03-20 ENCOUNTER — Other Ambulatory Visit: Payer: Self-pay | Admitting: Family Medicine

## 2022-03-20 DIAGNOSIS — R12 Heartburn: Secondary | ICD-10-CM

## 2022-03-20 DIAGNOSIS — Z760 Encounter for issue of repeat prescription: Secondary | ICD-10-CM

## 2022-03-20 DIAGNOSIS — M5412 Radiculopathy, cervical region: Secondary | ICD-10-CM

## 2022-03-20 DIAGNOSIS — J4540 Moderate persistent asthma, uncomplicated: Secondary | ICD-10-CM

## 2022-03-21 ENCOUNTER — Other Ambulatory Visit: Payer: Self-pay

## 2022-03-21 NOTE — Telephone Encounter (Signed)
Requested medication (s) are due for refill today:routing for review  Requested medication (s) are on the active medication list: yes  Last refill:  10/23/21  Future visit scheduled:yes  Notes to clinic:  Unable to refill per protocol, cannot delegate.      Requested Prescriptions  Pending Prescriptions Disp Refills   methocarbamol (ROBAXIN) 500 MG tablet 60 tablet 2    Sig: TAKE 1 TABLET (500 MG TOTAL) BY MOUTH 3 (THREE) TIMES DAILY.     Not Delegated - Analgesics:  Muscle Relaxants Failed - 03/20/2022  7:08 PM      Failed - This refill cannot be delegated      Failed - Valid encounter within last 6 months    Recent Outpatient Visits           1 year ago Spinal stenosis in cervical region   [facility] Grayce Sessions, NP   1 year ago Umbilical hernia with obstruction, without gangrene   [facility] Grayce Sessions, NP   1 year ago Acute bilateral low back pain, unspecified whether sciatica present   [facility] Grayce Sessions, NP   2 years ago Acute bilateral low back pain, unspecified whether sciatica present   [facility] Grayce Sessions, NP   2 years ago Bilateral impacted cerumen   [facility] Grayce Sessions, NP       Future Appointments             In 2 days Randa Evens, Kinnie Scales, NP [facility]   In 1 month Carlean Jews, NP [facility]             omeprazole (PRILOSEC) 40 MG capsule 30 capsule 0    Sig: TAKE 1 CAPSULE (40 MG TOTAL) BY MOUTH DAILY.     Gastroenterology: Proton Pump Inhibitors Failed - 03/20/2022  7:08 PM      Failed - Valid encounter within last 12 months    Recent Outpatient Visits           1 year ago Spinal stenosis in cervical region   [facility] Grayce Sessions, NP   1 year ago Umbilical hernia with obstruction,  without gangrene   [facility] Grayce Sessions, NP   1 year ago Acute bilateral low back pain, unspecified whether sciatica present   [facility] Grayce Sessions, NP   2 years ago Acute bilateral low back pain, unspecified whether sciatica present   [facility] Grayce Sessions, NP   2 years ago Bilateral impacted cerumen   [facility] Grayce Sessions, NP       Future Appointments             In 2 days Randa Evens, Kinnie Scales, NP [facility]   In 1 month Boscia, Kathlynn Grate, NP [facility]

## 2022-03-23 ENCOUNTER — Encounter (INDEPENDENT_AMBULATORY_CARE_PROVIDER_SITE_OTHER): Payer: Self-pay | Admitting: Primary Care

## 2022-03-23 ENCOUNTER — Other Ambulatory Visit: Payer: Self-pay

## 2022-03-23 ENCOUNTER — Ambulatory Visit (INDEPENDENT_AMBULATORY_CARE_PROVIDER_SITE_OTHER): Payer: Medicaid Other | Admitting: Primary Care

## 2022-03-23 VITALS — BP 124/82 | HR 64 | Ht 66.0 in | Wt 189.6 lb

## 2022-03-23 DIAGNOSIS — I10 Essential (primary) hypertension: Secondary | ICD-10-CM | POA: Diagnosis not present

## 2022-03-23 DIAGNOSIS — E6609 Other obesity due to excess calories: Secondary | ICD-10-CM

## 2022-03-23 DIAGNOSIS — Z760 Encounter for issue of repeat prescription: Secondary | ICD-10-CM | POA: Diagnosis not present

## 2022-03-23 DIAGNOSIS — R12 Heartburn: Secondary | ICD-10-CM

## 2022-03-23 DIAGNOSIS — M5412 Radiculopathy, cervical region: Secondary | ICD-10-CM | POA: Diagnosis not present

## 2022-03-23 DIAGNOSIS — Z6830 Body mass index (BMI) 30.0-30.9, adult: Secondary | ICD-10-CM

## 2022-03-23 DIAGNOSIS — E66811 Obesity, class 1: Secondary | ICD-10-CM

## 2022-03-23 DIAGNOSIS — M545 Low back pain, unspecified: Secondary | ICD-10-CM

## 2022-03-23 DIAGNOSIS — Z1159 Encounter for screening for other viral diseases: Secondary | ICD-10-CM

## 2022-03-23 DIAGNOSIS — J4540 Moderate persistent asthma, uncomplicated: Secondary | ICD-10-CM

## 2022-03-23 DIAGNOSIS — G8929 Other chronic pain: Secondary | ICD-10-CM

## 2022-03-23 MED ORDER — METHOCARBAMOL 500 MG PO TABS
ORAL_TABLET | Freq: Three times a day (TID) | ORAL | 2 refills | Status: AC
  Filled 2022-03-23: qty 60, 20d supply, fill #0
  Filled 2022-04-07 – 2023-01-01 (×5): qty 60, 20d supply, fill #1
  Filled 2023-02-05: qty 60, 20d supply, fill #2

## 2022-03-23 MED ORDER — OMEPRAZOLE 40 MG PO CPDR
40.0000 mg | DELAYED_RELEASE_CAPSULE | Freq: Every day | ORAL | 0 refills | Status: DC
  Filled 2022-03-23: qty 30, 30d supply, fill #0

## 2022-03-23 MED ORDER — BUDESONIDE-FORMOTEROL FUMARATE 160-4.5 MCG/ACT IN AERO
2.0000 | INHALATION_SPRAY | Freq: Two times a day (BID) | RESPIRATORY_TRACT | 6 refills | Status: DC
  Filled 2022-04-07 – 2022-04-17 (×2): qty 10.2, 30d supply, fill #0
  Filled 2022-05-12: qty 10.2, 30d supply, fill #1
  Filled 2022-05-24 – 2022-06-06 (×2): qty 10.2, 30d supply, fill #2
  Filled 2022-06-28: qty 10.2, 30d supply, fill #3
  Filled 2022-07-16 – 2022-07-20 (×4): qty 10.2, 30d supply, fill #4
  Filled 2022-08-09 – 2022-08-11 (×2): qty 10.2, 30d supply, fill #5
  Filled 2022-08-30 – 2022-09-04 (×2): qty 10.2, 30d supply, fill #6
  Filled ????-??-??: fill #4

## 2022-03-23 MED ORDER — OXYBUTYNIN CHLORIDE ER 10 MG PO TB24
10.0000 mg | ORAL_TABLET | Freq: Every day | ORAL | 11 refills | Status: DC
  Filled 2022-03-23: qty 30, 30d supply, fill #0
  Filled 2022-04-17: qty 30, 30d supply, fill #1

## 2022-03-23 MED ORDER — ALBUTEROL SULFATE HFA 108 (90 BASE) MCG/ACT IN AERS
2.0000 | INHALATION_SPRAY | Freq: Four times a day (QID) | RESPIRATORY_TRACT | 0 refills | Status: DC | PRN
  Filled 2022-03-23: qty 18, 25d supply, fill #0

## 2022-03-23 NOTE — Patient Instructions (Signed)
Calorie Counting for Weight Loss Calories are units of energy. Your body needs a certain number of calories from food to keep going throughout the day. When you eat or drink more calories than your body needs, your body stores the extra calories mostly as fat. When you eat or drink fewer calories than your body needs, your body burns fat to get the energy it needs. Calorie counting means keeping track of how many calories you eat and drink each day. Calorie counting can be helpful if you need to lose weight. If you eat fewer calories than your body needs, you should lose weight. Ask your health care provider what a healthy weight is for you. For calorie counting to work, you will need to eat the right number of calories each day to lose a healthy amount of weight per week. A dietitian can help you figure out how many calories you need in a day and will suggest ways to reach your calorie goal. A healthy amount of weight to lose each week is usually 1-2 lb (0.5-0.9 kg). This usually means that your daily calorie intake should be reduced by 500-750 calories. Eating 1,200-1,500 calories a day can help most women lose weight. Eating 1,500-1,800 calories a day can help most men lose weight. What do I need to know about calorie counting? Work with your health care provider or dietitian to determine how many calories you should get each day. To meet your daily calorie goal, you will need to: Find out how many calories are in each food that you would like to eat. Try to do this before you eat. Decide how much of the food you plan to eat. Keep a food log. Do this by writing down what you ate and how many calories it had. To successfully lose weight, it is important to balance calorie counting with a healthy lifestyle that includes regular activity. Where do I find calorie information?  The number of calories in a food can be found on a Nutrition Facts label. If a food does not have a Nutrition Facts label, try  to look up the calories online or ask your dietitian for help. Remember that calories are listed per serving. If you choose to have more than one serving of a food, you will have to multiply the calories per serving by the number of servings you plan to eat. For example, the label on a package of bread might say that a serving size is 1 slice and that there are 90 calories in a serving. If you eat 1 slice, you will have eaten 90 calories. If you eat 2 slices, you will have eaten 180 calories. How do I keep a food log? After each time that you eat, record the following in your food log as soon as possible: What you ate. Be sure to include toppings, sauces, and other extras on the food. How much you ate. This can be measured in cups, ounces, or number of items. How many calories were in each food and drink. The total number of calories in the food you ate. Keep your food log near you, such as in a pocket-sized notebook or on an app or website on your mobile phone. Some programs will calculate calories for you and show you how many calories you have left to meet your daily goal. What are some portion-control tips? Know how many calories are in a serving. This will help you know how many servings you can have of a certain   food. Use a measuring cup to measure serving sizes. You could also try weighing out portions on a kitchen scale. With time, you will be able to estimate serving sizes for some foods. Take time to put servings of different foods on your favorite plates or in your favorite bowls and cups so you know what a serving looks like. Try not to eat straight from a food's packaging, such as from a bag or box. Eating straight from the package makes it hard to see how much you are eating and can lead to overeating. Put the amount you would like to eat in a cup or on a plate to make sure you are eating the right portion. Use smaller plates, glasses, and bowls for smaller portions and to prevent  overeating. Try not to multitask. For example, avoid watching TV or using your computer while eating. If it is time to eat, sit down at a table and enjoy your food. This will help you recognize when you are full. It will also help you be more mindful of what and how much you are eating. What are tips for following this plan? Reading food labels Check the calorie count compared with the serving size. The serving size may be smaller than what you are used to eating. Check the source of the calories. Try to choose foods that are high in protein, fiber, and vitamins, and low in saturated fat, trans fat, and sodium. Shopping Read nutrition labels while you shop. This will help you make healthy decisions about which foods to buy. Pay attention to nutrition labels for low-fat or fat-free foods. These foods sometimes have the same number of calories or more calories than the full-fat versions. They also often have added sugar, starch, or salt to make up for flavor that was removed with the fat. Make a grocery list of lower-calorie foods and stick to it. Cooking Try to cook your favorite foods in a healthier way. For example, try baking instead of frying. Use low-fat dairy products. Meal planning Use more fruits and vegetables. One-half of your plate should be fruits and vegetables. Include lean proteins, such as chicken, turkey, and fish. Lifestyle Each week, aim to do one of the following: 150 minutes of moderate exercise, such as walking. 75 minutes of vigorous exercise, such as running. General information Know how many calories are in the foods you eat most often. This will help you calculate calorie counts faster. Find a way of tracking calories that works for you. Get creative. Try different apps or programs if writing down calories does not work for you. What foods should I eat?  Eat nutritious foods. It is better to have a nutritious, high-calorie food, such as an avocado, than a food with  few nutrients, such as a bag of potato chips. Use your calories on foods and drinks that will fill you up and will not leave you hungry soon after eating. Examples of foods that fill you up are nuts and nut butters, vegetables, lean proteins, and high-fiber foods such as whole grains. High-fiber foods are foods with more than 5 g of fiber per serving. Pay attention to calories in drinks. Low-calorie drinks include water and unsweetened drinks. The items listed above may not be a complete list of foods and beverages you can eat. Contact a dietitian for more information. What foods should I limit? Limit foods or drinks that are not good sources of vitamins, minerals, or protein or that are high in unhealthy fats. These   include: Candy. Other sweets. Sodas, specialty coffee drinks, alcohol, and juice. The items listed above may not be a complete list of foods and beverages you should avoid. Contact a dietitian for more information. How do I count calories when eating out? Pay attention to portions. Often, portions are much larger when eating out. Try these tips to keep portions smaller: Consider sharing a meal instead of getting your own. If you get your own meal, eat only half of it. Before you start eating, ask for a container and put half of your meal into it. When available, consider ordering smaller portions from the menu instead of full portions. Pay attention to your food and drink choices. Knowing the way food is cooked and what is included with the meal can help you eat fewer calories. If calories are listed on the menu, choose the lower-calorie options. Choose dishes that include vegetables, fruits, whole grains, low-fat dairy products, and lean proteins. Choose items that are boiled, broiled, grilled, or steamed. Avoid items that are buttered, battered, fried, or served with cream sauce. Items labeled as crispy are usually fried, unless stated otherwise. Choose water, low-fat milk,  unsweetened iced tea, or other drinks without added sugar. If you want an alcoholic beverage, choose a lower-calorie option, such as a glass of wine or light beer. Ask for dressings, sauces, and syrups on the side. These are usually high in calories, so you should limit the amount you eat. If you want a salad, choose a garden salad and ask for grilled meats. Avoid extra toppings such as bacon, cheese, or fried items. Ask for the dressing on the side, or ask for olive oil and vinegar or lemon to use as dressing. Estimate how many servings of a food you are given. Knowing serving sizes will help you be aware of how much food you are eating at restaurants. Where to find more information Centers for Disease Control and Prevention: http://www.wolf.info/ U.[place]. Department of Agriculture: http://www.wilson-mendoza.org/ Summary Calorie counting means keeping track of how many calories you eat and drink each day. If you eat fewer calories than your body needs, you should lose weight. A healthy amount of weight to lose per week is usually 1-2 lb (0.5-0.9 kg). This usually means reducing your daily calorie intake by 500-750 calories. The number of calories in a food can be found on a Nutrition Facts label. If a food does not have a Nutrition Facts label, try to look up the calories online or ask your dietitian for help. Use smaller plates, glasses, and bowls for smaller portions and to prevent overeating. Use your calories on foods and drinks that will fill you up and not leave you hungry shortly after a meal. This information is not intended to replace advice given to you by your health care provider. Make sure you discuss any questions you have with your health care provider. Document Revised: 05/15/2019 Document Reviewed: 05/15/2019 Elsevier Patient Education  Central. Preventing Hypertension Hypertension, also called high blood pressure, is when the force of blood pumping through the arteries is too strong. Arteries are blood  vessels that carry blood from the heart throughout the body. Often, hypertension does not cause symptoms until blood pressure is very high. It is important to have your blood pressure checked regularly. Diet and lifestyle changes can help you prevent hypertension, and they may make you feel better overall and improve your quality of life. If you already have hypertension, you may control it with diet and lifestyle changes,  as well as with medicine. How can this condition affect me? Over time, hypertension can damage the arteries and decrease blood flow to important parts of the body, including the brain, heart, and kidneys. By keeping your blood pressure in a healthy range, you can help prevent complications like heart attack, heart failure, stroke, kidney failure, and vascular dementia. What can increase my risk? An unhealthy diet and a lack of physical activity can make you more likely to develop high blood pressure. Some other risk factors include: Age. The risk increases with age. Having family members who have had high blood pressure. Having certain health conditions, such as thyroid problems. Being overweight or obese. Drinking too much alcohol or caffeine. Having too much fat, sugar, calories, or salt (sodium) in your diet. Smoking or using illegal drugs. Taking certain medicines, such as antidepressants, decongestants, birth control pills, and NSAIDs, such as ibuprofen. What actions can I take to prevent or manage this condition? Work with your health care provider to make a hypertension prevention plan that works for you. You may be referred for counseling on a healthy diet and physical activity. Follow your plan and keep all follow-up visits. Diet changes Maintain a healthy diet. This includes: Eating less salt (sodium). Ask your health care provider how much sodium is safe for you to have. The general recommendation is to have less than 1 tsp (2,300 mg) of sodium a day. Do not add salt  to your food. Choose low-sodium options when grocery shopping and eating out. Limiting fats in your diet. You can do this by eating low-fat or fat-free dairy products and by eating less red meat. Eating more fruits, vegetables, and whole grains. Make a goal to eat: 1-2 cups of fresh fruits and vegetables each day. 3-4 servings of whole grains each day. Avoiding foods and beverages that have added sugars. Eating fish that contain healthy fats (omega-3 fatty acids), such as mackerel or salmon. If you need help putting together a healthy eating plan, try the DASH diet. This diet is high in fruits, vegetables, and whole grains. It is low in sodium, red meat, and added sugars. DASH stands for Dietary Approaches to Stop Hypertension. Lifestyle changes  Lose weight if you are overweight. Losing just 3-5% of your body weight can help prevent or control hypertension. For example, if your present weight is 200 lb (91 kg), a loss of 3-5% of your weight means losing 6-10 lb (2.7-4.5 kg). Ask your health care provider to help you with a diet and exercise plan to safely lose weight. Get enough exercise. Do at least 150 minutes of moderate-intensity exercise each week. You could do this in short exercise sessions several times a day, or you could do longer exercise sessions a few times a week. For example, you could take a brisk 10-minute walk or bike ride, 3 times a day, for 5 days a week. Find ways to reduce stress, such as exercising, meditating, listening to music, or taking a yoga class. If you need help reducing stress, ask your health care provider. Do not use any products that contain nicotine or tobacco. These products include cigarettes, chewing tobacco, and vaping devices, such as e-cigarettes. Chemicals in tobacco and nicotine products raise your blood pressure each time you use them. If you need help quitting, ask your health care provider. Learn how to check your blood pressure at home. Make sure that  you know your personal target blood pressure, as told by your health care provider. Try to  sleep 7-9 hours per night. Alcohol use Do not drink alcohol if: Your health care provider tells you not to drink. You are pregnant, may be pregnant, or are planning to become pregnant. If you drink alcohol: Limit how much you have to: 0-1 drink a day for women. 0-2 drinks a day for men. Know how much alcohol is in your drink. In the [place] one drink equals one 12 oz bottle of beer (355 mL), one 5 oz glass of wine (148 mL), or one 1 oz glass of hard liquor (44 mL). Medicines In addition to diet and lifestyle changes, your health care provider may recommend medicines to help lower your blood pressure. In general: You may need to try a few different medicines to find what works best for you. You may need to take more than one medicine. Take over-the-counter and prescription medicines only as told by your health care provider. Questions to ask your health care provider What is my blood pressure goal? How can I lower my risk for high blood pressure? How should I monitor my blood pressure at home? Where to find support Your health care provider can help you prevent hypertension and help you keep your blood pressure at a healthy level. Your local hospital or your community may also provide support services and prevention programs. The American Heart Association offers an online support network at supportnetwork.heart.org Where to find more information Learn more about hypertension from: National Heart, Lung, and Blood Institute: PopSteam.is Centers for Disease Control and Prevention: FootballExhibition.com.br American Academy of Family Physicians: familydoctor.org Learn more about the DASH diet from: National Heart, Lung, and Blood Institute: PopSteam.is Contact a health care provider if: You think you are having a reaction to medicines you have taken. You have recurrent headaches or feel dizzy. You  have swelling in your ankles. You have trouble with your vision. Get help right away if: You have sudden, severe chest, back, or abdominal pain or discomfort. You have shortness of breath. You have a sudden, severe headache. These symptoms may be an emergency. Get help right away. Call 911. Do not wait to see if the symptoms will go away. Do not drive yourself to the hospital. Summary Hypertension often does not cause any symptoms until blood pressure is very high. It is important to get your blood pressure checked regularly. Diet and lifestyle changes are important steps in preventing hypertension. By keeping your blood pressure in a healthy range, you may prevent complications like heart attack, heart failure, stroke, and kidney failure. Work with your health care provider to make a hypertension prevention plan that works for you. This information is not intended to replace advice given to you by your health care provider. Make sure you discuss any questions you have with your health care provider. Document Revised: 01/20/2021 Document Reviewed: 01/20/2021 Elsevier Patient Education  2023 Elsevier Inc. Discussed eating small frequent meal, reduction in acidic foods, fried foods ,spicy foods, alcohol caffeine and tobacco and certain medications. Avoid laying down after eating 5mins-1hour, elevated head of the bed.

## 2022-03-23 NOTE — Telephone Encounter (Signed)
Will forward to provider  

## 2022-03-23 NOTE — Progress Notes (Signed)
Shawn Raymond, is a 41 y.o. male  IBB:[id number]  IHW:[id number]  DOB - June 04, 1980  Chief Complaint  Patient presents with   Medication Refill   Hypertension       Subjective:   [name] is a 41 y.o. obese male here today for a follow up visit for asthma and requesting medication refills. Patient has No headache, No chest pain, No abdominal pain - No Nausea, No new weakness tingling or numbness, No Cough - shortness of breath  No problems updated.  Allergies  Allergen Reactions   Iodine Anaphylaxis   Shellfish Allergy Anaphylaxis    Past Medical History:  Diagnosis Date   Asthma    Sarcoidosis     No current outpatient medications on file prior to visit.   No current facility-administered medications on file prior to visit.    Objective:   Vitals:   03/23/22 1350 03/23/22 1414  BP: (Abnormal) 144/83 124/82  Pulse: 64   SpO2: 99%   Weight: 189 lb 9.6 oz (86 kg)   Height: _0  (1.676 m)     Exam General appearance : Awake, alert, not in any distress. Speech Clear. Not toxic looking HEENT: Atraumatic and Normocephalic, pupils equally reactive to light and accomodation Neck: Supple, no JVD. No cervical lymphadenopathy.  Chest: Good air entry bilaterally, wheezing all 4 quadrants CVS: S1 S2 regular, no murmurs.  Abdomen: Bowel sounds present, Non tender and not distended with no gaurding, rigidity or rebound. Extremities: B/L Lower Ext shows no edema, both legs are warm to touch Neurology: Awake alert, and oriented X 3,  Non focal Skin: No Rash  Data Review No results found for: "HGBA1C"  Assessment & Plan   1. Cervical radiculopathy - methocarbamol (ROBAXIN) 500 MG tablet; TAKE 1 TABLET (500 MG TOTAL) BY MOUTH 3 (THREE) TIMES DAILY.  Dispense: 60 tablet; Refill: 2  2. Refill clinic medication management patient - methocarbamol (ROBAXIN) 500 MG tablet; TAKE 1 TABLET (500 MG TOTAL) BY MOUTH 3 (THREE) TIMES  DAILY.  Dispense: 60 tablet; Refill: 2 - omeprazole (PRILOSEC) 40 MG capsule; TAKE 1 CAPSULE (40 MG TOTAL) BY MOUTH DAILY.  Dispense: 30 capsule; Refill: 0  3. Heartburn Discussed eating small frequent meal, reduction in acidic foods, fried foods ,spicy foods, alcohol caffeine and tobacco and certain medications. Avoid laying down after eating 53mns-1hour, elevated head of the bed.  - omeprazole (PRILOSEC) 40 MG capsule; TAKE 1 CAPSULE (40 MG TOTAL) BY MOUTH DAILY.  Dispense: 30 capsule; Refill: 0  4. Moderate persistent asthma without complication - albuterol (VENTOLIN HFA) 108 (90 Base) MCG/ACT inhaler; Inhale 2 puffs into the lungs every 6 (six) hours as needed for wheezing or shortness of breath.  Dispense: 18 g; Refill: 0  5. Essential Hypertension 2/2 Chronic bilateral low back pain, unspecified whether sciatica present . Not on medication- Patient will change lifestyle modification. BP goal - < 130/80 Explained that having normal blood pressure is the goal and medications are helping to get to goal and maintain normal blood pressure. DIET: Limit salt intake, read nutrition labels to check salt content, limit fried and high fatty foods  Avoid using multisymptom OTC cold preparations that generally contain sudafed which can rise BP. Consult with pharmacist on best cold relief products to use for persons with HTN EXERCISE Discussed incorporating exercise such as walking - 30 minutes most days of the week and can do in 10 minute intervals    - CMP14+EGFR - Lipid Panel - CBC  with Differential  Chronic bilateral low back pain, unspecified whether sciatica present  L1- S1 referring to pain clinic   Class 1 obesity due to excess calories without serious comorbidity with body mass index (BMI) of 30.0 to 30.9 in adult  Obesity is 30-39 indicating an excess in caloric intake or underlining conditions. This may lead to other co-morbidities. Educated on lifestyle modifications of diet and  exercise which may reduce obesity.   Patient have been counseled extensively about nutrition and exercise. Other issues discussed during this visit include: low cholesterol diet, weight control and daily exercise, foot care, annual eye examinations at Ophthalmology, importance of adherence with medications and regular follow-up. We also discussed long term complications of uncontrolled diabetes and hypertension.   HCV Ab w Reflex to Quant PCR Future, Expires: 03/24/2023   Return in about 3 months (around 06/22/2022) for asthma/ Bp ck.  The patient was given clear instructions to go to ER or return to medical center if symptoms don't improve, worsen or new problems develop. The patient verbalized understanding. The patient was told to call to get lab results if they haven't heard anything in the next week.   This note has been created with Surveyor, quantity. Any transcriptional errors are unintentional.   Kerin Perna, NP 03/23/2022, 2:31 PM

## 2022-03-24 LAB — LIPID PANEL
Chol/HDL Ratio: 5.1 ratio — ABNORMAL HIGH (ref 0.0–5.0)
Cholesterol, Total: 210 mg/dL — ABNORMAL HIGH (ref 100–199)
HDL: 41 mg/dL (ref >39)
LDL Chol Calc (NIH): 134 mg/dL — ABNORMAL HIGH (ref 0–99)
Triglycerides: 195 mg/dL — ABNORMAL HIGH (ref 0–149)
VLDL Cholesterol Cal: 35 mg/dL (ref 5–40)

## 2022-03-24 LAB — CBC WITH DIFFERENTIAL/PLATELET
Basophils Absolute: 0.1 10*3/uL (ref 0.0–0.2)
Basos: 1 %
EOS (ABSOLUTE): 0.1 10*3/uL (ref 0.0–0.4)
Eos: 1 %
Hematocrit: 43.2 % (ref 37.5–51.0)
Hemoglobin: 14.4 g/dL (ref 13.0–17.7)
Immature Grans (Abs): 0 10*3/uL (ref 0.0–0.1)
Immature Granulocytes: 0 %
Lymphocytes Absolute: 1.8 10*3/uL (ref 0.7–3.1)
Lymphs: 17 %
MCH: 29.8 pg (ref 26.6–33.0)
MCHC: 33.3 g/dL (ref 31.5–35.7)
MCV: 89 fL (ref 79–97)
Monocytes Absolute: 0.5 10*3/uL (ref 0.1–0.9)
Monocytes: 5 %
Neutrophils Absolute: 7.9 10*3/uL — ABNORMAL HIGH (ref 1.4–7.0)
Neutrophils: 76 %
Platelets: 240 10*3/uL (ref 150–450)
RBC: 4.84 x10E6/uL (ref 4.14–5.80)
RDW: 13.5 % (ref 11.6–15.4)
WBC: 10.4 10*3/uL (ref 3.4–10.8)

## 2022-03-24 LAB — CMP14+EGFR
ALT: 23 IU/L (ref 0–44)
AST: 16 IU/L (ref 0–40)
Albumin/Globulin Ratio: 1.4 (ref 1.2–2.2)
Albumin: 4.3 g/dL (ref 4.1–5.1)
Alkaline Phosphatase: 107 IU/L (ref 44–121)
BUN/Creatinine Ratio: 11 (ref 9–20)
BUN: 14 mg/dL (ref 6–24)
Bilirubin Total: 0.4 mg/dL (ref 0.0–1.2)
CO2: 23 mmol/L (ref 20–29)
Calcium: 9.2 mg/dL (ref 8.7–10.2)
Chloride: 102 mmol/L (ref 96–106)
Creatinine, Ser: 1.29 mg/dL — ABNORMAL HIGH (ref 0.76–1.27)
Globulin, Total: 3 g/dL (ref 1.5–4.5)
Glucose: 83 mg/dL (ref 70–99)
Potassium: 4.1 mmol/L (ref 3.5–5.2)
Sodium: 140 mmol/L (ref 134–144)
Total Protein: 7.3 g/dL (ref 6.0–8.5)
eGFR: 71 mL/min/{1.73_m2} (ref >59)

## 2022-04-07 ENCOUNTER — Other Ambulatory Visit: Payer: Self-pay

## 2022-04-07 ENCOUNTER — Other Ambulatory Visit (INDEPENDENT_AMBULATORY_CARE_PROVIDER_SITE_OTHER): Payer: Self-pay | Admitting: Primary Care

## 2022-04-07 DIAGNOSIS — J4540 Moderate persistent asthma, uncomplicated: Secondary | ICD-10-CM

## 2022-04-07 DIAGNOSIS — Z760 Encounter for issue of repeat prescription: Secondary | ICD-10-CM

## 2022-04-07 DIAGNOSIS — R12 Heartburn: Secondary | ICD-10-CM

## 2022-04-07 MED ORDER — OMEPRAZOLE 40 MG PO CPDR
40.0000 mg | DELAYED_RELEASE_CAPSULE | Freq: Every day | ORAL | 0 refills | Status: DC
  Filled 2022-04-07 – 2022-04-17 (×2): qty 90, 90d supply, fill #0

## 2022-04-07 MED ORDER — ALBUTEROL SULFATE HFA 108 (90 BASE) MCG/ACT IN AERS
2.0000 | INHALATION_SPRAY | Freq: Four times a day (QID) | RESPIRATORY_TRACT | 0 refills | Status: DC | PRN
  Filled 2022-04-07 – 2022-04-17 (×2): qty 18, 25d supply, fill #0

## 2022-04-07 NOTE — Telephone Encounter (Signed)
Requested Prescriptions  Pending Prescriptions Disp Refills   omeprazole (PRILOSEC) 40 MG capsule 90 capsule 0    Sig: TAKE 1 CAPSULE (40 MG TOTAL) BY MOUTH DAILY.     Gastroenterology: Proton Pump Inhibitors Passed - 04/07/2022 10:04 AM      Passed - Valid encounter within last 12 months    Recent Outpatient Visits           2 weeks ago Essential hypertension   [facility] Grayce Sessions, NP   1 year ago Spinal stenosis in cervical region   [facility] Grayce Sessions, NP   1 year ago Umbilical hernia with obstruction, without gangrene   [facility] Grayce Sessions, NP   2 years ago Acute bilateral low back pain, unspecified whether sciatica present   [facility] Grayce Sessions, NP   2 years ago Acute bilateral low back pain, unspecified whether sciatica present   [facility] Grayce Sessions, NP       Future Appointments             In 1 month Boscia, Kathlynn Grate, NP [facility]   In 2 months Randa Evens, Kinnie Scales, NP [facility]             albuterol (VENTOLIN HFA) 108 (90 Base) MCG/ACT inhaler 18 g 0    Sig: Inhale 2 puffs into the lungs every 6 (six) hours as needed for wheezing or shortness of breath.     Pulmonology:  Beta Agonists 2 Passed - 04/07/2022 10:04 AM      Passed - Last BP in normal range    BP Readings from Last 1 Encounters:  03/23/22 124/82         Passed - Last Heart Rate in normal range    Pulse Readings from Last 1 Encounters:  03/23/22 64         Passed - Valid encounter within last 12 months    Recent Outpatient Visits           2 weeks ago Essential hypertension   [facility] Grayce Sessions, NP   1 year ago Spinal stenosis in cervical region   [facility] Grayce Sessions, NP   1 year ago Umbilical  hernia with obstruction, without gangrene   [facility] Grayce Sessions, NP   2 years ago Acute bilateral low back pain, unspecified whether sciatica present   [facility] Grayce Sessions, NP   2 years ago Acute bilateral low back pain, unspecified whether sciatica present   [facility] Grayce Sessions, NP       Future Appointments             In 1 month Boscia, Kathlynn Grate, NP [facility]   In 2 months Randa Evens, Kinnie Scales, NP [facility]

## 2022-04-13 ENCOUNTER — Other Ambulatory Visit: Payer: Self-pay

## 2022-04-17 ENCOUNTER — Other Ambulatory Visit: Payer: Self-pay

## 2022-04-18 ENCOUNTER — Other Ambulatory Visit: Payer: Self-pay

## 2022-04-19 ENCOUNTER — Other Ambulatory Visit: Payer: Self-pay

## 2022-05-03 ENCOUNTER — Ambulatory Visit: Payer: Medicaid Other | Attending: Physical Medicine & Rehabilitation

## 2022-05-03 ENCOUNTER — Other Ambulatory Visit: Payer: Self-pay

## 2022-05-03 DIAGNOSIS — Z981 Arthrodesis status: Secondary | ICD-10-CM | POA: Diagnosis not present

## 2022-05-03 DIAGNOSIS — M5459 Other low back pain: Secondary | ICD-10-CM | POA: Diagnosis present

## 2022-05-03 DIAGNOSIS — M6281 Muscle weakness (generalized): Secondary | ICD-10-CM | POA: Insufficient documentation
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
## 2022-05-08 ENCOUNTER — Ambulatory Visit (INDEPENDENT_AMBULATORY_CARE_PROVIDER_SITE_OTHER): Payer: Medicaid Other | Admitting: Primary Care

## 2022-05-10 ENCOUNTER — Ambulatory Visit: Payer: Medicaid Other

## 2022-05-10 DIAGNOSIS — Z981 Arthrodesis status: Secondary | ICD-10-CM | POA: Diagnosis not present

## 2022-05-10 DIAGNOSIS — M6281 Muscle weakness (generalized): Secondary | ICD-10-CM

## 2022-05-10 DIAGNOSIS — M5459 Other low back pain: Secondary | ICD-10-CM
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
## 2022-05-12 ENCOUNTER — Other Ambulatory Visit (INDEPENDENT_AMBULATORY_CARE_PROVIDER_SITE_OTHER): Payer: Self-pay | Admitting: Primary Care

## 2022-05-12 ENCOUNTER — Other Ambulatory Visit: Payer: Self-pay

## 2022-05-12 DIAGNOSIS — Z760 Encounter for issue of repeat prescription: Secondary | ICD-10-CM

## 2022-05-12 DIAGNOSIS — R12 Heartburn: Secondary | ICD-10-CM

## 2022-05-12 DIAGNOSIS — J4540 Moderate persistent asthma, uncomplicated: Secondary | ICD-10-CM

## 2022-05-12 MED ORDER — ALBUTEROL SULFATE HFA 108 (90 BASE) MCG/ACT IN AERS
2.0000 | INHALATION_SPRAY | Freq: Four times a day (QID) | RESPIRATORY_TRACT | 0 refills | Status: DC | PRN
  Filled 2022-05-12: qty 18, 25d supply, fill #0

## 2022-05-12 MED ORDER — OMEPRAZOLE 40 MG PO CPDR
40.0000 mg | DELAYED_RELEASE_CAPSULE | Freq: Every day | ORAL | 0 refills | Status: DC
  Filled 2022-05-12 – 2022-07-24 (×4): qty 90, 90d supply, fill #0

## 2022-05-12 NOTE — Telephone Encounter (Signed)
Requested Prescriptions  Pending Prescriptions Disp Refills   omeprazole (PRILOSEC) 40 MG capsule 90 capsule 0    Sig: Take 1 capsule (40 mg total) by mouth daily.     Gastroenterology: Proton Pump Inhibitors Passed - 05/12/2022  8:04 AM      Passed - Valid encounter within last 12 months    Recent Outpatient Visits           1 month ago Essential hypertension   [place], Michelle P, NP   1 year ago Spinal stenosis in cervical region   [place] [facility] Kerin Perna, NP   1 year ago Umbilical hernia with obstruction, without gangrene   [place] [facility] Kerin Perna, NP   2 years ago Acute bilateral low back pain, unspecified whether sciatica present   [place], Michelle P, NP   2 years ago Acute bilateral low back pain, unspecified whether sciatica present   [place], [place], NP       Future Appointments             In 5 days Ronnell Freshwater, NP [facility]   In 1 month Oletta Lamas, Milford Cage, NP [place] [facility]             albuterol (VENTOLIN HFA) 108 (90 Base) MCG/ACT inhaler 18 g 0    Sig: Inhale 2 puffs into the lungs every 6 (six) hours as needed for wheezing or shortness of breath.     Pulmonology:  Beta Agonists 2 Passed - 05/12/2022  8:04 AM      Passed - Last BP in normal range    BP Readings from Last 1 Encounters:  03/23/22 124/82         Passed - Last Heart Rate in normal range    Pulse Readings from Last 1 Encounters:  03/23/22 64         Passed - Valid encounter within last 12 months    Recent Outpatient Visits           1 month ago Essential hypertension   [place] Kerin Perna, NP   1 year ago Spinal stenosis in cervical region   [place] [facility] Kerin Perna, NP   1 year ago Umbilical hernia with obstruction, without gangrene   [place] [facility] Kerin Perna, NP   2 years ago Acute bilateral low back pain, unspecified whether sciatica present   [place], Michelle P, NP   2 years ago Acute bilateral low back pain, unspecified whether sciatica present   [place], [place], NP       Future Appointments             In 5 days Ronnell Freshwater, NP [facility]   In 1 month Oletta Lamas, Milford Cage, NP [place]

## 2022-05-16 ENCOUNTER — Ambulatory Visit: Payer: Medicaid Other

## 2022-05-16 DIAGNOSIS — M6281 Muscle weakness (generalized): Secondary | ICD-10-CM

## 2022-05-16 DIAGNOSIS — M5459 Other low back pain: Secondary | ICD-10-CM

## 2022-05-16 DIAGNOSIS — Z981 Arthrodesis status: Secondary | ICD-10-CM | POA: Diagnosis not present
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
## 2022-05-17 ENCOUNTER — Ambulatory Visit: Payer: Medicaid Other | Admitting: Nurse Practitioner

## 2022-05-18 ENCOUNTER — Telehealth: Payer: Self-pay

## 2022-05-18 ENCOUNTER — Ambulatory Visit: Payer: Medicaid Other | Attending: Physical Medicine & Rehabilitation

## 2022-05-18 DIAGNOSIS — Z981 Arthrodesis status: Secondary | ICD-10-CM | POA: Insufficient documentation

## 2022-05-18 DIAGNOSIS — M5459 Other low back pain: Secondary | ICD-10-CM | POA: Insufficient documentation

## 2022-05-18 DIAGNOSIS — M6281 Muscle weakness (generalized): Secondary | ICD-10-CM | POA: Insufficient documentation

## 2022-05-18 NOTE — Telephone Encounter (Signed)
TC due to missed visit.  VM informing patient of next scheduled appointment as well as additional times available today.

## 2022-05-23 ENCOUNTER — Ambulatory Visit: Payer: Medicaid Other

## 2022-05-23 DIAGNOSIS — M5459 Other low back pain: Secondary | ICD-10-CM | POA: Diagnosis present

## 2022-05-23 DIAGNOSIS — Z981 Arthrodesis status: Secondary | ICD-10-CM

## 2022-05-23 DIAGNOSIS — M6281 Muscle weakness (generalized): Secondary | ICD-10-CM | POA: Diagnosis present
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
## 2022-05-24 ENCOUNTER — Other Ambulatory Visit (INDEPENDENT_AMBULATORY_CARE_PROVIDER_SITE_OTHER): Payer: Self-pay | Admitting: Primary Care

## 2022-05-24 ENCOUNTER — Other Ambulatory Visit: Payer: Self-pay

## 2022-05-24 DIAGNOSIS — J4540 Moderate persistent asthma, uncomplicated: Secondary | ICD-10-CM

## 2022-05-25 ENCOUNTER — Telehealth: Payer: Self-pay

## 2022-05-25 ENCOUNTER — Ambulatory Visit: Payer: Medicaid Other

## 2022-05-25 NOTE — Telephone Encounter (Signed)
Unable to refill per protocol, Rx request is too soon.  Requested Prescriptions  Pending Prescriptions Disp Refills   albuterol (VENTOLIN HFA) 108 (90 Base) MCG/ACT inhaler 18 g 0    Sig: Inhale 2 puffs into the lungs every 6 (six) hours as needed for wheezing or shortness of breath.     Pulmonology:  Beta Agonists 2 Passed - 05/24/2022  4:04 PM      Passed - Last BP in normal range    BP Readings from Last 1 Encounters:  03/23/22 124/82         Passed - Last Heart Rate in normal range    Pulse Readings from Last 1 Encounters:  03/23/22 64         Passed - Valid encounter within last 12 months    Recent Outpatient Visits           2 months ago Essential hypertension   [place] Kerin Perna, NP   1 year ago Spinal stenosis in cervical region   [place], Michelle P, NP   1 year ago Umbilical hernia with obstruction, without gangrene   [place] [facility] Kerin Perna, NP   2 years ago Acute bilateral low back pain, unspecified whether sciatica present   [place], Michelle P, NP   2 years ago Acute bilateral low back pain, unspecified whether sciatica present   [place], [place], NP       Future Appointments             In 4 weeks Oletta Lamas, Milford Cage, NP [place]   In 2 months Junius Creamer, Greer Ee, NP [place] [facility]

## 2022-05-25 NOTE — Telephone Encounter (Signed)
TC due to missed visit, VM left informing patient of second no show and reminded of attendance policy as well as next scheduled visit.

## 2022-05-29 ENCOUNTER — Other Ambulatory Visit: Payer: Self-pay

## 2022-05-30 ENCOUNTER — Ambulatory Visit: Payer: Medicaid Other

## 2022-05-31 ENCOUNTER — Other Ambulatory Visit: Payer: Self-pay | Admitting: Physical Medicine & Rehabilitation

## 2022-05-31 DIAGNOSIS — M5414 Radiculopathy, thoracic region: Secondary | ICD-10-CM

## 2022-05-31 DIAGNOSIS — M5412 Radiculopathy, cervical region: Secondary | ICD-10-CM

## 2022-06-01 ENCOUNTER — Ambulatory Visit: Payer: Medicaid Other
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
## 2022-06-06 ENCOUNTER — Other Ambulatory Visit: Payer: Self-pay

## 2022-06-06 ENCOUNTER — Other Ambulatory Visit (INDEPENDENT_AMBULATORY_CARE_PROVIDER_SITE_OTHER): Payer: Self-pay | Admitting: Primary Care

## 2022-06-06 ENCOUNTER — Ambulatory Visit: Payer: Medicaid Other

## 2022-06-06 DIAGNOSIS — M5459 Other low back pain: Secondary | ICD-10-CM

## 2022-06-06 DIAGNOSIS — M6281 Muscle weakness (generalized): Secondary | ICD-10-CM

## 2022-06-06 DIAGNOSIS — J4540 Moderate persistent asthma, uncomplicated: Secondary | ICD-10-CM

## 2022-06-06 DIAGNOSIS — Z981 Arthrodesis status: Secondary | ICD-10-CM | POA: Diagnosis not present

## 2022-06-06 MED ORDER — ALBUTEROL SULFATE HFA 108 (90 BASE) MCG/ACT IN AERS
2.0000 | INHALATION_SPRAY | Freq: Four times a day (QID) | RESPIRATORY_TRACT | 0 refills | Status: DC | PRN
  Filled 2022-06-06: qty 18, 25d supply, fill #0
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
## 2022-06-08 ENCOUNTER — Ambulatory Visit: Payer: Medicaid Other

## 2022-06-08 ENCOUNTER — Telehealth: Payer: Self-pay

## 2022-06-08 NOTE — Telephone Encounter (Signed)
TC due to missed visit.  Spoke directly with patient, he forgot about appointment as he is dealing with a family event/crisis.  Reminded patient of next scheduled appointment an need to call  ahead if cannot attend.

## 2022-06-13 ENCOUNTER — Ambulatory Visit
Admission: RE | Admit: 2022-06-13 | Discharge: 2022-06-13 | Disposition: A | Discharge status: Home / Self Care | Payer: Medicaid Other | Source: Ambulatory Visit | Attending: Physical Medicine & Rehabilitation | Admitting: Physical Medicine & Rehabilitation

## 2022-06-13 ENCOUNTER — Ambulatory Visit: Payer: Medicaid Other

## 2022-06-13 DIAGNOSIS — M5459 Other low back pain: Secondary | ICD-10-CM

## 2022-06-13 DIAGNOSIS — M6281 Muscle weakness (generalized): Secondary | ICD-10-CM

## 2022-06-13 DIAGNOSIS — M5412 Radiculopathy, cervical region: Secondary | ICD-10-CM

## 2022-06-13 DIAGNOSIS — M5414 Radiculopathy, thoracic region: Secondary | ICD-10-CM

## 2022-06-13 DIAGNOSIS — Z981 Arthrodesis status: Secondary | ICD-10-CM | POA: Diagnosis not present
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
## 2022-06-15 ENCOUNTER — Ambulatory Visit: Payer: Medicaid Other

## 2022-06-22 ENCOUNTER — Ambulatory Visit (INDEPENDENT_AMBULATORY_CARE_PROVIDER_SITE_OTHER): Payer: Medicaid Other | Admitting: Primary Care

## 2022-06-28 ENCOUNTER — Other Ambulatory Visit: Payer: Self-pay

## 2022-06-28 ENCOUNTER — Other Ambulatory Visit (INDEPENDENT_AMBULATORY_CARE_PROVIDER_SITE_OTHER): Payer: Self-pay | Admitting: Primary Care

## 2022-06-28 DIAGNOSIS — J4540 Moderate persistent asthma, uncomplicated: Secondary | ICD-10-CM

## 2022-06-28 MED ORDER — ALBUTEROL SULFATE HFA 108 (90 BASE) MCG/ACT IN AERS
2.0000 | INHALATION_SPRAY | Freq: Four times a day (QID) | RESPIRATORY_TRACT | 0 refills | Status: DC | PRN
  Filled 2022-06-28: qty 18, 25d supply, fill #0

## 2022-06-29 ENCOUNTER — Ambulatory Visit: Payer: Medicaid Other | Attending: Physical Medicine & Rehabilitation

## 2022-06-29 DIAGNOSIS — M5459 Other low back pain: Secondary | ICD-10-CM

## 2022-06-29 DIAGNOSIS — Z981 Arthrodesis status: Secondary | ICD-10-CM | POA: Diagnosis not present

## 2022-06-29 DIAGNOSIS — M6281 Muscle weakness (generalized): Secondary | ICD-10-CM | POA: Diagnosis present
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
## 2022-07-16 ENCOUNTER — Other Ambulatory Visit (INDEPENDENT_AMBULATORY_CARE_PROVIDER_SITE_OTHER): Payer: Self-pay | Admitting: Primary Care

## 2022-07-16 DIAGNOSIS — J4540 Moderate persistent asthma, uncomplicated: Secondary | ICD-10-CM

## 2022-07-17 ENCOUNTER — Other Ambulatory Visit: Payer: Self-pay

## 2022-07-17 ENCOUNTER — Ambulatory Visit: Payer: Self-pay

## 2022-07-17 ENCOUNTER — Ambulatory Visit: Payer: Medicaid Other | Attending: Physical Medicine & Rehabilitation

## 2022-07-17 DIAGNOSIS — Z981 Arthrodesis status: Secondary | ICD-10-CM | POA: Diagnosis present

## 2022-07-17 DIAGNOSIS — M5459 Other low back pain: Secondary | ICD-10-CM | POA: Insufficient documentation

## 2022-07-17 DIAGNOSIS — M6281 Muscle weakness (generalized): Secondary | ICD-10-CM | POA: Diagnosis present

## 2022-07-17 MED ORDER — ALBUTEROL SULFATE HFA 108 (90 BASE) MCG/ACT IN AERS
2.0000 | INHALATION_SPRAY | Freq: Four times a day (QID) | RESPIRATORY_TRACT | 0 refills | Status: DC | PRN
  Filled 2022-07-17 – 2022-07-23 (×2): qty 18, 25d supply, fill #0
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
## 2022-07-19 ENCOUNTER — Other Ambulatory Visit: Payer: Self-pay

## 2022-07-20 ENCOUNTER — Other Ambulatory Visit: Payer: Self-pay

## 2022-07-20 ENCOUNTER — Encounter: Payer: Self-pay | Admitting: Pharmacist

## 2022-07-21 ENCOUNTER — Other Ambulatory Visit: Payer: Self-pay

## 2022-07-24 ENCOUNTER — Other Ambulatory Visit: Payer: Self-pay

## 2022-07-28 ENCOUNTER — Encounter: Payer: Self-pay | Admitting: Nurse Practitioner

## 2022-07-28 ENCOUNTER — Ambulatory Visit (INDEPENDENT_AMBULATORY_CARE_PROVIDER_SITE_OTHER): Payer: Medicaid Other | Admitting: Nurse Practitioner

## 2022-07-28 VITALS — BP 141/83 | HR 67 | Ht 66.0 in | Wt 193.1 lb

## 2022-07-28 DIAGNOSIS — Z6831 Body mass index (BMI) 31.0-31.9, adult: Secondary | ICD-10-CM

## 2022-07-28 DIAGNOSIS — Z7689 Persons encountering health services in other specified circumstances: Secondary | ICD-10-CM

## 2022-07-28 DIAGNOSIS — E6609 Other obesity due to excess calories: Secondary | ICD-10-CM | POA: Diagnosis not present

## 2022-07-28 DIAGNOSIS — J4540 Moderate persistent asthma, uncomplicated: Secondary | ICD-10-CM

## 2022-07-28 MED ORDER — AIRSUPRA 90-80 MCG/ACT IN AERO: 2.0000 | INHALATION_SPRAY | Freq: Four times a day (QID) | RESPIRATORY_TRACT | 3 refills | Status: DC | PRN

## 2022-07-28 NOTE — Progress Notes (Signed)
New Patient Office Visit  Subjective    Patient ID: Shawn Raymond, male    DOB: 1981/02/02  Age: 42 y.o. MRN: [id number]  CC:  Chief Complaint  Patient presents with   New Patient (Initial Visit)    HPI Shawn Raymond presents to establish care History of asthma  Using rescue inhaler multiple times per day --using resccue inhaler currently not working for very long Currently no concerns or complaints.   Outpatient Encounter Medications as of 07/28/2022  Medication Sig   Albuterol-Budesonide (AIRSUPRA) 90-80 MCG/ACT AERO Inhale 2 Inhalations into the lungs 4 (four) times daily as needed.   budesonide-formoterol (SYMBICORT) 160-4.5 MCG/ACT inhaler Inhale 2 puffs into the lungs 2 (two) times daily.   methocarbamol (ROBAXIN) 500 MG tablet TAKE 1 TABLET (500 MG TOTAL) BY MOUTH 3 (THREE) TIMES DAILY.   omeprazole (PRILOSEC) 40 MG capsule Take 1 capsule (40 mg total) by mouth daily.   oxybutynin (DITROPAN-XL) 10 MG 24 hr tablet Take 1 tablet (10 mg total) by mouth daily as directed.   [DISCONTINUED] albuterol (VENTOLIN HFA) 108 (90 Base) MCG/ACT inhaler Inhale 2 puffs into the lungs every 6 (six) hours as needed for wheezing or shortness of breath.   No facility-administered encounter medications on file as of 07/28/2022.    Past Medical History:  Diagnosis Date   Asthma    Sarcoidosis     Past Surgical History:  Procedure Laterality Date   ANTERIOR CERVICAL DECOMP/DISCECTOMY FUSION N/A 04/02/2018   Procedure: ANTERIOR CERVICAL DECOMPRESSION/DISCECTOMY FUSION CERVICAL SIX- CERVICAL SEVEN ;  Surgeon: Lisbeth Renshaw, MD;  Location: [facility];  Service: Neurosurgery;  Laterality: N/A;   FEMUR FRACTURE SURGERY     PATELLA RECONSTRUCTION     POSTERIOR CERVICAL FUSION/FORAMINOTOMY N/A 04/02/2018   Procedure: CERVICAL THREE- CERVICAL FOUR, CERVICAL FOUR- CERVICAL FIVE, CERVICAL FIVE- CERVICAL SIX, CERVICAL SIX- CERVICAL SEVEN LAMINECTOMY WITH LATERAL MASS POSTERIOR SEGMENTAL  INSTRUEMNTATION, POSTERIOR LATERAL ARTHRODESIS;  Surgeon: Lisbeth Renshaw, MD;  Location: [facility];  Service: Neurosurgery;  Laterality: N/A;    Family History  Problem Relation Age of Onset   Healthy Mother    Healthy Father     Social History   Socioeconomic History   Marital status: Divorced    Spouse name: Not on file   Number of children: Not on file   Years of education: Not on file   Highest education level: Not on file  Occupational History   Not on file  Tobacco Use   Smoking status: Former    Packs/day: .7    Types: Cigarettes    Quit date: 12/24/2017    Years since quitting: 4.6   Smokeless tobacco: Never   Tobacco comments:    patient has been  without a cigarette since 12/27/17  Vaping Use   Vaping Use: Never used  Substance and Sexual Activity   Alcohol use: No   Drug use: Yes    Types: Marijuana    Comment: 3grams a day.    Sexual activity: Not on file  Other Topics Concern   Not on file  Social History Narrative   Not on file   Social Determinants of Health   Financial Resource Strain: Not on file  Food Insecurity: Not on file  Transportation Needs: Not on file  Physical Activity: Not on file  Stress: Not on file  Social Connections: Not on file  Intimate Partner Violence: Not on file    ROS See HPI       Objective  Today's Vitals   07/28/22 1101 07/28/22 1140  BP: (Abnormal) 148/97 (Abnormal) 141/83  Pulse: 67   SpO2: 97%   Weight: 193 lb 1.9 oz (87.6 kg)   Height: 5\' 6"  (1.676 m)    Body mass index is 31.17 kg/m.   Physical Exam Vitals and nursing note reviewed.  Constitutional:      Appearance: Normal appearance. He is well-developed.  HENT:     Head: Normocephalic and atraumatic.     Nose: Nose normal.     Mouth/Throat:     Mouth: Mucous membranes are moist.     Pharynx: Oropharynx is clear.  Eyes:     Extraocular Movements: Extraocular movements intact.     Conjunctiva/sclera: Conjunctivae normal.     Pupils:  Pupils are equal, round, and reactive to light.  Neck:     Vascular: No carotid bruit.  Cardiovascular:     Rate and Rhythm: Normal rate and regular rhythm.     Pulses: Normal pulses.     Heart sounds: Normal heart sounds.  Pulmonary:     Effort: Pulmonary effort is normal.     Breath sounds: Normal breath sounds.  Abdominal:     Palpations: Abdomen is soft.  Musculoskeletal:        General: Normal range of motion.     Cervical back: Normal range of motion and neck supple.  Lymphadenopathy:     Cervical: No cervical adenopathy.  Skin:    General: Skin is warm and dry.     Capillary Refill: Capillary refill takes less than 2 seconds.  Neurological:     General: No focal deficit present.     Mental Status: He is alert and oriented to person, place, and time.  Psychiatric:        Mood and Affect: Mood normal.        Behavior: Behavior normal.        Thought Content: Thought content normal.        Judgment: Judgment normal.        Assessment & Plan:  Moderate persistent asthma without complication Assessment & Plan: Continue to use symbicort twice daily  -trial AirSupra - use 2 inhalations four times daily as needed for wheezing/SOB -reassess in 3 months.   Orders: -     Airsupra; Inhale 2 Inhalations into the lungs 4 (four) times daily as needed.  Dispense: 10 g; Refill: 3  Class 1 obesity due to excess calories without serious comorbidity with body mass index (BMI) of 31.0 to 31.9 in adult Assessment & Plan: Discussed lowering calorie intake to 1500 calories per day and incorporating exercise into daily routine to help lose weight.    Encounter to establish care    Return in about 3 months (around 10/27/2022) for asthma - started [place] .   Carlean Jews, NP

## 2022-08-09 ENCOUNTER — Other Ambulatory Visit: Payer: Self-pay

## 2022-08-09 ENCOUNTER — Other Ambulatory Visit (INDEPENDENT_AMBULATORY_CARE_PROVIDER_SITE_OTHER): Payer: Self-pay | Admitting: Primary Care

## 2022-08-09 DIAGNOSIS — Z760 Encounter for issue of repeat prescription: Secondary | ICD-10-CM

## 2022-08-09 DIAGNOSIS — J4540 Moderate persistent asthma, uncomplicated: Secondary | ICD-10-CM

## 2022-08-09 DIAGNOSIS — R12 Heartburn: Secondary | ICD-10-CM

## 2022-08-09 NOTE — Telephone Encounter (Signed)
No longer under prescriber care, will refuse.   Requested Prescriptions  Refused Prescriptions Disp Refills   omeprazole (PRILOSEC) 40 MG capsule 90 capsule 0    Sig: Take 1 capsule (40 mg total) by mouth daily.     Gastroenterology: Proton Pump Inhibitors Passed - 08/09/2022  1:28 PM      Passed - Valid encounter within last 12 months    Recent Outpatient Visits           4 months ago Essential hypertension   [facility] Grayce Sessions, NP   1 year ago Spinal stenosis in cervical region   [place] [facility] Grayce Sessions, NP   1 year ago Umbilical hernia with obstruction, without gangrene   [place] [facility] Grayce Sessions, NP   2 years ago Acute bilateral low back pain, unspecified whether sciatica present   [place] [facility] Grayce Sessions, NP   2 years ago Acute bilateral low back pain, unspecified whether sciatica present   [place] [facility] Grayce Sessions, NP               albuterol (VENTOLIN HFA) 108 (90 Base) MCG/ACT inhaler 18 g 0    Sig: Inhale 2 puffs into the lungs every 6 (six) hours as needed for wheezing or shortness of breath.     Pulmonology:  Beta Agonists 2 Failed - 08/09/2022  1:28 PM      Failed - Last BP in normal range    BP Readings from Last 1 Encounters:  07/28/22 (!) 141/83         Passed - Last Heart Rate in normal range    Pulse Readings from Last 1 Encounters:  07/28/22 67         Passed - Valid encounter within last 12 months    Recent Outpatient Visits           4 months ago Essential hypertension   [place] [facility] Grayce Sessions, NP   1 year ago Spinal stenosis in cervical region   [place] [facility] Grayce Sessions, NP   1 year ago Umbilical hernia with obstruction, without gangrene   [place] [facility] Grayce Sessions, NP   2 years ago Acute bilateral low back pain, unspecified whether sciatica present   [place] [facility] Grayce Sessions, NP   2 years ago Acute bilateral low back pain, unspecified whether sciatica present   [place] [facility] Grayce Sessions, NP

## 2022-08-11 ENCOUNTER — Other Ambulatory Visit (HOSPITAL_BASED_OUTPATIENT_CLINIC_OR_DEPARTMENT_OTHER): Payer: Self-pay

## 2022-08-13 ENCOUNTER — Other Ambulatory Visit (INDEPENDENT_AMBULATORY_CARE_PROVIDER_SITE_OTHER): Payer: Self-pay | Admitting: Primary Care

## 2022-08-13 DIAGNOSIS — R12 Heartburn: Secondary | ICD-10-CM

## 2022-08-13 DIAGNOSIS — Z760 Encounter for issue of repeat prescription: Secondary | ICD-10-CM

## 2022-08-13 DIAGNOSIS — J4540 Moderate persistent asthma, uncomplicated: Secondary | ICD-10-CM

## 2022-08-14 ENCOUNTER — Other Ambulatory Visit: Payer: Self-pay

## 2022-08-16 ENCOUNTER — Other Ambulatory Visit: Payer: Self-pay

## 2022-08-18 ENCOUNTER — Other Ambulatory Visit: Payer: Self-pay

## 2022-08-18 ENCOUNTER — Other Ambulatory Visit (INDEPENDENT_AMBULATORY_CARE_PROVIDER_SITE_OTHER): Payer: Self-pay | Admitting: Nurse Practitioner

## 2022-08-18 ENCOUNTER — Other Ambulatory Visit (INDEPENDENT_AMBULATORY_CARE_PROVIDER_SITE_OTHER): Payer: Self-pay | Admitting: Primary Care

## 2022-08-18 DIAGNOSIS — J4540 Moderate persistent asthma, uncomplicated: Secondary | ICD-10-CM

## 2022-08-21 ENCOUNTER — Other Ambulatory Visit: Payer: Self-pay

## 2022-08-21 MED ORDER — ALBUTEROL SULFATE HFA 108 (90 BASE) MCG/ACT IN AERS
2.0000 | INHALATION_SPRAY | Freq: Four times a day (QID) | RESPIRATORY_TRACT | 0 refills | Status: DC | PRN
  Filled 2022-08-21: qty 18, 25d supply, fill #0

## 2022-08-29 DIAGNOSIS — J4540 Moderate persistent asthma, uncomplicated: Secondary | ICD-10-CM | POA: Insufficient documentation

## 2022-08-29 DIAGNOSIS — E6609 Other obesity due to excess calories: Secondary | ICD-10-CM | POA: Insufficient documentation

## 2022-08-29 NOTE — Assessment & Plan Note (Signed)
Discussed lowering calorie intake to 1500 calories per day and incorporating exercise into daily routine to help lose weight.  °

## 2022-08-29 NOTE — Assessment & Plan Note (Signed)
Continue to use symbicort twice daily  -trial AirSupra - use 2 inhalations four times daily as needed for wheezing/SOB -reassess in 3 months.

## 2022-08-30 ENCOUNTER — Other Ambulatory Visit (INDEPENDENT_AMBULATORY_CARE_PROVIDER_SITE_OTHER): Payer: Self-pay | Admitting: Nurse Practitioner

## 2022-08-30 DIAGNOSIS — J4540 Moderate persistent asthma, uncomplicated: Secondary | ICD-10-CM

## 2022-08-31 ENCOUNTER — Other Ambulatory Visit: Payer: Self-pay

## 2022-09-04 ENCOUNTER — Other Ambulatory Visit: Payer: Self-pay

## 2022-09-04 ENCOUNTER — Other Ambulatory Visit (INDEPENDENT_AMBULATORY_CARE_PROVIDER_SITE_OTHER): Payer: Self-pay | Admitting: Nurse Practitioner

## 2022-09-04 DIAGNOSIS — J4540 Moderate persistent asthma, uncomplicated: Secondary | ICD-10-CM

## 2022-09-05 ENCOUNTER — Other Ambulatory Visit: Payer: Self-pay

## 2022-09-13 ENCOUNTER — Other Ambulatory Visit (INDEPENDENT_AMBULATORY_CARE_PROVIDER_SITE_OTHER): Payer: Self-pay | Admitting: Nurse Practitioner

## 2022-09-13 ENCOUNTER — Other Ambulatory Visit: Payer: Self-pay

## 2022-09-13 ENCOUNTER — Other Ambulatory Visit (INDEPENDENT_AMBULATORY_CARE_PROVIDER_SITE_OTHER): Payer: Self-pay | Admitting: Primary Care

## 2022-09-13 DIAGNOSIS — J4540 Moderate persistent asthma, uncomplicated: Secondary | ICD-10-CM

## 2022-09-14 ENCOUNTER — Other Ambulatory Visit (INDEPENDENT_AMBULATORY_CARE_PROVIDER_SITE_OTHER): Payer: Self-pay | Admitting: Nurse Practitioner

## 2022-09-14 ENCOUNTER — Other Ambulatory Visit: Payer: Self-pay

## 2022-09-14 DIAGNOSIS — J4540 Moderate persistent asthma, uncomplicated: Secondary | ICD-10-CM

## 2022-09-14 MED ORDER — ALBUTEROL SULFATE HFA 108 (90 BASE) MCG/ACT IN AERS
2.0000 | INHALATION_SPRAY | Freq: Four times a day (QID) | RESPIRATORY_TRACT | 2 refills | Status: DC | PRN
Start: 2022-09-14 — End: 2022-10-30
  Filled 2022-09-14: qty 18, 25d supply, fill #0
  Filled 2022-10-03: qty 18, 25d supply, fill #1
  Filled 2022-10-23: qty 18, 25d supply, fill #2

## 2022-09-15 ENCOUNTER — Other Ambulatory Visit: Payer: Self-pay

## 2022-09-18 ENCOUNTER — Other Ambulatory Visit: Payer: Self-pay

## 2022-09-27 ENCOUNTER — Ambulatory Visit: Payer: Medicaid Other | Admitting: Nurse Practitioner

## 2022-09-27 ENCOUNTER — Encounter: Payer: Self-pay | Admitting: Nurse Practitioner

## 2022-09-27 ENCOUNTER — Ambulatory Visit (INDEPENDENT_AMBULATORY_CARE_PROVIDER_SITE_OTHER): Payer: Medicaid Other | Admitting: Nurse Practitioner

## 2022-09-27 VITALS — BP 122/83 | HR 63 | Ht 66.0 in | Wt 190.0 lb

## 2022-09-27 DIAGNOSIS — B370 Candidal stomatitis: Secondary | ICD-10-CM | POA: Diagnosis not present

## 2022-09-27 DIAGNOSIS — L301 Dyshidrosis [pompholyx]: Secondary | ICD-10-CM | POA: Diagnosis not present

## 2022-09-27 DIAGNOSIS — J4540 Moderate persistent asthma, uncomplicated: Secondary | ICD-10-CM | POA: Diagnosis not present

## 2022-09-27 MED ORDER — TACROLIMUS 0.1 % EX OINT
TOPICAL_OINTMENT | Freq: Two times a day (BID) | CUTANEOUS | 0 refills | Status: DC
Start: 2022-09-27 — End: 2023-04-05

## 2022-09-27 MED ORDER — NYSTATIN 100000 UNIT/ML MT SUSP
5.0000 mL | Freq: Four times a day (QID) | OROMUCOSAL | 1 refills | Status: DC
Start: 2022-09-27 — End: 2023-08-29

## 2022-09-27 MED ORDER — PREDNISONE 10 MG (21) PO TBPK
ORAL_TABLET | ORAL | 0 refills | Status: DC
Start: 2022-09-27 — End: 2023-08-29

## 2022-09-27 MED ORDER — FLUCONAZOLE 150 MG PO TABS
ORAL_TABLET | ORAL | 0 refills | Status: DC
Start: 2022-09-27 — End: 2023-08-29

## 2022-09-27 NOTE — Progress Notes (Signed)
Established patient visit   Patient: Shawn Raymond   DOB: [birth year]   42 y.o. Male  MRN: [id number] Visit Date: 09/27/2022  Chief Complaint  Patient presents with   Rash   Subjective    Rash This is a recurrent problem. The problem has been waxing and waning since onset. The affected locations include the left wrist, left hand, right hand, right wrist, right fingers and left fingers. The rash is characterized by itchiness, dryness, scaling and redness. He was exposed to nothing. Past treatments include topical steroids and antibiotic cream. The treatment provided no relief. His past medical history is significant for asthma and eczema. There is no history of allergies.    Reporting plaques of thrush.  -has been going on for a week  -uses Symbicort due to moderate, persistent asthma.  -does rinse his mouth  every time he uses his inhalers.   Medications: Outpatient Medications Prior to Visit  Medication Sig   albuterol (VENTOLIN HFA) 108 (90 Base) MCG/ACT inhaler Inhale 2 puffs into the lungs every 6 (six) hours as needed for wheezing or shortness of breath.   methocarbamol (ROBAXIN) 500 MG tablet TAKE 1 TABLET (500 MG TOTAL) BY MOUTH 3 (THREE) TIMES DAILY.   omeprazole (PRILOSEC) 40 MG capsule Take 1 capsule (40 mg total) by mouth daily.   oxybutynin (DITROPAN-XL) 10 MG 24 hr tablet Take 1 tablet (10 mg total) by mouth daily as directed.   [DISCONTINUED] budesonide-formoterol (SYMBICORT) 160-4.5 MCG/ACT inhaler Inhale 2 puffs into the lungs 2 (two) times daily.   [DISCONTINUED] Albuterol-Budesonide (AIRSUPRA) 90-80 MCG/ACT AERO Inhale 2 Inhalations into the lungs 4 (four) times daily as needed.   No facility-administered medications prior to visit.    Review of Systems  Skin:  Positive for rash.     Objective     Today's Vitals   09/27/22 1621  BP: 122/83  Pulse: 63  SpO2: 98%  Weight: 190 lb (86.2 kg)  Height: 5\' 6"  (1.676 m)   Body mass index is 30.67 kg/m.    Physical Exam Vitals and nursing note reviewed.  Constitutional:      Appearance: Normal appearance. He is well-developed.  HENT:     Head: Normocephalic and atraumatic.     Nose: Nose normal.     Mouth/Throat:     Mouth: Mucous membranes are moist.     Pharynx: Oropharynx is clear.     Comments: Plaques of thrush on the tongue and buccal mucosa.  Eyes:     Extraocular Movements: Extraocular movements intact.     Conjunctiva/sclera: Conjunctivae normal.     Pupils: Pupils are equal, round, and reactive to light.  Neck:     Vascular: No carotid bruit.  Cardiovascular:     Rate and Rhythm: Normal rate and regular rhythm.     Pulses: Normal pulses.     Heart sounds: Normal heart sounds.  Pulmonary:     Effort: Pulmonary effort is normal.     Breath sounds: Normal breath sounds.  Abdominal:     Palpations: Abdomen is soft.  Musculoskeletal:        General: Normal range of motion.     Cervical back: Normal range of motion and neck supple.  Lymphadenopathy:     Cervical: No cervical adenopathy.  Skin:    General: Skin is warm and dry.     Capillary Refill: Capillary refill takes less than 2 seconds.     Comments: Dry, rough, cracked skin on the base of the  palms of the hands. Stretches up to bilateral thumbs. Itchy and dry. Skin is peeling. No drainange noted.   Neurological:     General: No focal deficit present.     Mental Status: He is alert and oriented to person, place, and time.  Psychiatric:        Mood and Affect: Mood normal.        Behavior: Behavior normal.        Thought Content: Thought content normal.        Judgment: Judgment normal.      Assessment & Plan     Moderate persistent asthma without complication Assessment & Plan: Stable. -Continue inhalers as previously prescribed. -Strongly recommend he rinse mouth with warm water after every use of inhalers.   Oral thrush Assessment & Plan: Start Diflucan 150 mg. -Take 1 tablet today.  May repeat  in 3 days if symptoms are persistent. -Add nystatin rinse.  Swish and swallow with 5 mL 3 times daily and after each use of inhalers.  Orders: -     Fluconazole; Take 1 tablet po once. May repeat dose in 3 days as needed for persistent symptoms.  Dispense: 3 tablet; Refill: 0 -     Nystatin; Take 5 mLs (500,000 Units total) by mouth 4 (four) times daily.  Dispense: 200 mL; Refill: 1  Dyshydrosis Assessment & Plan: Start prednisone taper. -Take as directed for 6 days. -Start Protopic 0.1% ointment.  Use twice daily. -Consider referral to dermatology If indicated.  Orders: -     predniSONE; 6 day taper - take by mouth as directed for 6 days  Dispense: 21 tablet; Refill: 0 -     Tacrolimus; Apply topically 2 (two) times daily.  Dispense: 100 g; Refill: 0     Return for prn worsening or persistent symptoms.        Carlean Jews, NP  [facility] at [facility] [phone] (phone) [phone] (fax)  [facility]

## 2022-10-03 ENCOUNTER — Other Ambulatory Visit: Payer: Self-pay | Admitting: Nurse Practitioner

## 2022-10-03 ENCOUNTER — Other Ambulatory Visit: Payer: Self-pay

## 2022-10-03 ENCOUNTER — Other Ambulatory Visit (INDEPENDENT_AMBULATORY_CARE_PROVIDER_SITE_OTHER): Payer: Self-pay | Admitting: Primary Care

## 2022-10-03 DIAGNOSIS — L301 Dyshidrosis [pompholyx]: Secondary | ICD-10-CM

## 2022-10-03 DIAGNOSIS — B370 Candidal stomatitis: Secondary | ICD-10-CM

## 2022-10-03 DIAGNOSIS — R12 Heartburn: Secondary | ICD-10-CM

## 2022-10-03 DIAGNOSIS — Z760 Encounter for issue of repeat prescription: Secondary | ICD-10-CM

## 2022-10-03 NOTE — Telephone Encounter (Signed)
Pt no longer under prescribers care  Requested Prescriptions  Refused Prescriptions Disp Refills   budesonide-formoterol (SYMBICORT) 160-4.5 MCG/ACT inhaler 10.2 g 6    Sig: Inhale 2 puffs into the lungs 2 (two) times daily.     Pulmonology:  Combination Products Passed - 10/03/2022 10:03 AM      Passed - Valid encounter within last 12 months    Recent Outpatient Visits           6 months ago Essential hypertension   [place] [facility] Grayce Sessions, NP   1 year ago Spinal stenosis in cervical region   [place] [facility] Grayce Sessions, NP   2 years ago Umbilical hernia with obstruction, without gangrene   [place] [facility] Grayce Sessions, NP   2 years ago Acute bilateral low back pain, unspecified whether sciatica present   [place] [facility] Grayce Sessions, NP   2 years ago Acute bilateral low back pain, unspecified whether sciatica present   [place] [facility] Grayce Sessions, NP

## 2022-10-04 ENCOUNTER — Other Ambulatory Visit (INDEPENDENT_AMBULATORY_CARE_PROVIDER_SITE_OTHER): Payer: Self-pay | Admitting: Nurse Practitioner

## 2022-10-04 ENCOUNTER — Other Ambulatory Visit: Payer: Self-pay

## 2022-10-04 DIAGNOSIS — R12 Heartburn: Secondary | ICD-10-CM

## 2022-10-04 DIAGNOSIS — Z760 Encounter for issue of repeat prescription: Secondary | ICD-10-CM

## 2022-10-04 NOTE — Telephone Encounter (Signed)
Unable to refill per protocol, no longer under prescriber's care.  Requested Prescriptions  Pending Prescriptions Disp Refills   budesonide-formoterol (SYMBICORT) 160-4.5 MCG/ACT inhaler 10.2 g 6    Sig: Inhale 2 puffs into the lungs 2 (two) times daily.     Pulmonology:  Combination Products Passed - 10/03/2022 11:01 PM      Passed - Valid encounter within last 12 months    Recent Outpatient Visits           6 months ago Essential hypertension   [place] [facility] Grayce Sessions, NP   1 year ago Spinal stenosis in cervical region   [place] [facility] Grayce Sessions, NP   2 years ago Umbilical hernia with obstruction, without gangrene   [place] [facility] Grayce Sessions, NP   2 years ago Acute bilateral low back pain, unspecified whether sciatica present   [place] [facility] Grayce Sessions, NP   2 years ago Acute bilateral low back pain, unspecified whether sciatica present   [place] [facility] Grayce Sessions, NP               omeprazole (PRILOSEC) 40 MG capsule 90 capsule 0    Sig: Take 1 capsule (40 mg total) by mouth daily.     Gastroenterology: Proton Pump Inhibitors Passed - 10/03/2022 11:01 PM      Passed - Valid encounter within last 12 months    Recent Outpatient Visits           6 months ago Essential hypertension   [place] [facility] Grayce Sessions, NP   1 year ago Spinal stenosis in cervical region   [place] [facility] Grayce Sessions, NP   2 years ago Umbilical hernia with obstruction, without gangrene   [place] [facility] Grayce Sessions, NP   2 years ago Acute bilateral low back pain, unspecified whether sciatica present   [place] [facility] Grayce Sessions, NP   2 years ago Acute bilateral low back pain, unspecified whether  sciatica present    [facility] Grayce Sessions, NP

## 2022-10-09 ENCOUNTER — Other Ambulatory Visit (INDEPENDENT_AMBULATORY_CARE_PROVIDER_SITE_OTHER): Payer: Self-pay | Admitting: Primary Care

## 2022-10-10 ENCOUNTER — Other Ambulatory Visit (INDEPENDENT_AMBULATORY_CARE_PROVIDER_SITE_OTHER): Payer: Self-pay | Admitting: Primary Care

## 2022-10-11 ENCOUNTER — Other Ambulatory Visit (INDEPENDENT_AMBULATORY_CARE_PROVIDER_SITE_OTHER): Payer: Self-pay | Admitting: Nurse Practitioner

## 2022-10-11 ENCOUNTER — Other Ambulatory Visit: Payer: Self-pay

## 2022-10-11 MED ORDER — BUDESONIDE-FORMOTEROL FUMARATE 160-4.5 MCG/ACT IN AERO
2.0000 | INHALATION_SPRAY | Freq: Two times a day (BID) | RESPIRATORY_TRACT | 6 refills | Status: DC
  Filled 2022-10-11: qty 10.2, 30d supply, fill #0
  Filled 2022-10-23 – 2022-11-06 (×6): qty 10.2, 30d supply, fill #1
  Filled 2022-11-20 – 2022-12-01 (×2): qty 10.2, 30d supply, fill #2
  Filled 2022-12-18 – 2022-12-26 (×2): qty 10.2, 30d supply, fill #3
  Filled 2023-01-15 – 2023-01-17 (×2): qty 10.2, 30d supply, fill #4
  Filled 2023-02-05 – 2023-02-08 (×3): qty 10.2, 30d supply, fill #5
  Filled 2023-03-05: qty 10.2, 30d supply, fill #6

## 2022-10-12 ENCOUNTER — Other Ambulatory Visit: Payer: Self-pay

## 2022-10-12 ENCOUNTER — Other Ambulatory Visit (INDEPENDENT_AMBULATORY_CARE_PROVIDER_SITE_OTHER): Payer: Self-pay | Admitting: Primary Care

## 2022-10-12 DIAGNOSIS — R12 Heartburn: Secondary | ICD-10-CM

## 2022-10-12 DIAGNOSIS — Z760 Encounter for issue of repeat prescription: Secondary | ICD-10-CM

## 2022-10-15 ENCOUNTER — Other Ambulatory Visit (INDEPENDENT_AMBULATORY_CARE_PROVIDER_SITE_OTHER): Payer: Self-pay | Admitting: Primary Care

## 2022-10-15 DIAGNOSIS — B370 Candidal stomatitis: Secondary | ICD-10-CM | POA: Insufficient documentation

## 2022-10-15 DIAGNOSIS — L309 Dermatitis, unspecified: Secondary | ICD-10-CM | POA: Insufficient documentation

## 2022-10-15 DIAGNOSIS — Z760 Encounter for issue of repeat prescription: Secondary | ICD-10-CM

## 2022-10-15 DIAGNOSIS — L301 Dyshidrosis [pompholyx]: Secondary | ICD-10-CM | POA: Insufficient documentation

## 2022-10-15 DIAGNOSIS — R12 Heartburn: Secondary | ICD-10-CM

## 2022-10-15 MED ORDER — OMEPRAZOLE 40 MG PO CPDR
40.0000 mg | DELAYED_RELEASE_CAPSULE | Freq: Every day | ORAL | 0 refills | Status: DC
Start: 2022-10-15 — End: 2022-12-01
  Filled 2022-10-15: qty 90, 90d supply, fill #0

## 2022-10-15 NOTE — Assessment & Plan Note (Signed)
Start prednisone taper. -Take as directed for 6 days. -Start Protopic 0.1% ointment.  Use twice daily. -Consider referral to dermatology If indicated.

## 2022-10-15 NOTE — Assessment & Plan Note (Signed)
Stable. -Continue inhalers as previously prescribed. -Strongly recommend he rinse mouth with warm water after every use of inhalers.

## 2022-10-15 NOTE — Assessment & Plan Note (Signed)
Start Diflucan 150 mg. -Take 1 tablet today.  May repeat in 3 days if symptoms are persistent. -Add nystatin rinse.  Swish and swallow with 5 mL 3 times daily and after each use of inhalers.

## 2022-10-16 ENCOUNTER — Other Ambulatory Visit: Payer: Self-pay

## 2022-10-20 ENCOUNTER — Other Ambulatory Visit: Payer: Self-pay

## 2022-10-23 ENCOUNTER — Other Ambulatory Visit: Payer: Self-pay

## 2022-10-23 ENCOUNTER — Other Ambulatory Visit (INDEPENDENT_AMBULATORY_CARE_PROVIDER_SITE_OTHER): Payer: Self-pay | Admitting: Primary Care

## 2022-10-23 DIAGNOSIS — R12 Heartburn: Secondary | ICD-10-CM

## 2022-10-23 DIAGNOSIS — Z760 Encounter for issue of repeat prescription: Secondary | ICD-10-CM

## 2022-10-24 ENCOUNTER — Other Ambulatory Visit: Payer: Self-pay

## 2022-10-24 NOTE — Telephone Encounter (Signed)
Receipt not confirmed, need to verify with pharmacy

## 2022-10-30 ENCOUNTER — Other Ambulatory Visit: Payer: Self-pay

## 2022-10-30 ENCOUNTER — Other Ambulatory Visit (INDEPENDENT_AMBULATORY_CARE_PROVIDER_SITE_OTHER): Payer: Self-pay | Admitting: Nurse Practitioner

## 2022-10-30 DIAGNOSIS — J4540 Moderate persistent asthma, uncomplicated: Secondary | ICD-10-CM

## 2022-10-31 ENCOUNTER — Other Ambulatory Visit: Payer: Self-pay

## 2022-10-31 ENCOUNTER — Ambulatory Visit: Payer: Medicaid Other | Admitting: Nurse Practitioner

## 2022-10-31 MED ORDER — ALBUTEROL SULFATE HFA 108 (90 BASE) MCG/ACT IN AERS
2.0000 | INHALATION_SPRAY | Freq: Four times a day (QID) | RESPIRATORY_TRACT | 2 refills | Status: AC | PRN
Start: 2022-10-31 — End: ?
  Filled 2022-10-31 – 2022-11-20 (×4): qty 18, 25d supply, fill #0
  Filled 2022-12-01 – 2022-12-12 (×2): qty 18, 25d supply, fill #1
  Filled 2023-01-01: qty 18, 25d supply, fill #2

## 2022-11-06 ENCOUNTER — Other Ambulatory Visit: Payer: Self-pay

## 2022-11-07 ENCOUNTER — Other Ambulatory Visit: Payer: Self-pay

## 2022-11-09 ENCOUNTER — Ambulatory Visit: Payer: Medicaid Other | Admitting: Family Medicine

## 2022-11-09 ENCOUNTER — Encounter: Payer: Self-pay | Admitting: Family Medicine

## 2022-11-09 NOTE — Progress Notes (Deleted)
   Established Patient Office Visit  Subjective   Patient ID: Shawn Raymond, male    DOB: 12-Jul-1980  Age: 42 y.o. MRN: [id number]  No chief complaint on file.   HPI  Asthma - Paul[place] Floor?    The 10-year ASCVD risk score (Arnett DK, et al., 2019) is: 3.7%   {History (Optional):23778}  ROS    Objective:     There were no vitals taken for this visit. {Vitals History (Optional):23777}  Physical Exam   No results found for any visits on 11/09/22.  {Labs (Optional):23779}      Assessment & Plan:   There are no diagnoses linked to this encounter.   No follow-ups on file.    Sandre Kitty, MD

## 2022-11-20 ENCOUNTER — Other Ambulatory Visit: Payer: Self-pay

## 2022-12-01 ENCOUNTER — Other Ambulatory Visit: Payer: Self-pay

## 2022-12-01 ENCOUNTER — Other Ambulatory Visit (INDEPENDENT_AMBULATORY_CARE_PROVIDER_SITE_OTHER): Payer: Self-pay | Admitting: Primary Care

## 2022-12-01 DIAGNOSIS — R12 Heartburn: Secondary | ICD-10-CM

## 2022-12-01 DIAGNOSIS — Z760 Encounter for issue of repeat prescription: Secondary | ICD-10-CM

## 2022-12-01 MED ORDER — OMEPRAZOLE 40 MG PO CPDR
40.0000 mg | DELAYED_RELEASE_CAPSULE | Freq: Every day | ORAL | 0 refills | Status: DC
Start: 2022-12-01 — End: 2023-04-14
  Filled 2022-12-01 – 2023-01-01 (×5): qty 90, 90d supply, fill #0

## 2022-12-04 ENCOUNTER — Other Ambulatory Visit: Payer: Self-pay

## 2022-12-13 ENCOUNTER — Other Ambulatory Visit: Payer: Self-pay

## 2022-12-19 ENCOUNTER — Other Ambulatory Visit: Payer: Self-pay

## 2022-12-26 ENCOUNTER — Other Ambulatory Visit: Payer: Self-pay

## 2022-12-27 ENCOUNTER — Other Ambulatory Visit: Payer: Self-pay

## 2023-01-01 ENCOUNTER — Other Ambulatory Visit: Payer: Self-pay

## 2023-01-15 ENCOUNTER — Other Ambulatory Visit (INDEPENDENT_AMBULATORY_CARE_PROVIDER_SITE_OTHER): Payer: Self-pay | Admitting: Family Medicine

## 2023-01-15 ENCOUNTER — Other Ambulatory Visit: Payer: Self-pay

## 2023-01-15 ENCOUNTER — Ambulatory Visit (INDEPENDENT_AMBULATORY_CARE_PROVIDER_SITE_OTHER): Payer: Medicaid Other | Admitting: Family Medicine

## 2023-01-15 VITALS — BP 118/76 | HR 64 | Ht 66.0 in | Wt 199.1 lb

## 2023-01-15 DIAGNOSIS — E782 Mixed hyperlipidemia: Secondary | ICD-10-CM

## 2023-01-15 DIAGNOSIS — J4540 Moderate persistent asthma, uncomplicated: Secondary | ICD-10-CM

## 2023-01-15 DIAGNOSIS — R7989 Other specified abnormal findings of blood chemistry: Secondary | ICD-10-CM | POA: Diagnosis not present

## 2023-01-15 DIAGNOSIS — L2082 Flexural eczema: Secondary | ICD-10-CM

## 2023-01-15 DIAGNOSIS — S20219A Contusion of unspecified front wall of thorax, initial encounter: Secondary | ICD-10-CM | POA: Insufficient documentation

## 2023-01-15 DIAGNOSIS — S20211D Contusion of right front wall of thorax, subsequent encounter: Secondary | ICD-10-CM

## 2023-01-15 MED ORDER — TRIAMCINOLONE ACETONIDE 0.5 % EX OINT: 1.0000 | TOPICAL_OINTMENT | Freq: Two times a day (BID) | CUTANEOUS | 1 refills | Status: DC

## 2023-01-15 MED ORDER — ALBUTEROL SULFATE HFA 108 (90 BASE) MCG/ACT IN AERS
2.0000 | INHALATION_SPRAY | Freq: Four times a day (QID) | RESPIRATORY_TRACT | 2 refills | Status: DC | PRN
Start: 2023-01-15 — End: 2023-04-02

## 2023-01-15 MED ORDER — TRAMADOL HCL 50 MG PO TABS: 50.0000 mg | ORAL_TABLET | Freq: Three times a day (TID) | ORAL | 0 refills | Status: DC | PRN

## 2023-01-15 MED ORDER — LIDOCAINE 4 % EX PTCH: 1.0000 | MEDICATED_PATCH | CUTANEOUS | 1 refills | Status: DC

## 2023-01-15 MED ORDER — ALBUTEROL SULFATE HFA 108 (90 BASE) MCG/ACT IN AERS
2.0000 | INHALATION_SPRAY | Freq: Four times a day (QID) | RESPIRATORY_TRACT | 2 refills | Status: DC | PRN
Start: 2023-01-15 — End: 2023-01-15
  Filled 2023-01-15: qty 18, 25d supply, fill #0

## 2023-01-15 MED ORDER — KETOROLAC TROMETHAMINE 10 MG PO TABS: 10.0000 mg | ORAL_TABLET | Freq: Four times a day (QID) | ORAL | 0 refills | Status: DC | PRN

## 2023-01-15 NOTE — Assessment & Plan Note (Signed)
Patient fell into his bathtub 3 days ago.  Now with significant pain that is worse with breathing or movement.  Negative for pneumothorax and fracture in the ED. - Tylenol 1000 mg 3 times daily - Topical heat/cold therapy - Refill tramadol and Toradol - Sent in lidocaine patch prescription - Follow-up in 2 weeks - Asthma inhaler refilled to help prevent coughing.

## 2023-01-15 NOTE — Progress Notes (Signed)
Established Patient Office Visit  Subjective   Patient ID: Shawn Raymond, male    DOB: 08-Apr-1981  Age: 42 y.o. MRN: [id number]  Chief Complaint  Patient presents with   Medical Management of Chronic Issues    HPI Rib pain-patient on Saturday fell into his bathtub and hit his ribs on the right side.  Did not lose consciousness or faint, just lost his footing.  He went to the emergency department.  Fracture and pneumothorax were ruled out.  He was given tramadol, lidocaine patch, Toradol for pain relief.  He was unable to fill the lidocaine patch.  He was given 3 days of the tramadol and Toradol and is almost out of both.  He is out of his asthma inhaler.  Patient wants to know if he can be prescribed a cream for his palm and wrist.  He has a rash on there that is chronic.  He tries to put over-the-counter lotions on them to keep them hydrated.  Notices a are more prominent when he is stressed out.    The 10-year ASCVD risk score (Arnett DK, et al., 2019) is: 3.4%  Health Maintenance Due  Topic Date Due   Hepatitis C Screening  Never done   INFLUENZA VACCINE  Never done   COVID-19 Vaccine (1 - 2023-24 season) Never done      Objective:     BP 118/76   Pulse 64   Ht 5\' 6"  (1.676 m)   Wt 199 lb 1.9 oz (90.3 kg)   SpO2 99%   BMI 32.14 kg/m    Physical Exam General: Alert, oriented CV: Regular rate and rhythm MSK: Tenderness to palpation over the right laterally and anterolaterally Skin: Thickened hyperpigmented rash on the wrist anteriorly with similar rash on the palms bilaterally.   No results found for any visits on 01/15/23.      Assessment & Plan:   Moderate mixed hyperlipidemia not requiring statin therapy -     Lipid panel  Moderate persistent asthma without complication -     Albuterol Sulfate HFA; Inhale 2 puffs into the lungs every 6 (six) hours as needed for wheezing or shortness of breath.  Dispense: 18 g; Refill: 2  Elevated serum  creatinine -     Basic metabolic panel  Contusion of rib on right side, subsequent encounter Assessment & Plan: Patient fell into his bathtub 3 days ago.  Now with significant pain that is worse with breathing or movement.  Negative for pneumothorax and fracture in the ED. - Tylenol 1000 mg 3 times daily - Topical heat/cold therapy - Refill tramadol and Toradol - Sent in lidocaine patch prescription - Follow-up in 2 weeks - Asthma inhaler refilled to help prevent coughing.   Flexural eczema Assessment & Plan: On the palms and wrist bilaterally.  Will prescribe triamcinolone 0.5 ointment twice daily and recommended continued use of emollients.  Will follow-up again in 2 weeks.   Other orders -     Triamcinolone Acetonide; Apply 1 Application topically 2 (two) times daily. Apply to palm and wrist for two weeks.  Dispense: 60 g; Refill: 1 -     Lidocaine; Place 1 patch onto the skin daily.  Dispense: 15 patch; Refill: 1 -     traMADol HCl; Take 1 tablet (50 mg total) by mouth every 8 (eight) hours as needed for up to 10 days.  Dispense: 30 tablet; Refill: 0 -     Ketorolac Tromethamine; Take 1 tablet (10 mg total)  by mouth every 6 (six) hours as needed.  Dispense: 30 tablet; Refill: 0     Return in about 2 weeks (around 01/29/2023) for Follow-up rib pain, eczema.    Sandre Kitty, MD

## 2023-01-15 NOTE — Assessment & Plan Note (Signed)
On the palms and wrist bilaterally.  Will prescribe triamcinolone 0.5 ointment twice daily and recommended continued use of emollients.  Will follow-up again in 2 weeks.

## 2023-01-15 NOTE — Patient Instructions (Addendum)
It was nice to see you today,  We addressed the following topics today: -For symptomatic relief of your rib contusions I would do the following: - You can take 1000 mg of Tylenol every 8 hours - Use ice and/or heat applied to the area for pain relief - Use lidocaine patch prescribed.  If you are unable to get the lidocaine patch there are over-the-counter topical treatments you can use that contain either lidocaine or menthol. - Use the Toradol and tramadol as needed.  Do not use either of these for more than 2 weeks at a time  - I have sent in a refill of your albuterol - I have sent in a topical steroid cream for you to use on your wrist.  This is to treat eczema.  Use this twice a day for the next 2 weeks.  You can also use topical over-the-counter emollients or Vaseline as needed to help keep the skin from becoming dry.  Have a great day,  Frederic Jericho, MD

## 2023-01-16 LAB — LIPID PANEL
Chol/HDL Ratio: 4.9 {ratio} (ref 0.0–5.0)
Cholesterol, Total: 211 mg/dL — ABNORMAL HIGH (ref 100–199)
HDL: 43 mg/dL (ref >39)
LDL Chol Calc (NIH): 139 mg/dL — ABNORMAL HIGH (ref 0–99)
Triglycerides: 163 mg/dL — ABNORMAL HIGH (ref 0–149)
VLDL Cholesterol Cal: 29 mg/dL (ref 5–40)

## 2023-01-16 LAB — BASIC METABOLIC PANEL
BUN/Creatinine Ratio: 17 (ref 9–20)
BUN: 23 mg/dL (ref 6–24)
CO2: 21 mmol/L (ref 20–29)
Calcium: 9.3 mg/dL (ref 8.7–10.2)
Chloride: 101 mmol/L (ref 96–106)
Creatinine, Ser: 1.34 mg/dL — ABNORMAL HIGH (ref 0.76–1.27)
Glucose: 94 mg/dL (ref 70–99)
Potassium: 4.4 mmol/L (ref 3.5–5.2)
Sodium: 138 mmol/L (ref 134–144)
eGFR: 67 mL/min/{1.73_m2} (ref >59)

## 2023-01-17 ENCOUNTER — Other Ambulatory Visit: Payer: Self-pay

## 2023-01-22 ENCOUNTER — Other Ambulatory Visit: Payer: Self-pay

## 2023-01-23 ENCOUNTER — Other Ambulatory Visit: Payer: Self-pay | Admitting: Family Medicine

## 2023-01-24 MED ORDER — TRAMADOL HCL 50 MG PO TABS: 50.0000 mg | ORAL_TABLET | Freq: Two times a day (BID) | ORAL | 0 refills | Status: DC | PRN

## 2023-01-29 ENCOUNTER — Ambulatory Visit (INDEPENDENT_AMBULATORY_CARE_PROVIDER_SITE_OTHER): Payer: Medicaid Other | Admitting: Family Medicine

## 2023-01-29 ENCOUNTER — Encounter: Payer: Self-pay | Admitting: Family Medicine

## 2023-01-29 VITALS — BP 121/82 | HR 58 | Ht 66.0 in | Wt 192.7 lb

## 2023-01-29 DIAGNOSIS — R7989 Other specified abnormal findings of blood chemistry: Secondary | ICD-10-CM | POA: Diagnosis not present

## 2023-01-29 DIAGNOSIS — L2082 Flexural eczema: Secondary | ICD-10-CM | POA: Diagnosis not present

## 2023-01-29 DIAGNOSIS — S20211D Contusion of right front wall of thorax, subsequent encounter: Secondary | ICD-10-CM | POA: Diagnosis not present

## 2023-01-29 MED ORDER — DICLOFENAC SODIUM 1 % EX GEL: 2.0000 g | Freq: Four times a day (QID) | CUTANEOUS | 2 refills | Status: AC

## 2023-01-29 MED ORDER — TRAMADOL HCL 50 MG PO TABS: 50.0000 mg | ORAL_TABLET | Freq: Two times a day (BID) | ORAL | 0 refills | Status: AC | PRN

## 2023-01-29 NOTE — Progress Notes (Unsigned)
Established Patient Office Visit  Subjective   Patient ID: Shawn Raymond, male    DOB: July 05, 1980  Age: 42 y.o. MRN: [id number]  Chief Complaint  Patient presents with   Medical Management of Chronic Issues    HPI Patient still complaining of rib pain but it is improving.  Mostly when he coughs or laughs, or ambulation other than walking.  He tries to walk around his yard and avoid uneven or hard surfaces..  He took his last dose of Toradol today.  Still taking his tramadol to 3 times a day.  We discussed decreasing how often he takes the tramadol and holding off on NSAIDs for now.  Discussed Voltaren gel.  Patient feels like the rash on his wrist is getting better.  He puts Vaseline on it as well as the steroid cream.  Patient has a spot on his right cheek where he thinks he has an ingrown hair.  Has been an issue for several months.  Sometimes it increases in size and will have some drainage.  He tries to pop it like a pimple.  We discussed ingrown hairs and treatment options.  We discussed the patient's creatinine being elevated.  We discussed repeating and getting a Cystatin C test.    The 10-year ASCVD risk score (Arnett DK, et al., 2019) is: 3.6%  Health Maintenance Due  Topic Date Due   Hepatitis C Screening  Never done   COVID-19 Vaccine (1 - 2023-24 season) Never done      Objective:     BP 121/82   Pulse (!) 58   Ht 5\' 6"  (1.676 m)   Wt 192 lb 11.2 oz (87.4 kg)   SpO2 100%   BMI 31.10 kg/m    Physical Exam General: Alert, oriented HEENT: Small indurated area on the right face along the medical.  Patient has a beard.  Does not come to ahead and does not appear infected.   No results found for any visits on 01/29/23.      Assessment & Plan:   Elevated serum creatinine Assessment & Plan: Most recent creatinine from 2 weeks ago was elevated.  Creatinine was 1.34 with a GFR of 67.  Will repeat and get Cystatin C testing as well.  Orders: -      Basic metabolic panel -     Cystatin C  Contusion of rib on right side, subsequent encounter Assessment & Plan: Pain is slowly improving.  Discussed taking a break from NSAIDs.  Encouraged to use over-the-counter Voltaren gel.  Prescribed 1 more refill of tramadol but advised him to take this 2 or less times a day.  Will not be able to refill it after this.  Orders: -     Diclofenac Sodium; Apply 2 g topically 4 (four) times daily.  Dispense: 50 g; Refill: 2 -     traMADol HCl; Take 1 tablet (50 mg total) by mouth every 12 (twelve) hours as needed for up to 8 days.  Dispense: 15 tablet; Refill: 0  Flexural eczema Assessment & Plan: Improving.  Discussed continuing to use over-the-counter emollient after rash resolves.      Return in about 2 months (around 03/31/2023) for physical.    Sandre Kitty, MD

## 2023-01-29 NOTE — Patient Instructions (Signed)
It was nice to see you today,  We addressed the following topics today: -I will send in another refill for the tramadol to use twice a day but try to use this as little as possible.  I will be able to fill any more tramadol after this - You can use over-the-counter Voltaren gel up to 4 times a day as needed by rubbing it on your ribs.  This is a same ingredient that is in NSAIDs like the Toradol. - I will recheck your kidney function again today - Follow-up with me in 1 to 2 months for your physical.  Have a great day,  Frederic Jericho, MD

## 2023-01-29 NOTE — Assessment & Plan Note (Signed)
Most recent creatinine from 2 weeks ago was elevated.  Creatinine was 1.34 with a GFR of 67.  Will repeat and get Cystatin C testing as well.

## 2023-01-29 NOTE — Assessment & Plan Note (Signed)
Improving.  Discussed continuing to use over-the-counter emollient after rash resolves.

## 2023-01-29 NOTE — Assessment & Plan Note (Signed)
Pain is slowly improving.  Discussed taking a break from NSAIDs.  Encouraged to use over-the-counter Voltaren gel.  Prescribed 1 more refill of tramadol but advised him to take this 2 or less times a day.  Will not be able to refill it after this.

## 2023-01-30 LAB — BASIC METABOLIC PANEL
BUN/Creatinine Ratio: 12 (ref 9–20)
BUN: 16 mg/dL (ref 6–24)
CO2: 22 mmol/L (ref 20–29)
Calcium: 9.4 mg/dL (ref 8.7–10.2)
Chloride: 104 mmol/L (ref 96–106)
Creatinine, Ser: 1.32 mg/dL — ABNORMAL HIGH (ref 0.76–1.27)
Glucose: 85 mg/dL (ref 70–99)
Potassium: 4.7 mmol/L (ref 3.5–5.2)
Sodium: 139 mmol/L (ref 134–144)
eGFR: 69 mL/min/{1.73_m2} (ref >59)

## 2023-01-30 LAB — CYSTATIN C: CYSTATIN C: 0.95 mg/L (ref 0.60–1.00)

## 2023-02-05 ENCOUNTER — Other Ambulatory Visit: Payer: Self-pay

## 2023-02-05 ENCOUNTER — Other Ambulatory Visit (INDEPENDENT_AMBULATORY_CARE_PROVIDER_SITE_OTHER): Payer: Self-pay | Admitting: Primary Care

## 2023-02-05 DIAGNOSIS — R12 Heartburn: Secondary | ICD-10-CM

## 2023-02-05 DIAGNOSIS — Z760 Encounter for issue of repeat prescription: Secondary | ICD-10-CM

## 2023-02-07 ENCOUNTER — Other Ambulatory Visit: Payer: Self-pay

## 2023-02-09 ENCOUNTER — Other Ambulatory Visit: Payer: Self-pay

## 2023-03-05 ENCOUNTER — Other Ambulatory Visit: Payer: Self-pay

## 2023-03-05 ENCOUNTER — Other Ambulatory Visit (INDEPENDENT_AMBULATORY_CARE_PROVIDER_SITE_OTHER): Payer: Self-pay | Admitting: Primary Care

## 2023-03-05 DIAGNOSIS — Z760 Encounter for issue of repeat prescription: Secondary | ICD-10-CM

## 2023-03-05 DIAGNOSIS — R12 Heartburn: Secondary | ICD-10-CM

## 2023-03-08 ENCOUNTER — Other Ambulatory Visit: Payer: Self-pay

## 2023-03-08 DIAGNOSIS — I10 Essential (primary) hypertension: Secondary | ICD-10-CM

## 2023-03-08 DIAGNOSIS — Z0000 Encounter for general adult medical examination without abnormal findings: Secondary | ICD-10-CM

## 2023-03-08 DIAGNOSIS — E6609 Other obesity due to excess calories: Secondary | ICD-10-CM

## 2023-03-20 ENCOUNTER — Other Ambulatory Visit (INDEPENDENT_AMBULATORY_CARE_PROVIDER_SITE_OTHER): Payer: Self-pay | Admitting: Nurse Practitioner

## 2023-03-20 ENCOUNTER — Other Ambulatory Visit (INDEPENDENT_AMBULATORY_CARE_PROVIDER_SITE_OTHER): Payer: Self-pay | Admitting: Primary Care

## 2023-03-20 DIAGNOSIS — M5412 Radiculopathy, cervical region: Secondary | ICD-10-CM

## 2023-03-20 DIAGNOSIS — R12 Heartburn: Secondary | ICD-10-CM

## 2023-03-20 DIAGNOSIS — Z760 Encounter for issue of repeat prescription: Secondary | ICD-10-CM

## 2023-03-21 ENCOUNTER — Other Ambulatory Visit: Payer: Self-pay

## 2023-03-21 MED ORDER — BUDESONIDE-FORMOTEROL FUMARATE 160-4.5 MCG/ACT IN AERO
2.0000 | INHALATION_SPRAY | Freq: Two times a day (BID) | RESPIRATORY_TRACT | 6 refills | Status: DC
  Filled 2023-03-21 – 2023-03-27 (×2): qty 10.2, 30d supply, fill #0

## 2023-03-21 NOTE — Telephone Encounter (Signed)
Shawn Raymond. This patient has you listed as PCP now.

## 2023-03-26 ENCOUNTER — Other Ambulatory Visit: Payer: Medicaid Other

## 2023-03-27 ENCOUNTER — Other Ambulatory Visit: Payer: Self-pay

## 2023-03-27 ENCOUNTER — Other Ambulatory Visit (INDEPENDENT_AMBULATORY_CARE_PROVIDER_SITE_OTHER): Payer: Self-pay | Admitting: Primary Care

## 2023-03-27 DIAGNOSIS — R12 Heartburn: Secondary | ICD-10-CM

## 2023-03-27 DIAGNOSIS — Z760 Encounter for issue of repeat prescription: Secondary | ICD-10-CM

## 2023-03-28 ENCOUNTER — Other Ambulatory Visit: Payer: Self-pay

## 2023-03-28 ENCOUNTER — Other Ambulatory Visit (INDEPENDENT_AMBULATORY_CARE_PROVIDER_SITE_OTHER): Payer: Self-pay | Admitting: Family Medicine

## 2023-03-28 DIAGNOSIS — M5412 Radiculopathy, cervical region: Secondary | ICD-10-CM

## 2023-03-28 DIAGNOSIS — R12 Heartburn: Secondary | ICD-10-CM

## 2023-03-28 DIAGNOSIS — Z760 Encounter for issue of repeat prescription: Secondary | ICD-10-CM

## 2023-03-28 NOTE — Telephone Encounter (Signed)
Please forward to active provider Requested Prescriptions  Pending Prescriptions Disp Refills   omeprazole (PRILOSEC) 40 MG capsule 90 capsule 0    Sig: Take 1 capsule (40 mg total) by mouth daily.     Gastroenterology: Proton Pump Inhibitors Failed - 03/27/2023 12:49 AM      Failed - Valid encounter within last 12 months    Recent Outpatient Visits           1 year ago Essential hypertension   [place] [facility] Grayce Sessions, NP   2 years ago Spinal stenosis in cervical region   [place] [employer] [facility] Grayce Sessions, NP   2 years ago Umbilical hernia with obstruction, without gangrene   [place] [facility] Grayce Sessions, NP   2 years ago Acute bilateral low back pain, unspecified whether sciatica present   [place] [facility] Grayce Sessions, NP   3 years ago Acute bilateral low back pain, unspecified whether sciatica present   [place] [facility] Grayce Sessions, NP       Future Appointments             In 5 days Sandre Kitty, MD [facility]

## 2023-04-01 NOTE — Progress Notes (Unsigned)
   Annual physical  Subjective   Patient ID: JOSEPHUS CRUTHIRDS, male    DOB: 1980/11/29  Age: 42 y.o. MRN: [id number]  No chief complaint on file.  HPI Vitali is a 42 y.o. old male here  for annual exam.   Changes in his/her health in the last 12 months: {yes/no:63}  The patient currently works as a***/does not work***.  He is married/single/in a relationship and recently/not recently sexually active with ***male partner.  He does*** use tobacco***, drinks***days per***, does***use recreational drugs.  The patient eats a***diet.  He exercises***times per week doing***.  The patient does***have an advanced directive.  The patient declines/endorses concerns for impaired hearing.  The patient declines/endorses concerns for impaired vision.  The patient does/does not see a dentist regularly.  The patient does***have a family history of prostate cancer.  The patient does***have a family history of colon cancer.  The patient has the following chronic health issues that are monitored on a routine basis: ***  Rib pain from falling on the bathtub.  GFR was normal after adding Cystatin C  HPI  Separate, acute concerns today: ***  The 10-year ASCVD risk score (Arnett DK, et al., 2019) is: 5.9%  Health Maintenance Due  Topic Date Due   Hepatitis C Screening  Never done   COVID-19 Vaccine (1 - 2024-25 season) Never done      Objective:     There were no vitals taken for this visit. {Vitals History (Optional):23777}  Physical Exam   No results found for any visits on 04/02/23.      Assessment & Plan:   There are no diagnoses linked to this encounter.   No follow-ups on file.    Sandre Kitty, MD

## 2023-04-02 ENCOUNTER — Ambulatory Visit (INDEPENDENT_AMBULATORY_CARE_PROVIDER_SITE_OTHER): Payer: Medicaid Other | Admitting: Family Medicine

## 2023-04-02 ENCOUNTER — Encounter: Payer: Medicaid Other | Admitting: Family Medicine

## 2023-04-02 ENCOUNTER — Encounter: Payer: Self-pay | Admitting: Family Medicine

## 2023-04-02 VITALS — BP 144/83 | HR 58 | Temp 98.4°F | Resp 18 | Ht 66.0 in | Wt 196.0 lb

## 2023-04-02 DIAGNOSIS — S20211D Contusion of right front wall of thorax, subsequent encounter: Secondary | ICD-10-CM

## 2023-04-02 DIAGNOSIS — D869 Sarcoidosis, unspecified: Secondary | ICD-10-CM

## 2023-04-02 DIAGNOSIS — J4540 Moderate persistent asthma, uncomplicated: Secondary | ICD-10-CM

## 2023-04-02 DIAGNOSIS — I10 Essential (primary) hypertension: Secondary | ICD-10-CM

## 2023-04-02 DIAGNOSIS — Z0000 Encounter for general adult medical examination without abnormal findings: Secondary | ICD-10-CM | POA: Diagnosis not present

## 2023-04-02 DIAGNOSIS — E66811 Obesity, class 1: Secondary | ICD-10-CM | POA: Diagnosis not present

## 2023-04-02 DIAGNOSIS — Z6831 Body mass index (BMI) 31.0-31.9, adult: Secondary | ICD-10-CM

## 2023-04-02 DIAGNOSIS — E6609 Other obesity due to excess calories: Secondary | ICD-10-CM

## 2023-04-02 DIAGNOSIS — M7021 Olecranon bursitis, right elbow: Secondary | ICD-10-CM

## 2023-04-02 MED ORDER — METHOCARBAMOL 500 MG PO TABS: 500.0000 mg | ORAL_TABLET | Freq: Two times a day (BID) | ORAL | 2 refills | Status: DC | PRN

## 2023-04-02 MED ORDER — TRAMADOL HCL 50 MG PO TABS: 50.0000 mg | ORAL_TABLET | Freq: Two times a day (BID) | ORAL | 0 refills | Status: DC | PRN

## 2023-04-02 MED ORDER — BUDESONIDE-FORMOTEROL FUMARATE 160-4.5 MCG/ACT IN AERO: 2.0000 | INHALATION_SPRAY | Freq: Two times a day (BID) | RESPIRATORY_TRACT | 6 refills | Status: DC

## 2023-04-02 MED ORDER — ALBUTEROL SULFATE HFA 108 (90 BASE) MCG/ACT IN AERS: 2.0000 | INHALATION_SPRAY | Freq: Four times a day (QID) | RESPIRATORY_TRACT | 2 refills | Status: DC | PRN

## 2023-04-02 NOTE — Patient Instructions (Addendum)
It was nice to see you today,  We addressed the following topics today: -I have refilled the Robaxin and your inhalers. - We will get some blood test and follow-up with his also with you when we get them - I will send in a short-term course of the tramadol. - If your elbow swelling returns you can use a compression sleeve to help with the swelling.  It is likely something called olecranon bursitis.  If you feel like you have concern for it being infected you can have [place] look at it.    Have a great day,  Frederic Jericho, MD

## 2023-04-02 NOTE — Progress Notes (Unsigned)
   Annual physical  Subjective   Patient ID: Shawn Raymond, male    DOB: 1980-11-22  Age: 42 y.o. MRN: [id number]  Chief Complaint  Patient presents with   Annual Exam   HPI Shawn Raymond is a 42 y.o. old male here  for annual exam.   Changes in his/her health in the last 12 months: {yes/no:43}  The patient currently works as anot work.  [employer].  Shawn Raymond not work***.  He is divorced.  18 and 12.  married/single/in a relationship and recently/not recently sexually active with ***male partner.  He doesno use tobacco***, drinks no days per***, does nouse recreational drugs.  The patient eats ano red meat or pork.  And dairy.  diet.  He exercises yoga daily and walking a mile at least a times per week doing***.  The patient does***have an advanced directive.  The patient declines/endorses concerns for impaired hearing.  The patient declines/endorses concerns for impaired vision.  The patient does/does not see a dentist regularly.  The patient does***have a family history of prostate cancer.  The patient does***have a family history of colon cancer.  The patient has the following chronic health issues that are monitored on a routine basis: ***  Rib pain from falling on the bathtub. - a lot better.    Reflux daiy  Triamciniolone needs refill.   4 days ago. Right elbow - couldn't straighten it.  Hot.  Swollen.  Doing exercise.    Shawn Raymond twice a day - since 2019.  Daily.    Uses symbciot bid.  Uses the albuterol too much.    GFR was normal after adding Cystatin C  HPI  Separate, acute concerns today: ***  The 10-year ASCVD risk score (Arnett DK, et al., 2019) is: 5.5%  Health Maintenance Due  Topic Date Due   Hepatitis C Screening  Never done   COVID-19 Vaccine (1 - 2024-25 season) Never done      Objective:     BP (!) 155/95 (BP Location: Left Arm, Patient Position: Sitting, Cuff Size: Large)   Pulse (!) 54   Temp 98.4 F (36.9 C) (Oral)   Resp 18   Ht 5\' 6"  (1.676 m)    Wt 196 lb (88.9 kg)   SpO2 100%   BMI 31.64 kg/m  {Vitals History (Optional):23777}  Physical Exam   No results found for any visits on 04/02/23.      Assessment & Plan:   Moderate persistent asthma without complication -     Albuterol Sulfate HFA; Inhale 2 puffs into the lungs every 6 (six) hours as needed for wheezing or shortness of breath.  Dispense: 18 g; Refill: 2 -     Budesonide-Formoterol Fumarate; Inhale 2 puffs into the lungs 2 (two) times daily.  Dispense: 10.2 g; Refill: 6     No follow-ups on file.    Sandre Kitty, MD

## 2023-04-03 LAB — CBC WITH DIFFERENTIAL/PLATELET
Basophils Absolute: 0.1 10*3/uL (ref 0.0–0.2)
Basos: 1 %
EOS (ABSOLUTE): 0.2 10*3/uL (ref 0.0–0.4)
Eos: 3 %
Hematocrit: 43.2 % (ref 37.5–51.0)
Hemoglobin: 14.4 g/dL (ref 13.0–17.7)
Immature Grans (Abs): 0 10*3/uL (ref 0.0–0.1)
Immature Granulocytes: 0 %
Lymphocytes Absolute: 1.9 10*3/uL (ref 0.7–3.1)
Lymphs: 21 %
MCH: 30.1 pg (ref 26.6–33.0)
MCHC: 33.3 g/dL (ref 31.5–35.7)
MCV: 90 fL (ref 79–97)
Monocytes Absolute: 0.5 10*3/uL (ref 0.1–0.9)
Monocytes: 6 %
Neutrophils Absolute: 6.2 10*3/uL (ref 1.4–7.0)
Neutrophils: 69 %
Platelets: 278 10*3/uL (ref 150–450)
RBC: 4.78 x10E6/uL (ref 4.14–5.80)
RDW: 12.7 % (ref 11.6–15.4)
WBC: 8.9 10*3/uL (ref 3.4–10.8)

## 2023-04-03 LAB — COMPREHENSIVE METABOLIC PANEL
ALT: 27 [IU]/L (ref 0–44)
AST: 18 [IU]/L (ref 0–40)
Albumin: 4 g/dL — ABNORMAL LOW (ref 4.1–5.1)
Alkaline Phosphatase: 123 [IU]/L — ABNORMAL HIGH (ref 44–121)
BUN/Creatinine Ratio: 11 (ref 9–20)
BUN: 13 mg/dL (ref 6–24)
Bilirubin Total: <0.2 mg/dL (ref 0.0–1.2)
CO2: 22 mmol/L (ref 20–29)
Calcium: 9.4 mg/dL (ref 8.7–10.2)
Chloride: 102 mmol/L (ref 96–106)
Creatinine, Ser: 1.17 mg/dL (ref 0.76–1.27)
Globulin, Total: 3.2 g/dL (ref 1.5–4.5)
Glucose: 88 mg/dL (ref 70–99)
Potassium: 4.4 mmol/L (ref 3.5–5.2)
Sodium: 139 mmol/L (ref 134–144)
Total Protein: 7.2 g/dL (ref 6.0–8.5)
eGFR: 79 mL/min/{1.73_m2} (ref >59)

## 2023-04-03 LAB — TSH: TSH: 1.44 u[IU]/mL (ref 0.450–4.500)

## 2023-04-03 LAB — LIPID PANEL
Chol/HDL Ratio: 5.8 {ratio} — ABNORMAL HIGH (ref 0.0–5.0)
Cholesterol, Total: 215 mg/dL — ABNORMAL HIGH (ref 100–199)
HDL: 37 mg/dL — ABNORMAL LOW (ref >39)
LDL Chol Calc (NIH): 151 mg/dL — ABNORMAL HIGH (ref 0–99)
Triglycerides: 149 mg/dL (ref 0–149)
VLDL Cholesterol Cal: 27 mg/dL (ref 5–40)

## 2023-04-03 LAB — HEMOGLOBIN A1C
Est. average glucose Bld gHb Est-mCnc: 120 mg/dL
Hgb A1c MFr Bld: 5.8 % — ABNORMAL HIGH (ref 4.8–5.6)

## 2023-04-04 DIAGNOSIS — I10 Essential (primary) hypertension: Secondary | ICD-10-CM | POA: Insufficient documentation

## 2023-04-04 DIAGNOSIS — M7021 Olecranon bursitis, right elbow: Secondary | ICD-10-CM | POA: Insufficient documentation

## 2023-04-04 NOTE — Assessment & Plan Note (Signed)
Patient's description of recent swelling was consistent with olecranon bursitis.  On exam today no swelling or evidence of infection.  Advised patient to wear compression sleeve if this recurs.

## 2023-04-04 NOTE — Assessment & Plan Note (Signed)
Appears to be healed.  Pain is minimal.

## 2023-04-04 NOTE — Assessment & Plan Note (Signed)
During chart review after the patient encounter I came across his diagnosis of pulmonary sarcoidosis in 2010 seen on lung CT and confirmed with biopsy.  Patient had initial visit with pulmonology and does not appear to have ever followed up with them.  Does not appear that this has ever been readdressed.  Continues to use Symbicort and albuterol since that time.  At next visit will need to discuss with patient continued follow-up of this.  Probably needs at least a repeat CT scan and spirometry.  May need to see pulmonology again.  He endorses compliance with Symbicort but still endorses having to use his rescue inhaler daily.

## 2023-04-04 NOTE — Assessment & Plan Note (Signed)
Not on any blood pressure medications.  Elevated today.  Advised patient to check blood pressure at home.  Reassess BP at next visit and if still elevated discuss medications.

## 2023-04-05 ENCOUNTER — Other Ambulatory Visit: Payer: Self-pay | Admitting: Nurse Practitioner

## 2023-04-05 DIAGNOSIS — L301 Dyshidrosis [pompholyx]: Secondary | ICD-10-CM

## 2023-04-06 ENCOUNTER — Other Ambulatory Visit: Payer: Self-pay

## 2023-04-06 MED ORDER — TACROLIMUS 0.1 % EX OINT: TOPICAL_OINTMENT | Freq: Two times a day (BID) | CUTANEOUS | 0 refills | Status: DC

## 2023-04-07 ENCOUNTER — Other Ambulatory Visit: Payer: Self-pay | Admitting: Family Medicine

## 2023-04-07 DIAGNOSIS — L301 Dyshidrosis [pompholyx]: Secondary | ICD-10-CM

## 2023-04-09 MED ORDER — TACROLIMUS 0.1 % EX OINT: TOPICAL_OINTMENT | Freq: Two times a day (BID) | CUTANEOUS | 0 refills | Status: DC

## 2023-04-14 ENCOUNTER — Other Ambulatory Visit (INDEPENDENT_AMBULATORY_CARE_PROVIDER_SITE_OTHER): Payer: Self-pay | Admitting: Primary Care

## 2023-04-14 DIAGNOSIS — R12 Heartburn: Secondary | ICD-10-CM

## 2023-04-14 DIAGNOSIS — Z760 Encounter for issue of repeat prescription: Secondary | ICD-10-CM

## 2023-04-16 ENCOUNTER — Other Ambulatory Visit: Payer: Self-pay | Admitting: Family Medicine

## 2023-04-16 ENCOUNTER — Other Ambulatory Visit: Payer: Self-pay

## 2023-04-16 MED ORDER — TRAMADOL HCL 50 MG PO TABS: 50.0000 mg | ORAL_TABLET | Freq: Every day | ORAL | 0 refills | Status: AC | PRN

## 2023-04-16 MED ORDER — OMEPRAZOLE 40 MG PO CPDR
40.0000 mg | DELAYED_RELEASE_CAPSULE | Freq: Every day | ORAL | 0 refills | Status: DC
  Filled 2023-04-16: qty 90, 90d supply, fill #0

## 2023-04-17 ENCOUNTER — Other Ambulatory Visit: Payer: Self-pay

## 2023-04-19 ENCOUNTER — Other Ambulatory Visit: Payer: Self-pay | Admitting: Family Medicine

## 2023-05-25 ENCOUNTER — Other Ambulatory Visit: Payer: Self-pay

## 2023-05-25 ENCOUNTER — Encounter: Payer: Self-pay | Admitting: Family Medicine

## 2023-05-25 ENCOUNTER — Other Ambulatory Visit (INDEPENDENT_AMBULATORY_CARE_PROVIDER_SITE_OTHER): Payer: Self-pay | Admitting: Primary Care

## 2023-05-25 ENCOUNTER — Other Ambulatory Visit: Payer: Self-pay | Admitting: Family Medicine

## 2023-05-25 DIAGNOSIS — R12 Heartburn: Secondary | ICD-10-CM

## 2023-05-25 DIAGNOSIS — Z760 Encounter for issue of repeat prescription: Secondary | ICD-10-CM

## 2023-05-25 DIAGNOSIS — L301 Dyshidrosis [pompholyx]: Secondary | ICD-10-CM

## 2023-05-25 MED ORDER — TACROLIMUS 0.1 % EX OINT: TOPICAL_OINTMENT | Freq: Two times a day (BID) | CUTANEOUS | 2 refills | Status: DC

## 2023-05-25 MED ORDER — OMEPRAZOLE 40 MG PO CPDR
40.0000 mg | DELAYED_RELEASE_CAPSULE | Freq: Every day | ORAL | 1 refills | Status: DC
  Filled 2023-05-25: qty 90, 90d supply, fill #0

## 2023-05-25 MED ORDER — METHOCARBAMOL 500 MG PO TABS
500.0000 mg | ORAL_TABLET | Freq: Two times a day (BID) | ORAL | 2 refills | Status: DC | PRN
  Filled 2023-05-25: qty 60, 30d supply, fill #0

## 2023-06-06 ENCOUNTER — Other Ambulatory Visit: Payer: Self-pay

## 2023-07-02 ENCOUNTER — Ambulatory Visit (INDEPENDENT_AMBULATORY_CARE_PROVIDER_SITE_OTHER): Payer: Medicaid Other | Admitting: Family Medicine

## 2023-07-02 ENCOUNTER — Encounter: Payer: Self-pay | Admitting: Family Medicine

## 2023-07-02 VITALS — BP 122/82 | HR 74 | Ht 66.0 in | Wt 198.4 lb

## 2023-07-02 DIAGNOSIS — J4540 Moderate persistent asthma, uncomplicated: Secondary | ICD-10-CM | POA: Diagnosis not present

## 2023-07-02 DIAGNOSIS — M109 Gout, unspecified: Secondary | ICD-10-CM | POA: Diagnosis not present

## 2023-07-02 DIAGNOSIS — R12 Heartburn: Secondary | ICD-10-CM | POA: Diagnosis not present

## 2023-07-02 DIAGNOSIS — Z760 Encounter for issue of repeat prescription: Secondary | ICD-10-CM

## 2023-07-02 DIAGNOSIS — D869 Sarcoidosis, unspecified: Secondary | ICD-10-CM

## 2023-07-02 MED ORDER — BUDESONIDE-FORMOTEROL FUMARATE 160-4.5 MCG/ACT IN AERO: 2.0000 | INHALATION_SPRAY | Freq: Two times a day (BID) | RESPIRATORY_TRACT | 6 refills | Status: DC

## 2023-07-02 MED ORDER — COLCHICINE 0.6 MG PO TABS: ORAL_TABLET | ORAL | 0 refills | Status: DC

## 2023-07-02 MED ORDER — OMEPRAZOLE 40 MG PO CPDR: 40.0000 mg | DELAYED_RELEASE_CAPSULE | Freq: Every day | ORAL | 1 refills | Status: DC

## 2023-07-02 NOTE — Patient Instructions (Addendum)
 It was nice to see you today,  We addressed the following topics today: -I believe your right ankle swelling is due to gout.  I would like to check your uric acid levels.  Please make an appointment to get your labs.  I will send in colchicine for you to take for the gout flare.  When you take this medicine do not take the Toradol, ibuprofen, Aleve.  You can take acetaminophen. - I am going to send in a referral to the pulmonologist and order a CT scan of your chest.  Someone call you to schedule the CT scan and the pulmonology appointment separately. - I am sending in your prescription refills that you need.  Have a great day,  Frederic Jericho, MD

## 2023-07-02 NOTE — Progress Notes (Unsigned)
 Established Patient Office Visit  Subjective   Patient ID: Shawn Raymond, male    DOB: 1980-10-24  Age: 43 y.o. MRN: [id number]  Chief Complaint  Patient presents with   Medical Management of Chronic Issues    HPI  Back pain-patient's back pain is slightly worse but he attributes that to traveling more in his car to visit his father who is in the ICU at the [place] in [place]  Patient states that his right ankle suddenly developed pain when he woke up in the morning and took his first step about 1 week ago.  It has been swollen.  He has been taking ibuprofen and extra strength Excedrin for this.  Continues to hurt, but was able to ice it this weekend.  No family history of personal history of gout.  Respiratory-patient tells taking Symbicort and albuterol.  Some days goes without taking albuterol but some days needs to use it multiple times.  We talked about the mention of sarcoidosis many years ago and the patient endorsed that he was diagnosed with sarcoidosis in the past but does not follow-up with a pulmonologist.  States he would like to.  He is also agreeable to CT scan of the chest.  GERD-patient needs refill of his omeprazole in addition to the albuterol.    The 10-year ASCVD risk score (Arnett DK, et al., 2019) is: 3.8%  Health Maintenance Due  Topic Date Due   Pneumococcal Vaccine 55-73 Years old (1 of 2 - PCV) Never done   Hepatitis C Screening  Never done   COVID-19 Vaccine (1 - 2024-25 season) Never done      Objective:     BP 122/82   Pulse 74   Ht 5\' 6"  (1.676 m)   Wt 198 lb 6.4 oz (90 kg)   SpO2 99%   BMI 32.02 kg/m    Physical Exam General: Alert, oriented Pulmonary: No respiratory distress Extremities: Right ankle swollen in the medial and lateral side.  No swelling or tenderness of the joints of the toes.  No rash.  Warmth is minimal.   No results found for any visits on 07/02/23.      Assessment & Plan:   Acute gout of right ankle,  unspecified cause Assessment & Plan: Symptoms and exam most consistent with gout.  Has never had a gout flare before.  Will get uric acid level.  Prescribed colchicine.  After acute flare can discuss preventative medications if needed.  Orders: -     Uric acid; Future  Moderate persistent asthma without complication -     Budesonide-Formoterol Fumarate; Inhale 2 puffs into the lungs 2 (two) times daily.  Dispense: 10.2 g; Refill: 6  Refill clinic medication management patient -     Omeprazole; Take 1 capsule (40 mg total) by mouth daily.  Dispense: 90 capsule; Refill: 1  Heartburn -     Omeprazole; Take 1 capsule (40 mg total) by mouth daily.  Dispense: 90 capsule; Refill: 1  PULMONARY SARCOIDOSIS Assessment & Plan: Patient respiratory symptoms still inadequately controlled with Symbicort and has to use rescue inhaler several days per week, sometimes multiple times per day.  Patient is agreeable to repeat CT scan and referral to pulmonology.  Orders: -     Ambulatory referral to Pulmonology  Other orders -     Colchicine; Take 2 tabs (1.2 mg) by mouth, then 0.6 mg 1 hr later on first day. Starting day 2, take 1 tablet (0.6 mg total) by  mouth once or twice daily until gout flare resolves.  Dispense: 30 tablet; Refill: 0     Return in about 3 months (around 10/02/2023) for hld, pulm.    Sandre Kitty, MD

## 2023-07-04 DIAGNOSIS — M109 Gout, unspecified: Secondary | ICD-10-CM | POA: Insufficient documentation

## 2023-07-04 NOTE — Assessment & Plan Note (Signed)
 Symptoms and exam most consistent with gout.  Has never had a gout flare before.  Will get uric acid level.  Prescribed colchicine.  After acute flare can discuss preventative medications if needed.

## 2023-07-04 NOTE — Assessment & Plan Note (Signed)
 Patient respiratory symptoms still inadequately controlled with Symbicort and has to use rescue inhaler several days per week, sometimes multiple times per day.  Patient is agreeable to repeat CT scan and referral to pulmonology.

## 2023-07-09 ENCOUNTER — Other Ambulatory Visit

## 2023-07-09 DIAGNOSIS — M109 Gout, unspecified: Secondary | ICD-10-CM

## 2023-07-10 ENCOUNTER — Encounter: Payer: Self-pay | Admitting: Family Medicine

## 2023-07-10 LAB — URIC ACID: Uric Acid: 9.3 mg/dL — ABNORMAL HIGH (ref 3.8–8.4)

## 2023-07-19 ENCOUNTER — Other Ambulatory Visit: Payer: Self-pay | Admitting: Family Medicine

## 2023-07-19 DIAGNOSIS — J4540 Moderate persistent asthma, uncomplicated: Secondary | ICD-10-CM

## 2023-08-28 ENCOUNTER — Encounter: Payer: Self-pay | Admitting: Family Medicine

## 2023-08-29 ENCOUNTER — Encounter: Payer: Self-pay | Admitting: Pulmonary Disease

## 2023-08-29 ENCOUNTER — Other Ambulatory Visit: Payer: Self-pay | Admitting: Family Medicine

## 2023-08-29 ENCOUNTER — Ambulatory Visit: Admitting: Pulmonary Disease

## 2023-08-29 VITALS — BP 138/61 | HR 59 | Ht 66.0 in | Wt 191.8 lb

## 2023-08-29 DIAGNOSIS — Z87891 Personal history of nicotine dependence: Secondary | ICD-10-CM

## 2023-08-29 DIAGNOSIS — J4540 Moderate persistent asthma, uncomplicated: Secondary | ICD-10-CM

## 2023-08-29 DIAGNOSIS — D869 Sarcoidosis, unspecified: Secondary | ICD-10-CM | POA: Diagnosis not present

## 2023-08-29 MED ORDER — BUDESONIDE-FORMOTEROL FUMARATE 160-4.5 MCG/ACT IN AERO: 2.0000 | INHALATION_SPRAY | Freq: Two times a day (BID) | RESPIRATORY_TRACT | 11 refills | Status: DC

## 2023-08-29 MED ORDER — TRAMADOL HCL 50 MG PO TABS: 50.0000 mg | ORAL_TABLET | Freq: Three times a day (TID) | ORAL | 0 refills | Status: AC | PRN

## 2023-08-29 MED ORDER — ALBUTEROL SULFATE HFA 108 (90 BASE) MCG/ACT IN AERS: 1.0000 | INHALATION_SPRAY | RESPIRATORY_TRACT | 11 refills | Status: DC | PRN

## 2023-08-29 NOTE — Patient Instructions (Addendum)
 Continue symbicort  160-4.2mcg 2 puffs twice daily - rinse mouth out after each use  Use albuterol  inhaler 1-2 puffs every 4-6 hours as needed  We will schedule you for pulmonary function tests at the front desk  Follow up in 4 months

## 2023-08-29 NOTE — Progress Notes (Signed)
 Synopsis: Referred in May 2025 for Sarcoidosis  Subjective:   PATIENT ID: Shawn Raymond GENDER: male DOB: [birth year], MRN: [id number]  HPI  Chief Complaint  Patient presents with   Consult    Pt states PCP , asthma / sarcoidosis    Shawn Raymond is a 43 year old male, former smoker with history of asthma and sarcoidosis who is referred to pulmonary clinic.   He was diagnosed with pulmonary sarcoid in 2010 via transbronchial biopsy. He does not remember his treatment regimen from initial diagnosis, steroids vs methotrexate. He reports no issues regarding sarcoidosis since his initial diagnosis. He does have asthma and is using symbicort  160-4.27mcg 2 puffs twice daily and as needed albuterol . He reports season changes tend to bother his breathing most. Last asthma exacerbation was 09/2022 where he was treated with steroids.   He is doing yoga mainly for his back issues and spinal stenosis. He is on disability from a neck injury where he required surgery. He quit smoking in 2019. He is a Arts development officer.   Past Medical History:  Diagnosis Date   Asthma    Sarcoidosis      Family History  Problem Relation Age of Onset   Healthy Mother    Healthy Father      Social History   Socioeconomic History   Marital status: Divorced    Spouse name: Not on file   Number of children: Not on file   Years of education: Not on file   Highest education level: Some college, no degree  Occupational History   Not on file  Tobacco Use   Smoking status: Former    Current packs/day: 0.00    Types: Cigarettes    Quit date: 12/24/2017    Years since quitting: 5.6   Smokeless tobacco: Never   Tobacco comments:    patient has been  without a cigarette since 12/27/17  Vaping Use   Vaping status: Never Used  Substance and Sexual Activity   Alcohol use: No   Drug use: Yes    Types: Marijuana    Comment: 3grams a day.    Sexual activity: Not on file  Other Topics Concern   Not on file  Social  History Narrative   Not on file   Social Drivers of Health   Financial Resource Strain: Patient Declined (01/25/2023)   Overall Financial Resource Strain (CARDIA)    Difficulty of Paying Living Expenses: Patient declined  Food Insecurity: Patient Declined (01/25/2023)   Hunger Vital Sign    Worried About Running Out of Food in the Last Year: Patient declined    Ran Out of Food in the Last Year: Patient declined  Transportation Needs: No Transportation Needs (01/25/2023)   PRAPARE - Administrator, Civil Service (Medical): No    Lack of Transportation (Non-Medical): No  Physical Activity: Insufficiently Active (01/25/2023)   Exercise Vital Sign    Days of Exercise per Week: 5 days    Minutes of Exercise per Session: 20 min  Stress: Patient Declined (01/25/2023)   [employer] of Occupational Health - Occupational Stress Questionnaire    Feeling of Stress : Patient declined  Recent Concern: Stress - Stress Concern Present (01/15/2023)   [employer] of Occupational Health - Occupational Stress Questionnaire    Feeling of Stress : Very much  Social Connections: Unknown (01/25/2023)   Social Connection and Isolation Panel [NHANES]    Frequency of Communication with Friends and Family: Patient declined  Frequency of Social Gatherings with Friends and Family: Patient declined    Attends Religious Services: Patient declined    Database administrator or Organizations: Patient declined    Attends Engineer, structural: 1 to 4 times per year    Marital Status: Divorced  Recent Concern: Social Connections - Moderately Isolated (01/15/2023)   Social Connection and Isolation Panel [NHANES]    Frequency of Communication with Friends and Family: More than three times a week    Frequency of Social Gatherings with Friends and Family: Not on file    Attends Religious Services: Never    Database administrator or Organizations: Yes    Attends Banker  Meetings: 1 to 4 times per year    Marital Status: Divorced  Catering manager Violence: Not on file     Allergies  Allergen Reactions   Iodine Anaphylaxis   Shellfish Allergy Anaphylaxis     Outpatient Medications Prior to Visit  Medication Sig Dispense Refill   diclofenac  Sodium (VOLTAREN  ARTHRITIS PAIN) 1 % GEL Apply 2 g topically 4 (four) times daily. 50 g 2   lidocaine  (HM LIDOCAINE  PATCH) 4 % Place 1 patch onto the skin daily. 15 patch 1   methocarbamol  (ROBAXIN ) 500 MG tablet Take 1 tablet (500 mg total) by mouth 2 (two) times daily as needed for muscle spasms. 60 tablet 2   omeprazole  (PRILOSEC) 40 MG capsule Take 1 capsule (40 mg total) by mouth daily. 90 capsule 1   tacrolimus  (PROTOPIC ) 0.1 % ointment Apply topically 2 (two) times daily. 100 g 2   traMADol  (ULTRAM ) 50 MG tablet Take 1 tablet (50 mg total) by mouth every 8 (eight) hours as needed for up to 5 days. 15 tablet 0   triamcinolone  ointment (KENALOG ) 0.5 % Apply 1 Application topically 2 (two) times daily. Apply to palm and wrist for two weeks. 60 g 1   budesonide -formoterol  (SYMBICORT ) 160-4.5 MCG/ACT inhaler Inhale 2 puffs into the lungs 2 (two) times daily. 10.2 g 6   fluconazole  (DIFLUCAN ) 150 MG tablet Take 1 tablet po once. May repeat dose in 3 days as needed for persistent symptoms. 3 tablet 0   ketorolac  (TORADOL ) 10 MG tablet Take 1 tablet (10 mg total) by mouth every 6 (six) hours as needed. 30 tablet 0   nystatin  (MYCOSTATIN ) 100000 UNIT/ML suspension Take 5 mLs (500,000 Units total) by mouth 4 (four) times daily. 200 mL 1   oxybutynin  (DITROPAN -XL) 10 MG 24 hr tablet Take 1 tablet (10 mg total) by mouth daily as directed. 30 tablet 11   predniSONE  (STERAPRED UNI-PAK 21 TAB) 10 MG (21) TBPK tablet 6 day taper - take by mouth as directed for 6 days 21 tablet 0   VENTOLIN  HFA 108 (90 Base) MCG/ACT inhaler TAKE 2 PUFFS BY MOUTH EVERY 6 HOURS AS NEEDED FOR WHEEZE OR SHORTNESS OF BREATH 18 each 2   colchicine  0.6  MG tablet Take 2 tabs (1.2 mg) by mouth, then 0.6 mg 1 hr later on first day. Starting day 2, take 1 tablet (0.6 mg total) by mouth once or twice daily until gout flare resolves. 30 tablet 0   No facility-administered medications prior to visit.    Review of Systems  Constitutional:  Negative for chills, fever, malaise/fatigue and weight loss.  HENT:  Negative for congestion, sinus pain and sore throat.   Eyes: Negative.   Respiratory:  Negative for cough, hemoptysis, sputum production, shortness of breath and wheezing.  Cardiovascular:  Negative for chest pain, palpitations, orthopnea, claudication and leg swelling.  Gastrointestinal:  Negative for abdominal pain, heartburn, nausea and vomiting.  Genitourinary: Negative.   Musculoskeletal:  Positive for back pain. Negative for joint pain and myalgias.  Skin:  Negative for rash.  Neurological:  Negative for weakness.  Endo/Heme/Allergies: Negative.   Psychiatric/Behavioral: Negative.      Objective:   Vitals:   08/29/23 0857  BP: 138/61  Pulse: (!) 59  SpO2: 99%  Weight: 191 lb 12.8 oz (87 kg)  Height: 5\' 6"  (1.676 m)     Physical Exam Constitutional:      General: He is not in acute distress.    Appearance: Normal appearance.  Eyes:     General: No scleral icterus.    Conjunctiva/sclera: Conjunctivae normal.  Cardiovascular:     Rate and Rhythm: Normal rate and regular rhythm.  Pulmonary:     Breath sounds: No wheezing, rhonchi or rales.  Musculoskeletal:     Right lower leg: No edema.     Left lower leg: No edema.  Skin:    General: Skin is warm and dry.  Neurological:     General: No focal deficit present.       CBC    Component Value Date/Time   WBC 8.9 04/02/2023 1103   WBC 13.4 (H) 04/04/2019 0839   RBC 4.78 04/02/2023 1103   RBC 4.80 04/04/2019 0839   HGB 14.4 04/02/2023 1103   HCT 43.2 04/02/2023 1103   PLT 278 04/02/2023 1103   MCV 90 04/02/2023 1103   MCH 30.1 04/02/2023 1103   MCH 29.0  04/04/2019 0839   MCHC 33.3 04/02/2023 1103   MCHC 32.3 04/04/2019 0839   RDW 12.7 04/02/2023 1103   LYMPHSABS 1.9 04/02/2023 1103   MONOABS 0.7 04/04/2019 0839   EOSABS 0.2 04/02/2023 1103   BASOSABS 0.1 04/02/2023 1103     Chest imaging: CXR 04/04/2019 Lungs are clear. Heart size and pulmonary vascularity are normal. No adenopathy. Postoperative changes noted in the lower cervical spine region.  PFT:     No data to display          Labs:  Path:  Echo:  Heart Catheterization:       Assessment & Plan:   Moderate persistent asthma without complication - Plan: Pulmonary Function Test, budesonide -formoterol  (SYMBICORT ) 160-4.5 MCG/ACT inhaler, albuterol  (VENTOLIN  HFA) 108 (90 Base) MCG/ACT inhaler  Sarcoidosis  Discussion: Shawn Raymond is a 43 year old male, former smoker with history of asthma and sarcoidosis who is referred to pulmonary clinic.   Asthma Asthma managed with Symbicort  and Ventolin . Symptoms worsen with weather changes. No prednisone  since June 2024. Ventolin  use increases during weather changes.  - Order pulmonary function tests. - Refills sent for symbicort  and albuterol /ventolin  - Advise placing Symbicort  by toothbrush to prompt mouth rinsing post-use.  Hx of Sarcoidosis Diagnosed in 2010 via transbronchial biopsy - no active symptoms at this time - check PFTs, if significant abnormalities will consider CT Chest  Schedule follow-up in four months.  Duaine German, MD [facility] Office: [phone]   Current Outpatient Medications:    diclofenac  Sodium (VOLTAREN  ARTHRITIS PAIN) 1 % GEL, Apply 2 g topically 4 (four) times daily., Disp: 50 g, Rfl: 2   lidocaine  (HM LIDOCAINE  PATCH) 4 %, Place 1 patch onto the skin daily., Disp: 15 patch, Rfl: 1   methocarbamol  (ROBAXIN ) 500 MG tablet, Take 1 tablet (500 mg total) by mouth 2 (two) times daily  as needed for muscle spasms., Disp: 60 tablet, Rfl: 2   omeprazole   (PRILOSEC) 40 MG capsule, Take 1 capsule (40 mg total) by mouth daily., Disp: 90 capsule, Rfl: 1   tacrolimus  (PROTOPIC ) 0.1 % ointment, Apply topically 2 (two) times daily., Disp: 100 g, Rfl: 2   traMADol  (ULTRAM ) 50 MG tablet, Take 1 tablet (50 mg total) by mouth every 8 (eight) hours as needed for up to 5 days., Disp: 15 tablet, Rfl: 0   triamcinolone  ointment (KENALOG ) 0.5 %, Apply 1 Application topically 2 (two) times daily. Apply to palm and wrist for two weeks., Disp: 60 g, Rfl: 1   albuterol  (VENTOLIN  HFA) 108 (90 Base) MCG/ACT inhaler, Inhale 1-2 puffs into the lungs every 4 (four) hours as needed for wheezing or shortness of breath., Disp: 18 each, Rfl: 11   budesonide -formoterol  (SYMBICORT ) 160-4.5 MCG/ACT inhaler, Inhale 2 puffs into the lungs 2 (two) times daily., Disp: 10.2 g, Rfl: 11

## 2023-08-30 ENCOUNTER — Other Ambulatory Visit: Payer: Self-pay | Admitting: Family Medicine

## 2023-08-30 DIAGNOSIS — M47814 Spondylosis without myelopathy or radiculopathy, thoracic region: Secondary | ICD-10-CM

## 2023-08-30 DIAGNOSIS — Z981 Arthrodesis status: Secondary | ICD-10-CM

## 2023-10-02 ENCOUNTER — Ambulatory Visit (INDEPENDENT_AMBULATORY_CARE_PROVIDER_SITE_OTHER): Admitting: Family Medicine

## 2023-10-02 VITALS — BP 136/82 | HR 53 | Ht 66.0 in | Wt 190.4 lb

## 2023-10-02 DIAGNOSIS — R079 Chest pain, unspecified: Secondary | ICD-10-CM | POA: Diagnosis not present

## 2023-10-02 DIAGNOSIS — G8929 Other chronic pain: Secondary | ICD-10-CM

## 2023-10-02 DIAGNOSIS — M479 Spondylosis, unspecified: Secondary | ICD-10-CM | POA: Diagnosis not present

## 2023-10-02 DIAGNOSIS — M545 Low back pain, unspecified: Secondary | ICD-10-CM

## 2023-10-02 DIAGNOSIS — M5010 Cervical disc disorder with radiculopathy, unspecified cervical region: Secondary | ICD-10-CM | POA: Diagnosis not present

## 2023-10-02 MED ORDER — TRAMADOL HCL 50 MG PO TABS: 50.0000 mg | ORAL_TABLET | Freq: Three times a day (TID) | ORAL | 0 refills | Status: AC | PRN

## 2023-10-02 NOTE — Progress Notes (Unsigned)
   Established Patient Office Visit  Subjective   Patient ID: Shawn Raymond, male    DOB: 1981/01/15  Age: 43 y.o. MRN: [id number]  No chief complaint on file.   HPI  neurosurgery -   Tramadol  - once   Sweating - lightheaded, sweating.  No sob.    40s    New onset chest pain.  Episodes.  One episodes with sweating.  Sat down.  With walking.  Other times rnadomly. Father mi in 33s.  Smokes marijuana daily.     The 10-year ASCVD risk score (Arnett DK, et al., 2019) is: 5.4%  Health Maintenance Due  Topic Date Due   HPV VACCINES (1 - Male 3-dose series) Never done   Hepatitis C Screening  Never done   Pneumococcal Vaccine 90-27 Years old (1 of 2 - PCV) Never done   COVID-19 Vaccine (1 - 2024-25 season) Never done      Objective:     There were no vitals taken for this visit. {Vitals History (Optional):23777}  Physical Exam   No results found for any visits on 10/02/23.      Assessment & Plan:   There are no diagnoses linked to this encounter.   No follow-ups on file.    Toribio MARLA Slain, MD

## 2023-10-02 NOTE — Patient Instructions (Signed)
 It was nice to see you today,  We addressed the following topics today: -Your EKG was mostly similar to your EKG from 2020.  I will let you know the results of your lab testing when I get them.  I am sending a referral to the cardiologist.  If you have not heard from somebody in the next week please let me know. - If you have any more episodes of chest pain that are severe, are associated with nausea, sweating, shortness of breath, pain radiating down the arm or neck then have someone take you to the emergency department for evaluation. - I am sending in a referral to the neurosurgeon and I have refilled your tramadol .  Have a great day,  Etha Henle, MD

## 2023-10-04 LAB — LIPID PANEL
Chol/HDL Ratio: 5.4 ratio — ABNORMAL HIGH (ref 0.0–5.0)
Cholesterol, Total: 166 mg/dL (ref 100–199)
HDL: 31 mg/dL — ABNORMAL LOW (ref >39)
LDL Chol Calc (NIH): 94 mg/dL (ref 0–99)
Triglycerides: 244 mg/dL — ABNORMAL HIGH (ref 0–149)
VLDL Cholesterol Cal: 41 mg/dL — ABNORMAL HIGH (ref 5–40)

## 2023-10-04 LAB — APOLIPOPROTEIN B: Apolipoprotein B: 105 mg/dL — ABNORMAL HIGH (ref <90)

## 2023-10-04 LAB — LIPOPROTEIN A (LPA): Lipoprotein (a): 34.7 nmol/L (ref <75.0)

## 2023-10-05 ENCOUNTER — Ambulatory Visit: Payer: Self-pay | Admitting: Family Medicine

## 2023-10-06 DIAGNOSIS — R079 Chest pain, unspecified: Secondary | ICD-10-CM | POA: Insufficient documentation

## 2023-10-06 NOTE — Assessment & Plan Note (Addendum)
-   New onset chest pain with one episode associated diaphoresis, shortness of breath, and lightheadedness - not currently experiencing symptoms.   - Family history of premature CAD (father with MI in 5s) - Risk factors include marijuana use, hyperlipidemia - Referral to cardiology - EKG and labs ordered today. EKG similar to 2020.   - Counseled on emergency department evaluation for recurrent episodes with associated symptoms - Discussed possible cardiac catheterization and stenting if indicated

## 2023-10-06 NOTE — Assessment & Plan Note (Signed)
-   Previous neck surgery 15 years ago.  Also having issues with lumbar radiculopathy - Outstanding balance w/ [facility] d/t issues with insurance.  Has also seen [facility].  .  - Will send referral to alternative neurosurgery group ([facility]) - Advised to contact [facility] regarding previous billing issues

## 2023-10-09 ENCOUNTER — Encounter: Payer: Self-pay | Admitting: Cardiology

## 2023-10-09 ENCOUNTER — Telehealth (HOSPITAL_COMMUNITY): Payer: Self-pay

## 2023-10-09 ENCOUNTER — Ambulatory Visit: Attending: Cardiology | Admitting: Cardiology

## 2023-10-09 VITALS — BP 130/70 | HR 58 | Wt 187.0 lb

## 2023-10-09 DIAGNOSIS — R072 Precordial pain: Secondary | ICD-10-CM | POA: Insufficient documentation

## 2023-10-09 DIAGNOSIS — Z720 Tobacco use: Secondary | ICD-10-CM | POA: Diagnosis not present

## 2023-10-09 DIAGNOSIS — R079 Chest pain, unspecified: Secondary | ICD-10-CM | POA: Diagnosis not present

## 2023-10-09 DIAGNOSIS — F17290 Nicotine dependence, other tobacco product, uncomplicated: Secondary | ICD-10-CM | POA: Diagnosis not present

## 2023-10-09 DIAGNOSIS — E7800 Pure hypercholesterolemia, unspecified: Secondary | ICD-10-CM | POA: Diagnosis present

## 2023-10-09 NOTE — Progress Notes (Signed)
 Cardiology Office Note:    Date:  10/09/2023   ID:  Shawn Raymond, DOB 08-Jun-1980, MRN [id number]  PCP:  Chandra Toribio POUR, MD   [facility] Providers Cardiologist:  None     Referring MD: Chandra Toribio POUR, MD   Chief Complaint  Patient presents with   Chest Pain    History of Present Illness:    Shawn Raymond is a 43 y.o. male seen at the request of Dr Chandra for evaluation of chest pain. He has a history of HLD and family history of premature CAD. He is a smoker.  He reports that a month ago he began experiencing anterior left chest pain/tightness. One episode 2 weeks ago he had pain associated with diaphoresis and became lightheaded. No clear association with activity. Notes he has been under a lot of stress. Has been trying to eat healthy and walks daily.   Past Medical History:  Diagnosis Date   Asthma    Sarcoidosis     Past Surgical History:  Procedure Laterality Date   ANTERIOR CERVICAL DECOMP/DISCECTOMY FUSION N/A 04/02/2018   Procedure: ANTERIOR CERVICAL DECOMPRESSION/DISCECTOMY FUSION CERVICAL SIX- CERVICAL SEVEN ;  Surgeon: Lanis Pupa, MD;  Location: [facility] OR;  Service: Neurosurgery;  Laterality: N/A;   FEMUR FRACTURE SURGERY     PATELLA RECONSTRUCTION     POSTERIOR CERVICAL FUSION/FORAMINOTOMY N/A 04/02/2018   Procedure: CERVICAL THREE- CERVICAL FOUR, CERVICAL FOUR- CERVICAL FIVE, CERVICAL FIVE- CERVICAL SIX, CERVICAL SIX- CERVICAL SEVEN LAMINECTOMY WITH LATERAL MASS POSTERIOR SEGMENTAL INSTRUEMNTATION, POSTERIOR LATERAL ARTHRODESIS;  Surgeon: Lanis Pupa, MD;  Location: [facility] OR;  Service: Neurosurgery;  Laterality: N/A;    Current Medications: Current Meds  Medication Sig   albuterol  (VENTOLIN  HFA) 108 (90 Base) MCG/ACT inhaler Inhale 1-2 puffs into the lungs every 4 (four) hours as needed for wheezing or shortness of breath.   budesonide -formoterol  (SYMBICORT ) 160-4.5 MCG/ACT inhaler Inhale 2 puffs into the lungs 2 (two) times  daily.   diclofenac  Sodium (VOLTAREN  ARTHRITIS PAIN) 1 % GEL Apply 2 g topically 4 (four) times daily.   lidocaine  (HM LIDOCAINE  PATCH) 4 % Place 1 patch onto the skin daily.   methocarbamol  (ROBAXIN ) 500 MG tablet Take 1 tablet (500 mg total) by mouth 2 (two) times daily as needed for muscle spasms.   omeprazole  (PRILOSEC) 40 MG capsule Take 1 capsule (40 mg total) by mouth daily.   tacrolimus  (PROTOPIC ) 0.1 % ointment Apply topically 2 (two) times daily.   triamcinolone  ointment (KENALOG ) 0.5 % Apply 1 Application topically 2 (two) times daily. Apply to palm and wrist for two weeks.     Allergies:   Iodine and Shellfish allergy   Social History   Socioeconomic History   Marital status: Divorced    Spouse name: Not on file   Number of children: 2   Years of education: Not on file   Highest education level: GED or equivalent  Occupational History   Not on file  Tobacco Use   Smoking status: Every Day    Current packs/day: 0.00    Types: Cigarettes    Last attempt to quit: 12/24/2017    Years since quitting: 5.7   Smokeless tobacco: Never   Tobacco comments:    patient has been  without a cigarette since 12/27/17  Vaping Use   Vaping status: Never Used  Substance and Sexual Activity   Alcohol use: No   Drug use: Yes    Types: Marijuana    Comment: 3grams a day.  Sexual activity: Not on file  Other Topics Concern   Not on file  Social History Narrative   On disability   Social Drivers of Health   Financial Resource Strain: Low Risk  (10/02/2023)   Overall Financial Resource Strain (CARDIA)    Difficulty of Paying Living Expenses: Not hard at all  Food Insecurity: No Food Insecurity (10/02/2023)   Hunger Vital Sign    Worried About Running Out of Food in the Last Year: Never true    Ran Out of Food in the Last Year: Never true  Transportation Needs: No Transportation Needs (10/02/2023)   PRAPARE - Administrator, Civil Service (Medical): No    Lack of  Transportation (Non-Medical): No  Physical Activity: Sufficiently Active (10/02/2023)   Exercise Vital Sign    Days of Exercise per Week: 5 days    Minutes of Exercise per Session: 30 min  Stress: No Stress Concern Present (10/02/2023)   [employer] of Occupational Health - Occupational Stress Questionnaire    Feeling of Stress: Not at all  Social Connections: Socially Isolated (10/02/2023)   Social Connection and Isolation Panel    Frequency of Communication with Friends and Family: More than three times a week    Frequency of Social Gatherings with Friends and Family: More than three times a week    Attends Religious Services: Never    Database administrator or Organizations: No    Attends Engineer, structural: Not on file    Marital Status: Divorced     Family History: The patient's family history includes Healthy in his mother; Heart attack (age of onset: 84) in his father.  ROS:   Please see the history of present illness.     All other systems reviewed and are negative.  EKGs/Labs/Other Studies Reviewed:    The following studies were reviewed today: Ecg on June 17: sinus brady. Normal. I have personally reviewed and interpreted this study.       Recent Labs: 04/02/2023: ALT 27; BUN 13; Creatinine, Ser 1.17; Hemoglobin 14.4; Platelets 278; Potassium 4.4; Sodium 139; TSH 1.440  Recent Lipid Panel    Component Value Date/Time   CHOL 166 10/02/2023 1101   TRIG 244 (H) 10/02/2023 1101   HDL 31 (L) 10/02/2023 1101   CHOLHDL 5.4 (H) 10/02/2023 1101   LDLCALC 94 10/02/2023 1101     Risk Assessment/Calculations:                Physical Exam:    VS:  BP 130/70   Pulse (!) 58   Wt 187 lb (84.8 kg)   SpO2 95%   BMI 30.18 kg/m     Wt Readings from Last 3 Encounters:  10/09/23 187 lb (84.8 kg)  10/02/23 190 lb 6.4 oz (86.4 kg)  08/29/23 191 lb 12.8 oz (87 kg)     GEN:  Well nourished, well developed in no acute distress HEENT: Normal NECK: No  JVD; No carotid bruits LYMPHATICS: No lymphadenopathy CARDIAC: RRR, no murmurs, rubs, gallops RESPIRATORY:  Clear to auscultation without rales, wheezing or rhonchi  ABDOMEN: Soft, non-tender, non-distended MUSCULOSKELETAL:  No edema; No deformity  SKIN: Warm and dry NEUROLOGIC:  Alert and oriented x 3 PSYCHIATRIC:  Normal affect   ASSESSMENT:    1. Precordial pain   2. Hypercholesteremia   3. Tobacco abuse    PLAN:    In order of problems listed above:  Chest pain. Ecg is ok. Risk factors of HLD, tobacco  abuse and family history of premature CAD. I feel ischemic work up is warranted. Discussed stress testing vs coronary CTA. Given iodine allergy with anaphylaxis will avoid contrast  and arrange for myoview study.  HLD. If he does have CAD will need to go on statin therapy Tobacco abuse. Counseled on cessation      Informed Consent   Shared Decision Making/Informed Consent The risks [chest pain, shortness of breath, cardiac arrhythmias, dizziness, blood pressure fluctuations, myocardial infarction, stroke/transient ischemic attack, nausea, vomiting, allergic reaction, radiation exposure, metallic taste sensation and life-threatening complications (estimated to be 1 in 10,000)], benefits (risk stratification, diagnosing coronary artery disease, treatment guidance) and alternatives of a nuclear stress test were discussed in detail with Mr. Lamorte and he agrees to proceed.       Medication Adjustments/Labs and Tests Ordered: Current medicines are reviewed at length with the patient today.  Concerns regarding medicines are outlined above.  Orders Placed This Encounter  Procedures   Cardiac Stress Test: Informed Consent Details: Physician/Practitioner Attestation; Transcribe to consent form and obtain patient signature   No orders of the defined types were placed in this encounter.   Patient Instructions  Medication Instructions:   *If you need a refill on your cardiac  medications before your next appointment, please call your pharmacy*  Lab Work:  If you have labs (blood work) drawn today and your tests are completely normal, you will receive your results only by: MyChart Message (if you have MyChart) OR A paper copy in the mail If you have any lab test that is abnormal or we need to change your treatment, we will call you to review the results.  Testing/Procedures:   Follow-Up: At [facility], you and your health needs are our priority.  As part of our continuing mission to provide you with exceptional heart care, our providers are all part of one team.  This team includes your primary Cardiologist (physician) and Advanced Practice Providers or APPs (Physician Assistants and Nurse Practitioners) who all work together to provide you with the care you need, when you need it.  Your next appointment:   TBD  Provider:   None    We recommend signing up for the patient portal called MyChart.  Sign up information is provided on this After Visit Summary.  MyChart is used to connect with patients for Virtual Visits (Telemedicine).  Patients are able to view lab/test results, encounter notes, upcoming appointments, etc.  Non-urgent messages can be sent to your provider as well.   To learn more about what you can do with MyChart, go to [email or web address].         Signed, Lataunya Ruud [place], MD  10/09/2023 11:12 AM    [facility]

## 2023-10-09 NOTE — Telephone Encounter (Signed)
 Detailed instructions left on the patient's answering machine. S.Jassiel Flye CCT

## 2023-10-09 NOTE — Patient Instructions (Signed)
 Medication Instructions:  Continue same medications  Lab Work: None ordered  Testing/Procedures: Schedule Stress Myoview   Follow-Up: At [facility], you and your health needs are our priority.  As part of our continuing mission to provide you with exceptional heart care, our providers are all part of one team.  This team includes your primary Cardiologist (physician) and Advanced Practice Providers or APPs (Physician Assistants and Nurse Practitioners) who all work together to provide you with the care you need, when you need it.  Your next appointment:  To Be Determined after test    Provider:  Dr.Jordan   We recommend signing up for the patient portal called MyChart.  Sign up information is provided on this After Visit Summary.  MyChart is used to connect with patients for Virtual Visits (Telemedicine).  Patients are able to view lab/test results, encounter notes, upcoming appointments, etc.  Non-urgent messages can be sent to your provider as well.   To learn more about what you can do with MyChart, go to [email or web address].     Your physician has requested that you have en exercise stress myoview. For further information please visit [email or web address] Please follow instruction sheet, as given.

## 2023-10-10 ENCOUNTER — Other Ambulatory Visit: Payer: Self-pay | Admitting: Cardiology

## 2023-10-10 DIAGNOSIS — R072 Precordial pain: Secondary | ICD-10-CM

## 2023-10-10 DIAGNOSIS — E7800 Pure hypercholesterolemia, unspecified: Secondary | ICD-10-CM

## 2023-10-10 DIAGNOSIS — Z720 Tobacco use: Secondary | ICD-10-CM

## 2023-10-12 ENCOUNTER — Ambulatory Visit (HOSPITAL_COMMUNITY)
Admission: RE | Admit: 2023-10-12 | Discharge: 2023-10-12 | Disposition: A | Discharge status: Home / Self Care | Source: Ambulatory Visit | Attending: Cardiology | Admitting: Cardiology

## 2023-10-12 DIAGNOSIS — R072 Precordial pain: Secondary | ICD-10-CM | POA: Insufficient documentation

## 2023-10-12 DIAGNOSIS — Z720 Tobacco use: Secondary | ICD-10-CM | POA: Diagnosis present

## 2023-10-12 DIAGNOSIS — E7800 Pure hypercholesterolemia, unspecified: Secondary | ICD-10-CM | POA: Diagnosis present

## 2023-10-12 LAB — MYOCARDIAL PERFUSION IMAGING
LV dias vol: 126 mL (ref 62–150)
LV sys vol: 60 mL (ref 4.2–5.8)
Nuc Stress EF: 63 %
Peak HR: 74 {beats}/min
Rest HR: 48 {beats}/min
Rest Nuclear Isotope Dose: 10.8 mCi
SDS: 0
SRS: 12
SSS: 2
ST Depression (mm): 0 mm
Stress Nuclear Isotope Dose: 32 mCi
TID: 1

## 2023-10-12 MED ORDER — TECHNETIUM TC 99M TETROFOSMIN IV KIT
10.8000 | PACK | Freq: Once | INTRAVENOUS | Status: AC | PRN
  Administered 2023-10-12: 10.8 via INTRAVENOUS

## 2023-10-12 MED ORDER — REGADENOSON 0.4 MG/5ML IV SOLN
0.4000 mg | Freq: Once | INTRAVENOUS | Status: AC
  Administered 2023-10-12: 0.4 mg via INTRAVENOUS

## 2023-10-12 MED ORDER — TECHNETIUM TC 99M TETROFOSMIN IV KIT
32.0000 | PACK | Freq: Once | INTRAVENOUS | Status: AC | PRN
  Administered 2023-10-12: 32 via INTRAVENOUS

## 2023-10-12 MED ORDER — REGADENOSON 0.4 MG/5ML IV SOLN
INTRAVENOUS | Status: AC
Start: 2023-10-12 — End: 2023-10-12
  Filled 2023-10-12: qty 5

## 2023-10-15 ENCOUNTER — Ambulatory Visit: Payer: Self-pay

## 2023-10-15 ENCOUNTER — Encounter: Payer: Self-pay | Admitting: Family Medicine

## 2023-10-15 ENCOUNTER — Ambulatory Visit: Payer: Self-pay | Admitting: Cardiology

## 2023-10-15 ENCOUNTER — Telehealth: Payer: Self-pay

## 2023-10-15 NOTE — Telephone Encounter (Signed)
 Copied from CRM [id number]. Topic: Clinical - Medication Refill >> Oct 15, 2023 12:15 PM Powell HERO wrote: Medication: Tramadol  (not seen on chart) requesting some for pain from stress test  Has the patient contacted their pharmacy? Yes (Agent: If no, request that the patient contact the pharmacy for the refill. If patient does not wish to contact the pharmacy document the reason why and proceed with request.) (Agent: If yes, when and what did the pharmacy advise?)  This is the patient's preferred pharmacy:  [id number] - [place], [place] - [address] [place] [address] Phone: [phone] Fax: [phone]  Is this the correct pharmacy for this prescription? Yes If no, delete pharmacy and type the correct one.   Has the prescription been filled recently? No  Is the patient out of the medication? Yes  Has the patient been seen for an appointment in the last year OR does the patient have an upcoming appointment? Yes  Can we respond through MyChart? Yes  Agent: Please be advised that Rx refills may take up to 3 business days. We ask that you follow-up with your pharmacy.

## 2023-10-15 NOTE — Telephone Encounter (Signed)
 This encounter was created in error - please disregard.

## 2023-10-15 NOTE — Telephone Encounter (Signed)
 FYI Only or Action Required?: Action required by provider: medication refill request. Tramadol   Patient was last seen in primary care on 10/02/2023 by Chandra Toribio POUR, MD. Called Nurse Triage reporting No chief complaint on file.. Symptoms began several days ago. Interventions attempted: Prescription medications: tramadol . Symptoms are: unchanged.  Triage Disposition: No disposition on file.  Patient/caregiver understands and will follow disposition?:  Reason for Disposition  [1] SEVERE back pain (e.g., excruciating, unable to do any normal activities) AND [2] not improved 2 hours after pain medicine  Answer Assessment - Initial Assessment Questions 1. ONSET: When did the pain begin?      Started Friday after stress test 2. LOCATION: Where does it hurt? (upper, mid or lower back)     Lower back 3. SEVERITY: How bad is the pain?  (e.g., Scale 1-10; mild, moderate, or severe)   - MILD (1-3): Doesn't interfere with normal activities.    - MODERATE (4-7): Interferes with normal activities or awakens from sleep.    - SEVERE (8-10): Excruciating pain, unable to do any normal activities.      8/10 4. PATTERN: Is the pain constant? (e.g., yes, no; constant, intermittent)      constant 5. RADIATION: Does the pain shoot into your legs or somewhere else?     no 6. CAUSE:  What do you think is causing the back pain?      Stress test 7. BACK OVERUSE:  Any recent lifting of heavy objects, strenuous work or exercise?     Overuse on Friday 8. MEDICINES: What have you taken so far for the pain? (e.g., nothing, acetaminophen , NSAIDS)     Tramadol ; states took last pill this am 9. NEUROLOGIC SYMPTOMS: Do you have any weakness, numbness, or problems with bowel/bladder control?     Just my normal 10. OTHER SYMPTOMS: Do you have any other symptoms? (e.g., fever, abdomen pain, burning with urination, blood in urine)       no 11. PREGNANCY: Is there any chance you are pregnant? When  was your last menstrual period?       na  Protocols used: Back Pain-A-AH

## 2023-10-15 NOTE — Telephone Encounter (Signed)
 Copied from CRM [id number]. Topic: Clinical - Red Word Triage >> Oct 15, 2023 12:17 PM Powell HERO wrote: Red Word that prompted transfer to Nurse Triage: Patient states he is in pain in his back after having a stress test. Refused nurse triage.

## 2023-10-16 ENCOUNTER — Other Ambulatory Visit: Payer: Self-pay | Admitting: Family Medicine

## 2023-10-16 MED ORDER — TRAMADOL HCL 50 MG PO TABS: 50.0000 mg | ORAL_TABLET | Freq: Two times a day (BID) | ORAL | 0 refills | Status: AC | PRN

## 2023-10-25 ENCOUNTER — Encounter: Payer: Self-pay | Admitting: Orthopedic Surgery

## 2023-10-25 NOTE — Progress Notes (Unsigned)
 Referring Physician:  Chandra Toribio POUR, MD [address] [place],  [place] [address]  Primary Physician:  Chandra Toribio POUR, MD  History of Present Illness: 10/30/2023 [name] has a history of HTN, asthma, pulmonary sarcoidosis, obesity, hyperlipidemia.   History of ant/post cervical fusion for myelopathy in 2019 [facility]). He has seen [facility] in the past. Per their notes, he had normal EMG of lower extremities in January of 2024.   He has constant LBP with no leg pain x 6+ years, but it is getting worse. He does feel intermittent weakness, numbness, and tingling in both legs. Pain is worse with prolonged standing/sitting. He feels okay with walking.   He walks 2 miles a day and does yoga in the morning.   He smokes 1/2 PPD x 30 years (on and off).   Bowel/Bladder Dysfunction: urinary urgency, no bowel issues.   Conservative measures:  Physical therapy: no recent, he does yoga on his own Multimodal medical therapy including regular antiinflammatories:  Tramadol , Robaxin , Tylenol , Gabapentin  Injections:  no epidural steroid injections  Past Surgery:   04/02/2018- POSTERIOR CERVICAL FUSION/FORAMINOTOMY and ANTERIOR CERVICAL DECOMP/DISCECTOMY FUSION  CHRISANGEL ESKENAZI has no symptoms of cervical myelopathy.  The symptoms are causing a significant impact on the patient's life.   Review of Systems:  A 10 point review of systems is negative, except for the pertinent positives and negatives detailed in the HPI.  Past Medical History: Past Medical History:  Diagnosis Date   Asthma    Sarcoidosis     Past Surgical History: Past Surgical History:  Procedure Laterality Date   ANTERIOR CERVICAL DECOMP/DISCECTOMY FUSION N/A 04/02/2018   Procedure: ANTERIOR CERVICAL DECOMPRESSION/DISCECTOMY FUSION CERVICAL SIX- CERVICAL SEVEN ;  Surgeon: Lanis Pupa, MD;  Location: [facility] OR;  Service: Neurosurgery;  Laterality: N/A;   FEMUR FRACTURE  SURGERY     PATELLA RECONSTRUCTION     POSTERIOR CERVICAL FUSION/FORAMINOTOMY N/A 04/02/2018   Procedure: CERVICAL THREE- CERVICAL FOUR, CERVICAL FOUR- CERVICAL FIVE, CERVICAL FIVE- CERVICAL SIX, CERVICAL SIX- CERVICAL SEVEN LAMINECTOMY WITH LATERAL MASS POSTERIOR SEGMENTAL INSTRUEMNTATION, POSTERIOR LATERAL ARTHRODESIS;  Surgeon: Lanis Pupa, MD;  Location: [facility] OR;  Service: Neurosurgery;  Laterality: N/A;    Allergies: Allergies as of 10/30/2023 - Review Complete 10/30/2023  Allergen Reaction Noted   Iodine Anaphylaxis 02/19/2020   Shellfish allergy Anaphylaxis 03/16/2014    Medications: Outpatient Encounter Medications as of 10/30/2023  Medication Sig   albuterol  (VENTOLIN  HFA) 108 (90 Base) MCG/ACT inhaler Inhale 1-2 puffs into the lungs every 4 (four) hours as needed for wheezing or shortness of breath.   budesonide -formoterol  (SYMBICORT ) 160-4.5 MCG/ACT inhaler Inhale 2 puffs into the lungs 2 (two) times daily.   diclofenac  Sodium (VOLTAREN  ARTHRITIS PAIN) 1 % GEL Apply 2 g topically 4 (four) times daily.   lidocaine  (HM LIDOCAINE  PATCH) 4 % Place 1 patch onto the skin daily.   methocarbamol  (ROBAXIN ) 500 MG tablet Take 1 tablet (500 mg total) by mouth 2 (two) times daily as needed for muscle spasms.   omeprazole  (PRILOSEC) 40 MG capsule Take 1 capsule (40 mg total) by mouth daily.   tacrolimus  (PROTOPIC ) 0.1 % ointment Apply topically 2 (two) times daily.   triamcinolone  ointment (KENALOG ) 0.5 % Apply 1 Application topically 2 (two) times daily. Apply to palm and wrist for two weeks.   No facility-administered encounter medications on file as of 10/30/2023.    Social History: Social History   Tobacco Use   Smoking status: Every  Day    Current packs/day: 0.00    Types: Cigarettes    Last attempt to quit: 12/24/2017    Years since quitting: 5.8   Smokeless tobacco: Never  Vaping Use   Vaping status: Never Used  Substance Use Topics   Alcohol use: No   Drug use: Yes     Types: Marijuana    Comment: 3grams a day.     Family Medical History: Family History  Problem Relation Age of Onset   Healthy Mother    Heart attack Father 60    Physical Examination: Vitals:   10/30/23 1103  BP: 134/84    General: Patient is well developed, well nourished, calm, collected, and in no apparent distress. Attention to examination is appropriate.  Respiratory: Patient is breathing without any difficulty.   NEUROLOGICAL:     Awake, alert, oriented to person, place, and time.  Speech is clear and fluent. Fund of knowledge is appropriate.   Cranial Nerves: Pupils equal round and reactive to light.  Facial tone is symmetric.    Well healed cervical incisions.   No lower lumbar tenderness noted.   No abnormal lesions on exposed skin.   Strength: Side Biceps Triceps Deltoid Interossei Grip Wrist Ext. Wrist Flex.  R 5 5 5 5 5 5 5   L 5 5 5 5 5 5 5    Side Iliopsoas Quads Hamstring PF DF EHL  R 5 5 5 5 5 5   L 5 5 5 5 5 5    Reflexes are 2+ and symmetric at the biceps, brachioradialis, patella and achilles.   Hoffman's is positive on right > left.  Clonus is not present.   Bilateral upper and lower extremity sensation is intact to light touch.     Gait is normal.     Medical Decision Making  Imaging: No recent lumbar imaging.   Assessment and Plan: Mr. Eltzroth has constant LBP with no leg pain x 6+ years, but it is getting worse. He does feel intermittent weakness, numbness, and tingling in both legs.    No recent lumbar imaging.   He has history of ant/post cervical fusion for myelopathy in 2019 [facility]). He has seen [facility] in the past. Per their notes, he had normal EMG of lower extremities in January of 2024.   Treatment options discussed with patient and following plan made:   - MRI of lumbar spine to further evaluate LBP. No improvement with previous medications or time.  - Discussed PT for lumbar spine. He walks 2  miles a day and does yoga every morning. Will hold off for now.  - Depending on results of lumbar MRI, may revisit formal PT and/or discuss injections. He is afraid of needles so this may not be an option.  - Will schedule MyChart visit to review MRI results once I get them back.   I spent a total of 35 minutes in face-to-face and non-face-to-face activities related to this patient's care today including review of outside records, review of imaging, review of symptoms, physical exam, discussion of differential diagnosis, discussion of treatment options, and documentation.   Thank you for involving me in the care of this patient.   Glade Boys PA-C Dept. of Neurosurgery

## 2023-10-30 ENCOUNTER — Encounter: Payer: Self-pay | Admitting: Orthopedic Surgery

## 2023-10-30 ENCOUNTER — Ambulatory Visit (INDEPENDENT_AMBULATORY_CARE_PROVIDER_SITE_OTHER): Admitting: Orthopedic Surgery

## 2023-10-30 VITALS — BP 134/84 | Ht 66.0 in | Wt 185.6 lb

## 2023-10-30 DIAGNOSIS — R202 Paresthesia of skin: Secondary | ICD-10-CM | POA: Diagnosis not present

## 2023-10-30 DIAGNOSIS — M545 Low back pain, unspecified: Secondary | ICD-10-CM

## 2023-10-30 DIAGNOSIS — R531 Weakness: Secondary | ICD-10-CM

## 2023-10-30 DIAGNOSIS — R200 Anesthesia of skin: Secondary | ICD-10-CM | POA: Diagnosis not present

## 2023-10-30 DIAGNOSIS — G8929 Other chronic pain: Secondary | ICD-10-CM

## 2023-10-30 NOTE — Patient Instructions (Signed)
 It was so nice to see you today. Thank you so much for coming in.    I want to get an MRI of your lower back to look into things further. We will get this approved through your insurance and [facility] will call you to schedule the appointment. Ask about your patient responsibility. You do not need to pay this prior to getting MRI, they can bill you.   After you have the MRI, it can take 14-28 days for me to get the results back. If I don't have them in 2 weeks, we will call to try to get the results.   Once I have the results, we will call you to schedule a follow up MyChart visit with me to review them.   Please do not hesitate to call if you have any questions or concerns. You can also message me in MyChart.   Glade Boys PA-C [phone]     The physicians and staff at [facility] at [facility] are committed to providing excellent care. You may receive a survey asking for feedback about your experience at our office. We value you your feedback and appreciate you taking the time to to fill it out. The [facility] leadership team is also available to discuss your experience in person, feel free to contact us  [phone].

## 2023-11-01 ENCOUNTER — Encounter: Payer: Self-pay | Admitting: Orthopedic Surgery

## 2023-11-06 ENCOUNTER — Other Ambulatory Visit: Payer: Self-pay | Admitting: Family Medicine

## 2023-11-06 MED ORDER — TRAMADOL HCL 50 MG PO TABS: 50.0000 mg | ORAL_TABLET | Freq: Three times a day (TID) | ORAL | 0 refills | Status: AC | PRN

## 2023-11-16 ENCOUNTER — Telehealth: Payer: Self-pay | Admitting: Family Medicine

## 2023-11-16 NOTE — Telephone Encounter (Signed)
 Diagnostic imaging called to inform us  that this patient is scheduled for an MRI on 8/14 and the MRI has been denied. She is asking if the provider will do a peer to peer.

## 2023-11-19 NOTE — Telephone Encounter (Signed)
 Please refer to the MRI referral message.

## 2023-11-23 ENCOUNTER — Other Ambulatory Visit: Payer: Self-pay

## 2023-11-23 DIAGNOSIS — M545 Low back pain, unspecified: Secondary | ICD-10-CM

## 2023-11-23 NOTE — Progress Notes (Signed)
 Order placed for PT

## 2023-11-29 ENCOUNTER — Other Ambulatory Visit

## 2023-12-04 ENCOUNTER — Ambulatory Visit (INDEPENDENT_AMBULATORY_CARE_PROVIDER_SITE_OTHER): Admitting: Family Medicine

## 2023-12-04 ENCOUNTER — Encounter: Payer: Self-pay | Admitting: Family Medicine

## 2023-12-04 VITALS — BP 121/79 | HR 57 | Ht 66.0 in | Wt 185.0 lb

## 2023-12-04 DIAGNOSIS — R079 Chest pain, unspecified: Secondary | ICD-10-CM

## 2023-12-04 DIAGNOSIS — G8929 Other chronic pain: Secondary | ICD-10-CM

## 2023-12-04 DIAGNOSIS — L301 Dyshidrosis [pompholyx]: Secondary | ICD-10-CM | POA: Diagnosis not present

## 2023-12-04 DIAGNOSIS — Z760 Encounter for issue of repeat prescription: Secondary | ICD-10-CM

## 2023-12-04 DIAGNOSIS — J4540 Moderate persistent asthma, uncomplicated: Secondary | ICD-10-CM

## 2023-12-04 DIAGNOSIS — M545 Low back pain, unspecified: Secondary | ICD-10-CM | POA: Insufficient documentation

## 2023-12-04 DIAGNOSIS — R12 Heartburn: Secondary | ICD-10-CM

## 2023-12-04 MED ORDER — TRAMADOL HCL 50 MG PO TABS: 50.0000 mg | ORAL_TABLET | Freq: Three times a day (TID) | ORAL | 0 refills | Status: AC | PRN

## 2023-12-04 MED ORDER — OMEPRAZOLE 40 MG PO CPDR: 40.0000 mg | DELAYED_RELEASE_CAPSULE | Freq: Every day | ORAL | 1 refills | Status: DC

## 2023-12-04 MED ORDER — BUDESONIDE-FORMOTEROL FUMARATE 160-4.5 MCG/ACT IN AERO: 2.0000 | INHALATION_SPRAY | Freq: Two times a day (BID) | RESPIRATORY_TRACT | 11 refills | Status: AC

## 2023-12-04 MED ORDER — TACROLIMUS 0.1 % EX OINT: TOPICAL_OINTMENT | Freq: Two times a day (BID) | CUTANEOUS | 2 refills | Status: AC

## 2023-12-04 MED ORDER — ALBUTEROL SULFATE HFA 108 (90 BASE) MCG/ACT IN AERS: 1.0000 | INHALATION_SPRAY | RESPIRATORY_TRACT | 11 refills | Status: DC | PRN

## 2023-12-04 NOTE — Assessment & Plan Note (Signed)
 Reports ongoing low back pain without radiation to the legs. An MRI has been ordered, but insurance requires a course of physical rehabilitation first. An appointment for rehab is scheduled for 12/05/2023. - Continue current management. - Proceed with scheduled rehab appointment. - Await MRI authorization post-rehab.

## 2023-12-04 NOTE — Patient Instructions (Signed)
 It was nice to see you today,  We addressed the following topics today: -I have resent in your medications. - In 4 months we can do your physical and repeat your lab work.  Have a great day,  Rolan Slain, MD

## 2023-12-04 NOTE — Progress Notes (Signed)
 Established Patient Office Visit  Subjective   Patient ID: Shawn Raymond, male    DOB: [birth year]  Age: 43 y.o. MRN: [id number]  Chief Complaint  Patient presents with   Follow Up Chest Pain   Follow Up Back Pain    HPI  Subjective - Low back pain: Ongoing. Described as all low back. Does not radiate down the legs. Scheduled for rehab on 12/05/2023 as required by insurance prior to MRI authorization. - Chest pain: Resolved. No longer experiencing any chest pain. Reports it was related to stress, which is now mostly better. A recent cardiac perfusion scan was normal. - Rib contusion: Healed. No pain currently - Medication Refills: Requesting refills on tramadol , Symbicort , omeprazole , and tacrolimus  cream. Took last dose of tramadol  this morning.  Medications Current medications include tramadol , Symbicort , omeprazole  (Prilosec), tacrolimus  (Protopic ) cream, triamcinolone  cream, and lidocaine  5% patches.  PMH, PSH, FH, Social Hx PMHx: Gout (uric acid checked in 06/2023, not on daily medication), history of rib injury.  ROS Cardiovascular: Denies chest pain. Musculoskeletal: Reports ongoing low back pain. Denies radiation of pain into legs. Integumentary: Uses tacrolimus  cream.     The 10-year ASCVD risk score (Arnett DK, et al., 2019) is: 6.6%  Health Maintenance Due  Topic Date Due   Hepatitis C Screening  Never done   Pneumococcal Vaccine (1 of 2 - PCV) Never done   Hepatitis B Vaccines 19-59 Average Risk (1 of 3 - 19+ 3-dose series) Never done   HPV VACCINES (1 - 3-dose SCDM series) Never done   COVID-19 Vaccine (1 - 2024-25 season) Never done   INFLUENZA VACCINE  11/16/2023      Objective:     BP 121/79   Pulse (!) 57   Ht 5' 6 (1.676 m)   Wt 185 lb (83.9 kg)   SpO2 99%   BMI 29.86 kg/m    Physical Exam Gen: alert, oriented Pulm: no resp distress Psych: pleasant affect   No results found for any visits on 12/04/23.      Assessment &  Plan:   Chronic bilateral low back pain without sciatica Assessment & Plan: Reports ongoing low back pain without radiation to the legs. An MRI has been ordered, but insurance requires a course of physical rehabilitation first. An appointment for rehab is scheduled for 12/05/2023. - Continue current management. - Proceed with scheduled rehab appointment. - Await MRI authorization post-rehab.   Moderate persistent asthma without complication -     Budesonide -Formoterol  Fumarate; Inhale 2 puffs into the lungs 2 (two) times daily.  Dispense: 10.2 g; Refill: 11 -     Albuterol  Sulfate HFA; Inhale 1-2 puffs into the lungs every 4 (four) hours as needed for wheezing or shortness of breath.  Dispense: 18 each; Refill: 11  Dyshydrosis -     Tacrolimus ; Apply topically 2 (two) times daily.  Dispense: 100 g; Refill: 2  Refill clinic medication management patient -     Omeprazole ; Take 1 capsule (40 mg total) by mouth daily.  Dispense: 90 capsule; Refill: 1  Heartburn -     Omeprazole ; Take 1 capsule (40 mg total) by mouth daily.  Dispense: 90 capsule; Refill: 1  Chest pain, unspecified type Assessment & Plan: History of chest pain, which has now resolved. A recent cardiac perfusion scan was normal, suggesting a non-cardiac etiology. Reports this occurred during a period of stress, which has since improved. - Reassured by normal cardiac workup. - No further workup or treatment needed at  this time.   Other orders -     traMADol  HCl; Take 1 tablet (50 mg total) by mouth every 8 (eight) hours as needed for up to 5 days.  Dispense: 15 tablet; Refill: 0     Return in about 4 months (around 04/04/2024) for physical.    Toribio MARLA Slain, MD

## 2023-12-04 NOTE — Assessment & Plan Note (Signed)
 History of chest pain, which has now resolved. A recent cardiac perfusion scan was normal, suggesting a non-cardiac etiology. Reports this occurred during a period of stress, which has since improved. - Reassured by normal cardiac workup. - No further workup or treatment needed at this time.

## 2023-12-07 ENCOUNTER — Ambulatory Visit: Admitting: Pulmonary Disease

## 2023-12-07 ENCOUNTER — Encounter

## 2023-12-07 DIAGNOSIS — J4540 Moderate persistent asthma, uncomplicated: Secondary | ICD-10-CM

## 2023-12-07 DIAGNOSIS — D869 Sarcoidosis, unspecified: Secondary | ICD-10-CM

## 2023-12-20 ENCOUNTER — Other Ambulatory Visit: Payer: Self-pay | Admitting: Family Medicine

## 2023-12-20 ENCOUNTER — Encounter: Payer: Self-pay | Admitting: Family Medicine

## 2023-12-20 MED ORDER — TRAMADOL HCL 50 MG PO TABS: 50.0000 mg | ORAL_TABLET | Freq: Three times a day (TID) | ORAL | 0 refills | Status: DC | PRN

## 2023-12-20 NOTE — Telephone Encounter (Unsigned)
 Copied from CRM #[id number]. Topic: Clinical - Medication Refill >> Dec 20, 2023  3:24 PM Amy B wrote: Medication: traMADol  (ULTRAM ) 50 MG tablet  Has the patient contacted their pharmacy? No (Agent: If no, request that the patient contact the pharmacy for the refill. If patient does not wish to contact the pharmacy document the reason why and proceed with request.) (Agent: If yes, when and what did the pharmacy advise?)  This is the patient's preferred pharmacy:  [id number] [address] [place], [place] [address] Phone [phone]  Is this the correct pharmacy for this prescription? Yes If no, delete pharmacy and type the correct one.   Has the prescription been filled recently? No  Is the patient out of the medication? Yes  Has the patient been seen for an appointment in the last year OR does the patient have an upcoming appointment? Yes  Can we respond through MyChart? Yes  Agent: Please be advised that Rx refills may take up to 3 business days. We ask that you follow-up with your pharmacy.

## 2023-12-21 ENCOUNTER — Other Ambulatory Visit: Payer: Self-pay | Admitting: Family Medicine

## 2023-12-21 ENCOUNTER — Telehealth: Payer: Self-pay

## 2023-12-21 ENCOUNTER — Other Ambulatory Visit: Payer: Self-pay

## 2023-12-21 MED ORDER — TRAMADOL HCL 50 MG PO TABS: 50.0000 mg | ORAL_TABLET | Freq: Three times a day (TID) | ORAL | 0 refills | Status: AC | PRN

## 2023-12-21 NOTE — Telephone Encounter (Signed)
 Contacted pt and informed him that the medication  is in [place] and he will pick it up there and we apologized for the confusion.

## 2023-12-21 NOTE — Telephone Encounter (Signed)
 Copied from CRM [id number]. Topic: Clinical - Prescription Issue >> Dec 21, 2023  9:42 AM Debby BROCKS wrote: Reason for CRM: Patient states that his medication of (traMADol  (ULTRAM ) 50 MG tablet) went to his normal preferred pharmacy: ([id number] [place], [place] - [address]) However, he is out of town and would need it sent to: [id number] [place], [place] - [address] [facility] >> Dec 21, 2023 10:38 AM Tinnie C wrote: Pt calling in to say that he has been out of the medication for 2 days now and it still hasn't been sent to [place]. He says he is just going to go pick it up at the old pharmacy and this does not need to be addressed anymore.

## 2023-12-21 NOTE — Telephone Encounter (Signed)
 Copied from CRM [id number]. Topic: Clinical - Prescription Issue >> Dec 21, 2023 10:40 AM Tinnie BROCKS wrote: Reason for CRM: Pt is no longer requesting that his tramadol  be transferred to [place]. He is going to the old pharmacy to pick up the medication today. Please disregard communication from today and yesterday regarding this issue.  Disregarding medication request.

## 2023-12-21 NOTE — Telephone Encounter (Signed)
 Copied from CRM [id number]. Topic: Clinical - Prescription Issue >> Dec 21, 2023  9:42 AM Debby BROCKS wrote: Reason for CRM: Patient states that his medication of (traMADol  (ULTRAM ) 50 MG tablet) went to his normal preferred pharmacy: ([id number] [place], [place] - [address]) However, he is out of town and would need it sent to: [id number] [place], [place] - [address] AT [facility] [address] [facility]

## 2023-12-21 NOTE — Telephone Encounter (Signed)
 Copied from CRM [id number]. Topic: Clinical - Prescription Issue >> Dec 21, 2023  9:42 AM Debby BROCKS wrote: Reason for CRM: Patient states that his medication of (traMADol  (ULTRAM ) 50 MG tablet) went to his normal preferred pharmacy: ([id number] [place], [place] - [address]) However, he is out of town and would need it sent to: [id number] [place], [place] - [address] [facility]  Received request to be Disregarded.

## 2023-12-21 NOTE — Telephone Encounter (Signed)
 [facility] [place] and [facility] [place]. PDMP reviewed, no aberrancies.

## 2023-12-28 NOTE — Progress Notes (Deleted)
 Referring Physician:  Chandra Toribio POUR, MD [address] [place],  [place] [address]  Primary Physician:  Chandra Toribio POUR, MD  History of Present Illness: [name] has a history of HTN, asthma, pulmonary sarcoidosis, obesity, hyperlipidemia.   History of ant/post cervical fusion for myelopathy in 2019 [facility]). He has seen [facility] in the past. Per their notes, he had normal EMG of lower extremities in January of 2024.   Last seen by me on 10/30/23 for chronic LBP with no leg pain. Lumbar MRI ordered and was denied as he had not done PT.   PT was ordered and he did not go?***     He has constant LBP with no leg pain x 6+ years, but it is getting worse. He does feel intermittent weakness, numbness, and tingling in both legs. Pain is worse with prolonged standing/sitting. He feels okay with walking.   He walks 2 miles a day and does yoga in the morning.   He smokes 1/2 PPD x 30 years (on and off).   Bowel/Bladder Dysfunction: urinary urgency, no bowel issues.   Conservative measures:  Physical therapy: no recent, he does yoga on his own Multimodal medical therapy including regular antiinflammatories:  Tramadol , Robaxin , Tylenol , Gabapentin  Injections:  no epidural steroid injections  Past Surgery:   04/02/2018- POSTERIOR CERVICAL FUSION/FORAMINOTOMY and ANTERIOR CERVICAL DECOMP/DISCECTOMY FUSION  Shawn Raymond has no symptoms of cervical myelopathy.  The symptoms are causing a significant impact on the patient's life.   Review of Systems:  A 10 point review of systems is negative, except for the pertinent positives and negatives detailed in the HPI.  Past Medical History: Past Medical History:  Diagnosis Date   Asthma    Sarcoidosis     Past Surgical History: Past Surgical History:  Procedure Laterality Date   ANTERIOR CERVICAL DECOMP/DISCECTOMY FUSION N/A 04/02/2018   Procedure: ANTERIOR CERVICAL  DECOMPRESSION/DISCECTOMY FUSION CERVICAL SIX- CERVICAL SEVEN ;  Surgeon: Lanis Pupa, MD;  Location: [facility] OR;  Service: Neurosurgery;  Laterality: N/A;   FEMUR FRACTURE SURGERY     PATELLA RECONSTRUCTION     POSTERIOR CERVICAL FUSION/FORAMINOTOMY N/A 04/02/2018   Procedure: CERVICAL THREE- CERVICAL FOUR, CERVICAL FOUR- CERVICAL FIVE, CERVICAL FIVE- CERVICAL SIX, CERVICAL SIX- CERVICAL SEVEN LAMINECTOMY WITH LATERAL MASS POSTERIOR SEGMENTAL INSTRUEMNTATION, POSTERIOR LATERAL ARTHRODESIS;  Surgeon: Lanis Pupa, MD;  Location: [facility] OR;  Service: Neurosurgery;  Laterality: N/A;    Allergies: Allergies as of 01/03/2024 - Review Complete 12/04/2023  Allergen Reaction Noted   Iodine Anaphylaxis 02/19/2020   Shellfish allergy Anaphylaxis 03/16/2014    Medications: Outpatient Encounter Medications as of 01/03/2024  Medication Sig   albuterol  (VENTOLIN  HFA) 108 (90 Base) MCG/ACT inhaler Inhale 1-2 puffs into the lungs every 4 (four) hours as needed for wheezing or shortness of breath.   budesonide -formoterol  (SYMBICORT ) 160-4.5 MCG/ACT inhaler Inhale 2 puffs into the lungs 2 (two) times daily.   diclofenac  Sodium (VOLTAREN  ARTHRITIS PAIN) 1 % GEL Apply 2 g topically 4 (four) times daily.   lidocaine  (HM LIDOCAINE  PATCH) 4 % Place 1 patch onto the skin daily.   methocarbamol  (ROBAXIN ) 500 MG tablet Take 1 tablet (500 mg total) by mouth 2 (two) times daily as needed for muscle spasms.   omeprazole  (PRILOSEC) 40 MG capsule Take 1 capsule (40 mg total) by mouth daily.   tacrolimus  (PROTOPIC ) 0.1 % ointment Apply topically 2 (two) times daily.   triamcinolone  ointment (KENALOG ) 0.5 % Apply 1 Application topically 2 (  two) times daily. Apply to palm and wrist for two weeks.   No facility-administered encounter medications on file as of 01/03/2024.    Social History: Social History   Tobacco Use   Smoking status: Every Day    Current packs/day: 0.00    Types: Cigarettes    Last attempt to  quit: 12/24/2017    Years since quitting: 6.0   Smokeless tobacco: Never  Vaping Use   Vaping status: Never Used  Substance Use Topics   Alcohol use: No   Drug use: Yes    Types: Marijuana    Comment: 3grams a day.     Family Medical History: Family History  Problem Relation Age of Onset   Healthy Mother    Heart attack Father 39    Physical Examination: There were no vitals filed for this visit.    Awake, alert, oriented to person, place, and time.  Speech is clear and fluent. Fund of knowledge is appropriate.   Cranial Nerves: Pupils equal round and reactive to light.  Facial tone is symmetric.    Well healed cervical incisions.   No lower lumbar tenderness noted.   No abnormal lesions on exposed skin.   Strength: Side Biceps Triceps Deltoid Interossei Grip Wrist Ext. Wrist Flex.  R 5 5 5 5 5 5 5   L 5 5 5 5 5 5 5    Side Iliopsoas Quads Hamstring PF DF EHL  R 5 5 5 5 5 5   L 5 5 5 5 5 5    Reflexes are 2+ and symmetric at the biceps, brachioradialis, patella and achilles.   Hoffman's is positive on right > left.  Clonus is not present.   Bilateral upper and lower extremity sensation is intact to light touch.     Gait is normal.     Medical Decision Making  Imaging: No recent lumbar imaging.   Assessment and Plan: Shawn Raymond has constant LBP with no leg pain x 6+ years, but it is getting worse. He does feel intermittent weakness, numbness, and tingling in both legs.    No recent lumbar imaging.   He has history of ant/post cervical fusion for myelopathy in 2019 [facility]surgery). He has seen [facility] in the past. Per their notes, he had normal EMG of lower extremities in January of 2024.   Treatment options discussed with patient and following plan made:   - MRI of lumbar spine to further evaluate LBP. No improvement with previous medications or time.  - Discussed PT for lumbar spine. He walks 2 miles a day and does yoga every morning.  Will hold off for now.  - Depending on results of lumbar MRI, may revisit formal PT and/or discuss injections. He is afraid of needles so this may not be an option.  - Will schedule MyChart visit to review MRI results once I get them back.   I spent a total of 35 minutes in face-to-face and non-face-to-face activities related to this patient's care today including review of outside records, review of imaging, review of symptoms, physical exam, discussion of differential diagnosis, discussion of treatment options, and documentation.   Thank you for involving me in the care of this patient.   Glade Boys PA-C Dept. of Neurosurgery

## 2024-01-03 ENCOUNTER — Telehealth: Payer: Self-pay | Admitting: Orthopedic Surgery

## 2024-01-03 ENCOUNTER — Ambulatory Visit: Admitting: Orthopedic Surgery

## 2024-01-03 NOTE — Telephone Encounter (Signed)
 I was able to confirm the exact location and date of the patient's previous physical therapy. It was with Dr. Reyes HERO. Ziemba on July 17, 2022, at [facility] in [place]. However, it appears that therapy was for a different issue. The patient is currently out of state due to home renovations and plans to call me back to discuss where he would like to receive PT for the new concern. I will also inform him that his upcoming appointment will need to be rescheduled (or canceled for now if you'd like) again in the future, as he is not yet able to begin physical therapy.

## 2024-01-15 ENCOUNTER — Other Ambulatory Visit: Payer: Self-pay | Admitting: Family Medicine

## 2024-01-15 MED ORDER — TRAMADOL HCL 50 MG PO TABS: 50.0000 mg | ORAL_TABLET | Freq: Three times a day (TID) | ORAL | 0 refills | Status: AC | PRN

## 2024-01-17 ENCOUNTER — Telehealth

## 2024-01-21 NOTE — Progress Notes (Unsigned)
 Referring Physician:  Chandra Toribio POUR, MD [address] [place],  [place] [address]  Primary Physician:  Shawn Toribio POUR, MD  History of Present Illness: [name] has a history of HTN, asthma, pulmonary sarcoidosis, obesity, hyperlipidemia.   History of ant/post cervical fusion for myelopathy in 2019 [facility]). He has seen [facility] in the past. Per their notes, he had normal EMG of lower extremities in January of 2024.   Last seen by me on 10/30/23 for chronic LBP with no leg pain. Lumbar MRI ordered and was denied as he had not done PT.   PT was ordered and he did not go?***     He has constant LBP with no leg pain x 6+ years, but it is getting worse. He does feel intermittent weakness, numbness, and tingling in both legs. Pain is worse with prolonged standing/sitting. He feels okay with walking.   He walks 2 miles a day and does yoga in the morning.   He smokes 1/2 PPD x 30 years (on and off).   Bowel/Bladder Dysfunction: urinary urgency, no bowel issues.   Conservative measures:  Physical therapy: no recent, he does yoga on his own Multimodal medical therapy including regular antiinflammatories:  Tramadol , Robaxin , Tylenol , Gabapentin  Injections:  no epidural steroid injections  Past Surgery:   04/02/2018- POSTERIOR CERVICAL FUSION/FORAMINOTOMY and ANTERIOR CERVICAL DECOMP/DISCECTOMY FUSION  Shawn Raymond has no symptoms of cervical myelopathy.  The symptoms are causing a significant impact on the patient's life.   Review of Systems:  A 10 point review of systems is negative, except for the pertinent positives and negatives detailed in the HPI.  Past Medical History: Past Medical History:  Diagnosis Date   Asthma    Sarcoidosis     Past Surgical History: Past Surgical History:  Procedure Laterality Date   ANTERIOR CERVICAL DECOMP/DISCECTOMY FUSION N/A 04/02/2018   Procedure: ANTERIOR CERVICAL  DECOMPRESSION/DISCECTOMY FUSION CERVICAL SIX- CERVICAL SEVEN ;  Surgeon: Shawn Pupa, MD;  Location: [facility];  Service: Neurosurgery;  Laterality: N/A;   FEMUR FRACTURE SURGERY     PATELLA RECONSTRUCTION     POSTERIOR CERVICAL FUSION/FORAMINOTOMY N/A 04/02/2018   Procedure: CERVICAL THREE- CERVICAL FOUR, CERVICAL FOUR- CERVICAL FIVE, CERVICAL FIVE- CERVICAL SIX, CERVICAL SIX- CERVICAL SEVEN LAMINECTOMY WITH LATERAL MASS POSTERIOR SEGMENTAL INSTRUEMNTATION, POSTERIOR LATERAL ARTHRODESIS;  Surgeon: Shawn Pupa, MD;  Location: [facility] OR;  Service: Neurosurgery;  Laterality: N/A;    Allergies: Allergies as of 01/22/2024 - Review Complete 12/04/2023  Allergen Reaction Noted   Iodine Anaphylaxis 02/19/2020   Shellfish allergy Anaphylaxis 03/16/2014    Medications: Outpatient Encounter Medications as of 01/22/2024  Medication Sig   albuterol  (VENTOLIN  HFA) 108 (90 Base) MCG/ACT inhaler Inhale 1-2 puffs into the lungs every 4 (four) hours as needed for wheezing or shortness of breath.   budesonide -formoterol  (SYMBICORT ) 160-4.5 MCG/ACT inhaler Inhale 2 puffs into the lungs 2 (two) times daily.   diclofenac  Sodium (VOLTAREN  ARTHRITIS PAIN) 1 % GEL Apply 2 g topically 4 (four) times daily.   lidocaine  (HM LIDOCAINE  PATCH) 4 % Place 1 patch onto the skin daily.   methocarbamol  (ROBAXIN ) 500 MG tablet Take 1 tablet (500 mg total) by mouth 2 (two) times daily as needed for muscle spasms.   omeprazole  (PRILOSEC) 40 MG capsule Take 1 capsule (40 mg total) by mouth daily.   tacrolimus  (PROTOPIC ) 0.1 % ointment Apply topically 2 (two) times daily.   triamcinolone  ointment (KENALOG ) 0.5 % Apply 1 Application topically 2 (  two) times daily. Apply to palm and wrist for two weeks.   No facility-administered encounter medications on file as of 01/22/2024.    Social History: Social History   Tobacco Use   Smoking status: Every Day    Current packs/day: 0.00    Types: Cigarettes    Last attempt to  quit: 12/24/2017    Years since quitting: 6.0   Smokeless tobacco: Never  Vaping Use   Vaping status: Never Used  Substance Use Topics   Alcohol use: No   Drug use: Yes    Types: Marijuana    Comment: 3grams a day.     Family Medical History: Family History  Problem Relation Age of Onset   Healthy Mother    Heart attack Father 78    Physical Examination: There were no vitals filed for this visit.    Awake, alert, oriented to person, place, and time.  Speech is clear and fluent. Fund of knowledge is appropriate.   Cranial Nerves: Pupils equal round and reactive to light.  Facial tone is symmetric.    Well healed cervical incisions.   No lower lumbar tenderness noted.   No abnormal lesions on exposed skin.   Strength: Side Biceps Triceps Deltoid Interossei Grip Wrist Ext. Wrist Flex.  R 5 5 5 5 5 5 5   L 5 5 5 5 5 5 5    Side Iliopsoas Quads Hamstring PF DF EHL  R 5 5 5 5 5 5   L 5 5 5 5 5 5    Reflexes are 2+ and symmetric at the biceps, brachioradialis, patella and achilles.   Hoffman's is positive on right > left.  Clonus is not present.   Bilateral upper and lower extremity sensation is intact to light touch.     Gait is normal.     Medical Decision Making  Imaging: No recent lumbar imaging.   Assessment and Plan: [name] has constant LBP with no leg pain x 6+ years, but it is getting worse. He does feel intermittent weakness, numbness, and tingling in both legs.    No recent lumbar imaging.   He has history of ant/post cervical fusion for myelopathy in 2019 [facility]). He has seen [facility] in the past. Per their notes, he had normal EMG of lower extremities in January of 2024.   Treatment options discussed with patient and following plan made:   - MRI of lumbar spine to further evaluate LBP. No improvement with previous medications or time.  - Discussed PT for lumbar spine. He walks 2 miles a day and does yoga every morning.  Will hold off for now.  - Depending on results of lumbar MRI, may revisit formal PT and/or discuss injections. He is afraid of needles so this may not be an option.  - Will schedule MyChart visit to review MRI results once I get them back.   I spent a total of 35 minutes in face-to-face and non-face-to-face activities related to this patient's care today including review of outside records, review of imaging, review of symptoms, physical exam, discussion of differential diagnosis, discussion of treatment options, and documentation.   Thank you for involving me in the care of this patient.   Glade Boys PA-C Dept. of Neurosurgery

## 2024-01-22 ENCOUNTER — Ambulatory Visit: Admitting: Orthopedic Surgery

## 2024-01-22 ENCOUNTER — Ambulatory Visit (INDEPENDENT_AMBULATORY_CARE_PROVIDER_SITE_OTHER)

## 2024-01-22 ENCOUNTER — Encounter: Payer: Self-pay | Admitting: Orthopedic Surgery

## 2024-01-22 VITALS — BP 136/84 | Ht 66.0 in | Wt 185.0 lb

## 2024-01-22 DIAGNOSIS — G8929 Other chronic pain: Secondary | ICD-10-CM

## 2024-01-22 DIAGNOSIS — M25562 Pain in left knee: Secondary | ICD-10-CM | POA: Diagnosis not present

## 2024-01-22 DIAGNOSIS — M545 Low back pain, unspecified: Secondary | ICD-10-CM | POA: Diagnosis not present

## 2024-01-22 DIAGNOSIS — R29898 Other symptoms and signs involving the musculoskeletal system: Secondary | ICD-10-CM | POA: Diagnosis not present

## 2024-01-22 NOTE — Patient Instructions (Signed)
 It was so nice to see you today. Thank you so much for coming in.    I ordered xrays of your lower back for you to get today at our office. I will message you with results.   I sent physical therapy orders to [facility] in [place]. You can call them at [phone] if you don't hear from them to schedule your visit. They are located at [address]   I want you to see ortho at the [facility] in [place] for evaluation of your left knee. They should call you to schedule an appointment or you can call them at [phone]. They are at [address] They also have an urgent care where you can just show to be seen.   I will message you in 6 weeks to check on your progress with PT. Please do not hesitate to call if you have any questions or concerns. You can also message me in MyChart.   Glade Boys PA-C [phone]     The physicians and staff at [facility] at [facility] are committed to providing excellent care. You may receive a survey asking for feedback about your experience at our office. We value you your feedback and appreciate you taking the time to to fill it out. The [facility] leadership team is also available to discuss your experience in person, feel free to contact us  [phone].

## 2024-01-23 ENCOUNTER — Encounter: Payer: Self-pay | Admitting: Pulmonary Disease

## 2024-01-23 ENCOUNTER — Ambulatory Visit: Admitting: *Deleted

## 2024-01-23 ENCOUNTER — Ambulatory Visit: Admitting: Pulmonary Disease

## 2024-01-23 VITALS — BP 130/72 | HR 73 | Ht 66.5 in | Wt 185.0 lb

## 2024-01-23 DIAGNOSIS — Z87891 Personal history of nicotine dependence: Secondary | ICD-10-CM

## 2024-01-23 DIAGNOSIS — J4489 Other specified chronic obstructive pulmonary disease: Secondary | ICD-10-CM | POA: Diagnosis not present

## 2024-01-23 DIAGNOSIS — J4540 Moderate persistent asthma, uncomplicated: Secondary | ICD-10-CM

## 2024-01-23 DIAGNOSIS — D869 Sarcoidosis, unspecified: Secondary | ICD-10-CM

## 2024-01-23 LAB — PULMONARY FUNCTION TEST
DL/VA % pred: 96 %
DL/VA: 4.46 ml/min/mmHg/L
DLCO cor % pred: 93 %
DLCO cor: 25.45 ml/min/mmHg
DLCO unc % pred: 93 %
DLCO unc: 25.45 ml/min/mmHg
FEF 25-75 Post: 2.19 L/s
FEF 25-75 Pre: 1.51 L/s
FEF2575-%Change-Post: 44 %
FEF2575-%Pred-Post: 63 %
FEF2575-%Pred-Pre: 43 %
FEV1-%Change-Post: 14 %
FEV1-%Pred-Post: 72 %
FEV1-%Pred-Pre: 63 %
FEV1-Post: 2.65 L
FEV1-Pre: 2.32 L
FEV1FVC-%Change-Post: 1 %
FEV1FVC-%Pred-Pre: 84 %
FEV6-%Change-Post: 13 %
FEV6-%Pred-Post: 86 %
FEV6-%Pred-Pre: 76 %
FEV6-Post: 3.89 L
FEV6-Pre: 3.43 L
FEV6FVC-%Change-Post: 0 %
FEV6FVC-%Pred-Post: 102 %
FEV6FVC-%Pred-Pre: 101 %
FVC-%Change-Post: 12 %
FVC-%Pred-Post: 84 %
FVC-%Pred-Pre: 74 %
FVC-Post: 3.92 L
FVC-Pre: 3.47 L
Post FEV1/FVC ratio: 68 %
Post FEV6/FVC ratio: 99 %
Pre FEV1/FVC ratio: 67 %
Pre FEV6/FVC Ratio: 99 %
RV % pred: 134 %
RV: 2.31 L
TLC % pred: 97 %
TLC: 6.11 L

## 2024-01-23 MED ORDER — BREZTRI AEROSPHERE 160-9-4.8 MCG/ACT IN AERO: INHALATION_SPRAY | RESPIRATORY_TRACT | Status: AC

## 2024-01-23 NOTE — Patient Instructions (Signed)
 Full PFT performed today.

## 2024-01-23 NOTE — Progress Notes (Signed)
 Synopsis: Referred in May 2025 for Sarcoidosis  Subjective:   PATIENT ID: Shawn Raymond GENDER: male DOB: 11-05-1980, MRN: [id number]  HPI  Chief Complaint  Patient presents with   Medical Management of Chronic Issues   Shawn Raymond is a 43 year old male, former smoker with history of asthma and sarcoidosis who returns to pulmonary clinic.   Initial OV 08/29/23 He was diagnosed with pulmonary sarcoid in 2010 via transbronchial biopsy. He does not remember his treatment regimen from initial diagnosis, steroids vs methotrexate. He reports no issues regarding sarcoidosis since his initial diagnosis. He does have asthma and is using symbicort  160-4.75mcg 2 puffs twice daily and as needed albuterol . He reports season changes tend to bother his breathing most. Last asthma exacerbation was 09/2022 where he was treated with steroids.   He is doing yoga mainly for his back issues and spinal stenosis. He is on disability from a neck injury where he required surgery. He quit smoking in 2019. He is a Arts development officer.   OV 01/23/24 He has been doing fine since last visit. Remains on symbicort  2 puffs twice daily.  PFTs today show mild obstruction with significant bronchodilator response. He did not use symbicort  today.  Past Medical History:  Diagnosis Date   Asthma    Sarcoidosis      Family History  Problem Relation Age of Onset   Healthy Mother    Heart attack Father 63     Social History   Socioeconomic History   Marital status: Divorced    Spouse name: Not on file   Number of children: 2   Years of education: Not on file   Highest education level: GED or equivalent  Occupational History   Not on file  Tobacco Use   Smoking status: Every Day    Current packs/day: 0.00    Types: Cigarettes    Last attempt to quit: 12/24/2017    Years since quitting: 6.0   Smokeless tobacco: Never  Vaping Use   Vaping status: Never Used  Substance and Sexual Activity   Alcohol use: No    Drug use: Yes    Types: Marijuana    Comment: 3grams a day.    Sexual activity: Not on file  Other Topics Concern   Not on file  Social History Narrative   On disability   Social Drivers of Health   Financial Resource Strain: Low Risk  (10/02/2023)   Overall Financial Resource Strain (CARDIA)    Difficulty of Paying Living Expenses: Not hard at all  Food Insecurity: No Food Insecurity (10/02/2023)   Hunger Vital Sign    Worried About Running Out of Food in the Last Year: Never true    Ran Out of Food in the Last Year: Never true  Transportation Needs: No Transportation Needs (10/02/2023)   PRAPARE - Administrator, Civil Service (Medical): No    Lack of Transportation (Non-Medical): No  Physical Activity: Sufficiently Active (10/02/2023)   Exercise Vital Sign    Days of Exercise per Week: 5 days    Minutes of Exercise per Session: 30 min  Stress: No Stress Concern Present (10/02/2023)   [employer] of Occupational Health - Occupational Stress Questionnaire    Feeling of Stress: Not at all  Social Connections: Socially Isolated (10/02/2023)   Social Connection and Isolation Panel    Frequency of Communication with Friends and Family: More than three times a week    Frequency of Social Gatherings with Friends  and Family: More than three times a week    Attends Religious Services: Never    Active Member of Clubs or Organizations: No    Attends Engineer, structural: Not on file    Marital Status: Divorced  Intimate Partner Violence: Not on file     Allergies  Allergen Reactions   Iodine Anaphylaxis   Shellfish Allergy Anaphylaxis     Outpatient Medications Prior to Visit  Medication Sig Dispense Refill   albuterol  (VENTOLIN  HFA) 108 (90 Base) MCG/ACT inhaler Inhale 1-2 puffs into the lungs every 4 (four) hours as needed for wheezing or shortness of breath. 18 each 11   budesonide -formoterol  (SYMBICORT ) 160-4.5 MCG/ACT inhaler Inhale 2 puffs into the  lungs 2 (two) times daily. 10.2 g 11   diclofenac  Sodium (VOLTAREN  ARTHRITIS PAIN) 1 % GEL Apply 2 g topically 4 (four) times daily. 50 g 2   lidocaine  (HM LIDOCAINE  PATCH) 4 % Place 1 patch onto the skin daily. 15 patch 1   methocarbamol  (ROBAXIN ) 500 MG tablet Take 1,000 mg by mouth.     methylPREDNISolone  (MEDROL  DOSEPAK) 4 MG TBPK tablet Follow schedule on package instructions     omeprazole  (PRILOSEC) 40 MG capsule Take 1 capsule (40 mg total) by mouth daily. 90 capsule 1   tacrolimus  (PROTOPIC ) 0.1 % ointment Apply topically 2 (two) times daily. 100 g 2   triamcinolone  ointment (KENALOG ) 0.5 % Apply 1 Application topically 2 (two) times daily. Apply to palm and wrist for two weeks. 60 g 1   No facility-administered medications prior to visit.    Review of Systems  Constitutional:  Negative for chills, fever, malaise/fatigue and weight loss.  HENT:  Negative for congestion, sinus pain and sore throat.   Eyes: Negative.   Respiratory:  Negative for cough, hemoptysis, sputum production, shortness of breath and wheezing.   Cardiovascular:  Negative for chest pain, palpitations, orthopnea, claudication and leg swelling.  Gastrointestinal:  Negative for abdominal pain, heartburn, nausea and vomiting.  Genitourinary: Negative.   Musculoskeletal:  Positive for back pain. Negative for joint pain and myalgias.  Skin:  Negative for rash.  Neurological:  Negative for weakness.  Endo/Heme/Allergies: Negative.   Psychiatric/Behavioral: Negative.      Objective:   Vitals:   01/23/24 1451  BP: 130/72  Pulse: 73  SpO2: 97%  Weight: 185 lb (83.9 kg)  Height: 5' 6.5 (1.689 m)      Physical Exam Constitutional:      General: He is not in acute distress.    Appearance: Normal appearance.  Eyes:     General: No scleral icterus.    Conjunctiva/sclera: Conjunctivae normal.  Cardiovascular:     Rate and Rhythm: Normal rate and regular rhythm.  Pulmonary:     Breath sounds: No  wheezing, rhonchi or rales.  Musculoskeletal:     Right lower leg: No edema.     Left lower leg: No edema.       CBC    Component Value Date/Time   WBC 8.9 04/02/2023 1103   WBC 13.4 (H) 04/04/2019 0839   RBC 4.78 04/02/2023 1103   RBC 4.80 04/04/2019 0839   HGB 14.4 04/02/2023 1103   HCT 43.2 04/02/2023 1103   PLT 278 04/02/2023 1103   MCV 90 04/02/2023 1103   MCH 30.1 04/02/2023 1103   MCH 29.0 04/04/2019 0839   MCHC 33.3 04/02/2023 1103   MCHC 32.3 04/04/2019 0839   RDW 12.7 04/02/2023 1103   LYMPHSABS 1.9 04/02/2023 1103  MONOABS 0.7 04/04/2019 0839   EOSABS 0.2 04/02/2023 1103   BASOSABS 0.1 04/02/2023 1103     Chest imaging: CXR 04/04/2019 Lungs are clear. Heart size and pulmonary vascularity are normal. No adenopathy. Postoperative changes noted in the lower cervical spine region.  PFT:    Latest Ref Rng & Units 01/23/2024   12:22 PM  PFT Results  FVC-Pre L 3.47  P  FVC-Predicted Pre % 74  P  FVC-Post L 3.92  P  FVC-Predicted Post % 84  P  Pre FEV1/FVC % % 67  P  Post FEV1/FCV % % 68  P  FEV1-Pre L 2.32  P  FEV1-Predicted Pre % 63  P  FEV1-Post L 2.65  P  DLCO uncorrected ml/min/mmHg 25.45  P  DLCO UNC% % 93  P  DLCO corrected ml/min/mmHg 25.45  P  DLCO COR %Predicted % 93  P  DLVA Predicted % 96  P  TLC L 6.11  P  TLC % Predicted % 97  P  RV % Predicted % 134  P    P Preliminary result    Labs:  Path:  Echo:  Heart Catheterization:       Assessment & Plan:   Asthma-COPD overlap syndrome (HCC)  Discussion: Sheamus Hasting is a 43 year old male, former smoker with history of asthma and sarcoidosis who returns to pulmonary clinic.   Asthma-COPD overlap syndrome PFTs show fixed obstruction with significant bronchodilator response.  - try breztri inhaler 2 puffs twice daily, samples provided - hold symbicort  while using breztri - he is to request prescription of breztri if notable benefit provided - albuterol  inhaler as  needed  Hx of Sarcoidosis Diagnosed in 2010 via transbronchial biopsy - continue to monitor  Schedule follow-up in 6 months  Dorn Chill, MD [facility] Office: [phone]   Current Outpatient Medications:    albuterol  (VENTOLIN  HFA) 108 (90 Base) MCG/ACT inhaler, Inhale 1-2 puffs into the lungs every 4 (four) hours as needed for wheezing or shortness of breath., Disp: 18 each, Rfl: 11   budesonide -formoterol  (SYMBICORT ) 160-4.5 MCG/ACT inhaler, Inhale 2 puffs into the lungs 2 (two) times daily., Disp: 10.2 g, Rfl: 11   budesonide -glycopyrrolate-formoterol  (BREZTRI AEROSPHERE) 160-9-4.8 MCG/ACT AERO inhaler, 4 samples, Disp: , Rfl:    diclofenac  Sodium (VOLTAREN  ARTHRITIS PAIN) 1 % GEL, Apply 2 g topically 4 (four) times daily., Disp: 50 g, Rfl: 2   lidocaine  (HM LIDOCAINE  PATCH) 4 %, Place 1 patch onto the skin daily., Disp: 15 patch, Rfl: 1   methocarbamol  (ROBAXIN ) 500 MG tablet, Take 1,000 mg by mouth., Disp: , Rfl:    methylPREDNISolone  (MEDROL  DOSEPAK) 4 MG TBPK tablet, Follow schedule on package instructions, Disp: , Rfl:    omeprazole  (PRILOSEC) 40 MG capsule, Take 1 capsule (40 mg total) by mouth daily., Disp: 90 capsule, Rfl: 1   tacrolimus  (PROTOPIC ) 0.1 % ointment, Apply topically 2 (two) times daily., Disp: 100 g, Rfl: 2   triamcinolone  ointment (KENALOG ) 0.5 %, Apply 1 Application topically 2 (two) times daily. Apply to palm and wrist for two weeks., Disp: 60 g, Rfl: 1

## 2024-01-23 NOTE — Patient Instructions (Addendum)
 Try Breztri inahaler 2 puffs twice daily - rinse mouth out after each use  If you like the breztri, please let us  know and we will send in a prescription   Stop symbicort  inhaler while using breztri  Use albuterol  inhaler 1-2 puffs every 4-6 hours as needed  Your breathing tests show fixed obstruction with significant response to albuterol  consistent with asthma-COPD overlap syndrome  Follow up in 6 months

## 2024-01-23 NOTE — Progress Notes (Addendum)
 Full PFT performed today.

## 2024-02-11 ENCOUNTER — Other Ambulatory Visit: Payer: Self-pay | Admitting: Family Medicine

## 2024-02-11 MED ORDER — TRAMADOL HCL 50 MG PO TABS: 50.0000 mg | ORAL_TABLET | Freq: Two times a day (BID) | ORAL | 0 refills | Status: AC | PRN

## 2024-02-21 ENCOUNTER — Telehealth: Payer: Self-pay

## 2024-02-21 NOTE — Telephone Encounter (Signed)
 Patient has an appointment on 02/27/24 with Stacy to follow up on symptoms after getting PT done. Do not see any notes on this.I called patient and left a message to call us  back or send mychart message. Did patient go to PT? If not, need to reschedule upcoming appointment until PT is completed and follow up at that time.

## 2024-02-24 NOTE — Progress Notes (Deleted)
 Referring Physician:  Chandra Toribio POUR, MD [address] [place],  [place] [address]  Primary Physician:  Chandra Toribio POUR, MD  History of Present Illness: [name] has a history of HTN, asthma, pulmonary sarcoidosis, obesity, hyperlipidemia.   History of ant/post cervical fusion for myelopathy in 2019 [facility]). He has seen [facility] in the past. Per their notes, he had normal EMG of lower extremities in January of 2024.   Last seen by me on 01/22/24- MRI lumbar was denied as he had not done PT. PT was reordered and he is here for follow up.   He has not yet started PT.        PT was ordered and he did not go- he is living with his dad in [place] while his house is being renovated.   Had episode last week with increased back pain after standing. He had weakness in his legs that lasted for a few days. Legs feel normal now.   He has constant LBP with no leg pain. He has numbness, tingling, and weakness in his legs that is intermittent. Pain is worse prolonged standing/sitting. Pain is better with medications, bending to touch toes, yoga.   He is taking prn ultram  from PCP.   He usually walks 2 miles a day and does yoga in the morning. He injured his left knee a few days ago and has not been walking due to pain.   He smokes 1/2 PPD x 30 years (on and off).   Bowel/Bladder Dysfunction: urinary urgency, no bowel issues. No perineal numbness.   Conservative measures:  Physical therapy: no recent, he does yoga on his own Multimodal medical therapy including regular antiinflammatories:  Tramadol , Robaxin , Tylenol , Gabapentin  Injections:  no epidural steroid injections  Past Surgery:   04/02/2018- POSTERIOR CERVICAL FUSION/FORAMINOTOMY and ANTERIOR CERVICAL DECOMP/DISCECTOMY FUSION  Shawn Raymond has no symptoms of cervical myelopathy.  The symptoms are causing a significant impact on the patient's life.   Review of Systems:  A 10  point review of systems is negative, except for the pertinent positives and negatives detailed in the HPI.  Past Medical History: Past Medical History:  Diagnosis Date   Asthma    Sarcoidosis     Past Surgical History: Past Surgical History:  Procedure Laterality Date   ANTERIOR CERVICAL DECOMP/DISCECTOMY FUSION N/A 04/02/2018   Procedure: ANTERIOR CERVICAL DECOMPRESSION/DISCECTOMY FUSION CERVICAL SIX- CERVICAL SEVEN ;  Surgeon: Lanis Pupa, MD;  Location: [facility] OR;  Service: Neurosurgery;  Laterality: N/A;   FEMUR FRACTURE SURGERY     PATELLA RECONSTRUCTION     POSTERIOR CERVICAL FUSION/FORAMINOTOMY N/A 04/02/2018   Procedure: CERVICAL THREE- CERVICAL FOUR, CERVICAL FOUR- CERVICAL FIVE, CERVICAL FIVE- CERVICAL SIX, CERVICAL SIX- CERVICAL SEVEN LAMINECTOMY WITH LATERAL MASS POSTERIOR SEGMENTAL INSTRUEMNTATION, POSTERIOR LATERAL ARTHRODESIS;  Surgeon: Lanis Pupa, MD;  Location: [facility] OR;  Service: Neurosurgery;  Laterality: N/A;    Allergies: Allergies as of 02/27/2024 - Review Complete 01/23/2024  Allergen Reaction Noted   Iodine Anaphylaxis 02/19/2020   Shellfish allergy Anaphylaxis 03/16/2014    Medications: Outpatient Encounter Medications as of 02/27/2024  Medication Sig   albuterol  (VENTOLIN  HFA) 108 (90 Base) MCG/ACT inhaler Inhale 1-2 puffs into the lungs every 4 (four) hours as needed for wheezing or shortness of breath.   budesonide -formoterol  (SYMBICORT ) 160-4.5 MCG/ACT inhaler Inhale 2 puffs into the lungs 2 (two) times daily.   budesonide -glycopyrrolate-formoterol  (BREZTRI AEROSPHERE) 160-9-4.8 MCG/ACT AERO inhaler 4 samples   diclofenac  Sodium (VOLTAREN  ARTHRITIS  PAIN) 1 % GEL Apply 2 g topically 4 (four) times daily.   lidocaine  (HM LIDOCAINE  PATCH) 4 % Place 1 patch onto the skin daily.   methocarbamol  (ROBAXIN ) 500 MG tablet Take 1,000 mg by mouth.   omeprazole  (PRILOSEC) 40 MG capsule Take 1 capsule (40 mg total) by mouth daily.   tacrolimus  (PROTOPIC )  0.1 % ointment Apply topically 2 (two) times daily.   triamcinolone  ointment (KENALOG ) 0.5 % Apply 1 Application topically 2 (two) times daily. Apply to palm and wrist for two weeks.   No facility-administered encounter medications on file as of 02/27/2024.    Social History: Social History   Tobacco Use   Smoking status: Every Day    Current packs/day: 0.00    Types: Cigarettes    Last attempt to quit: 12/24/2017    Years since quitting: 6.1   Smokeless tobacco: Never  Vaping Use   Vaping status: Never Used  Substance Use Topics   Alcohol use: No   Drug use: Yes    Types: Marijuana    Comment: 3grams a day.     Family Medical History: Family History  Problem Relation Age of Onset   Healthy Mother    Heart attack Father 29    Physical Examination: There were no vitals filed for this visit.     Awake, alert, oriented to person, place, and time.  Speech is clear and fluent. Fund of knowledge is appropriate.   Cranial Nerves: Pupils equal round and reactive to light.  Facial tone is symmetric.    No lower lumbar tenderness noted.   No abnormal lesions on exposed skin.   Strength: Side Iliopsoas Quads Hamstring PF DF EHL  R 5 5 5 5 5 5   L 5 5 5 5 5 5    No gross weakness noted, but does not give great effort in left leg due to knee pain.   Reflexes are 2+ and symmetric at the patella and achilles, left achilles not tested due to knee pain.  Clonus is not present.   Bilateral lower extremity sensation is intact to light touch but somewhat diminished medial calf bilaterally.   He has painful limited ROM of left knee.   He uses a walking stick and ambulates with a limp favoring left knee.    Medical Decision Making  Imaging: No recent lumbar imaging.   Assessment and Plan: [name] had an episode last week with increased back pain after standing. He had weakness in his legs that lasted for a few days. Legs feel normal now.   He continues with constant LBP  with no leg pain. He has numbness, tingling, and weakness in his legs that is intermittent. Pain is worse prolonged standing/sitting.  Injury to left knee (had TKA in 2009) in last week with persistent pain.   No recent lumbar imaging.   He has history of ant/post cervical fusion for myelopathy in 2019 [facility]). He has seen [facility] in the past. Per their notes, he had normal EMG of lower extremities in January of 2024.   Treatment options discussed with patient and following plan made:   - Lumbar xrays today in our office. Will message him with results.  - PT orders to [facility] in [place]. He should call them to schedule.  - Referral to [facility] in [place] for left knee pain. He may go to ortho UC there as well.  - Since he is staying in [place], will message him in 6  weeks to check on progress with PT.  - Continue prn ultram  from PCP.   I spent a total of 25 minutes in face-to-face and non-face-to-face activities related to this patient's care today including review of outside records, review of imaging, review of symptoms, physical exam, discussion of differential diagnosis, discussion of treatment options, and documentation.   ADDENDUM 01/22/24:  Lumbar xrays dated 01/22/24 (done on his way out of clinic):  Good gross maintenance of disc space.   Message sent to patient regarding xrays. Will check radiology report when it is back as well.   Glade Boys PA-C Dept. of Neurosurgery

## 2024-02-27 ENCOUNTER — Ambulatory Visit: Admitting: Orthopedic Surgery

## 2024-03-04 ENCOUNTER — Other Ambulatory Visit: Payer: Self-pay | Admitting: Family Medicine

## 2024-03-04 MED ORDER — TRAMADOL HCL 50 MG PO TABS: 50.0000 mg | ORAL_TABLET | Freq: Three times a day (TID) | ORAL | 0 refills | Status: DC | PRN

## 2024-03-04 MED ORDER — TRAMADOL HCL 50 MG PO TABS: 50.0000 mg | ORAL_TABLET | Freq: Three times a day (TID) | ORAL | 0 refills | Status: AC | PRN

## 2024-03-04 NOTE — Addendum Note (Signed)
 Addended by: CHANDRA TORIBIO POUR on: 03/04/2024 04:58 PM   Modules accepted: Orders

## 2024-03-17 ENCOUNTER — Other Ambulatory Visit: Payer: Self-pay

## 2024-03-17 MED ORDER — TRIAMCINOLONE ACETONIDE 0.5 % EX OINT: 1.0000 | TOPICAL_OINTMENT | Freq: Two times a day (BID) | CUTANEOUS | 1 refills | Status: DC

## 2024-03-17 NOTE — Telephone Encounter (Signed)
 Rx has been sent

## 2024-03-25 ENCOUNTER — Other Ambulatory Visit: Payer: Self-pay | Admitting: Family Medicine

## 2024-03-25 DIAGNOSIS — J4540 Moderate persistent asthma, uncomplicated: Secondary | ICD-10-CM

## 2024-03-27 ENCOUNTER — Other Ambulatory Visit: Payer: Self-pay | Admitting: Family Medicine

## 2024-03-27 DIAGNOSIS — R7303 Prediabetes: Secondary | ICD-10-CM

## 2024-03-27 DIAGNOSIS — E66811 Obesity, class 1: Secondary | ICD-10-CM

## 2024-03-27 DIAGNOSIS — I10 Essential (primary) hypertension: Secondary | ICD-10-CM

## 2024-03-27 DIAGNOSIS — J4540 Moderate persistent asthma, uncomplicated: Secondary | ICD-10-CM

## 2024-03-27 DIAGNOSIS — E782 Mixed hyperlipidemia: Secondary | ICD-10-CM

## 2024-03-31 ENCOUNTER — Other Ambulatory Visit: Payer: Self-pay | Admitting: Family Medicine

## 2024-03-31 ENCOUNTER — Ambulatory Visit (INDEPENDENT_AMBULATORY_CARE_PROVIDER_SITE_OTHER): Admitting: Family Medicine

## 2024-03-31 ENCOUNTER — Other Ambulatory Visit

## 2024-03-31 VITALS — BP 129/80 | HR 71 | Ht 66.5 in | Wt 189.0 lb

## 2024-03-31 DIAGNOSIS — J4540 Moderate persistent asthma, uncomplicated: Secondary | ICD-10-CM

## 2024-03-31 DIAGNOSIS — E6609 Other obesity due to excess calories: Secondary | ICD-10-CM

## 2024-03-31 DIAGNOSIS — R7303 Prediabetes: Secondary | ICD-10-CM

## 2024-03-31 DIAGNOSIS — E782 Mixed hyperlipidemia: Secondary | ICD-10-CM

## 2024-03-31 DIAGNOSIS — I10 Essential (primary) hypertension: Secondary | ICD-10-CM

## 2024-03-31 MED ORDER — IPRATROPIUM BROMIDE 0.02 % IN SOLN
0.5000 mg | Freq: Once | RESPIRATORY_TRACT | Status: AC
  Administered 2024-03-31: 11:00:00 0.5 mg via RESPIRATORY_TRACT

## 2024-03-31 MED ORDER — ALBUTEROL SULFATE (2.5 MG/3ML) 0.083% IN NEBU
2.5000 mg | INHALATION_SOLUTION | Freq: Once | RESPIRATORY_TRACT | Status: AC
  Administered 2024-03-31: 11:00:00 2.5 mg via RESPIRATORY_TRACT

## 2024-03-31 MED ORDER — TRAMADOL HCL 50 MG PO TABS: 50.0000 mg | ORAL_TABLET | Freq: Three times a day (TID) | ORAL | 0 refills | Status: DC | PRN

## 2024-03-31 MED ORDER — TRAMADOL HCL 50 MG PO TABS: 50.0000 mg | ORAL_TABLET | Freq: Three times a day (TID) | ORAL | 0 refills | Status: AC | PRN

## 2024-03-31 MED ORDER — PREDNISONE 20 MG PO TABS: 40.0000 mg | ORAL_TABLET | Freq: Every day | ORAL | 0 refills | Status: DC

## 2024-03-31 NOTE — Progress Notes (Unsigned)
 Pt in office for labs and Dr. Chandra requested a nebulizer treatment.  Pt transferred to Dr. Chandra as provider to document.

## 2024-04-01 ENCOUNTER — Ambulatory Visit: Payer: Self-pay | Admitting: Family Medicine

## 2024-04-01 LAB — CBC WITH DIFFERENTIAL/PLATELET
Basophils Absolute: 0.1 x10E3/uL (ref 0.0–0.2)
Basos: 0 %
EOS (ABSOLUTE): 0.1 x10E3/uL (ref 0.0–0.4)
Eos: 1 %
Hematocrit: 44.2 % (ref 37.5–51.0)
Hemoglobin: 14.3 g/dL (ref 13.0–17.7)
Immature Grans (Abs): 0 x10E3/uL (ref 0.0–0.1)
Immature Granulocytes: 0 %
Lymphocytes Absolute: 1.7 x10E3/uL (ref 0.7–3.1)
Lymphs: 14 %
MCH: 29.4 pg (ref 26.6–33.0)
MCHC: 32.4 g/dL (ref 31.5–35.7)
MCV: 91 fL (ref 79–97)
Monocytes Absolute: 0.6 x10E3/uL (ref 0.1–0.9)
Monocytes: 5 %
Neutrophils Absolute: 9.6 x10E3/uL — ABNORMAL HIGH (ref 1.4–7.0)
Neutrophils: 80 %
Platelets: 249 x10E3/uL (ref 150–450)
RBC: 4.86 x10E6/uL (ref 4.14–5.80)
RDW: 12.9 % (ref 11.6–15.4)
WBC: 12.2 x10E3/uL — ABNORMAL HIGH (ref 3.4–10.8)

## 2024-04-01 LAB — COMPREHENSIVE METABOLIC PANEL WITH GFR
ALT: 24 IU/L (ref 0–44)
AST: 21 IU/L (ref 0–40)
Albumin: 4.6 g/dL (ref 4.1–5.1)
Alkaline Phosphatase: 116 IU/L (ref 47–123)
BUN/Creatinine Ratio: 13 (ref 9–20)
BUN: 17 mg/dL (ref 6–24)
Bilirubin Total: 0.5 mg/dL (ref 0.0–1.2)
CO2: 23 mmol/L (ref 20–29)
Calcium: 9.6 mg/dL (ref 8.7–10.2)
Chloride: 102 mmol/L (ref 96–106)
Creatinine, Ser: 1.29 mg/dL — ABNORMAL HIGH (ref 0.76–1.27)
Globulin, Total: 2.6 g/dL (ref 1.5–4.5)
Glucose: 89 mg/dL (ref 70–99)
Potassium: 4 mmol/L (ref 3.5–5.2)
Sodium: 142 mmol/L (ref 134–144)
Total Protein: 7.2 g/dL (ref 6.0–8.5)
eGFR: 70 mL/min/1.73 (ref >59)

## 2024-04-01 LAB — LIPID PANEL
Chol/HDL Ratio: 5 ratio (ref 0.0–5.0)
Cholesterol, Total: 211 mg/dL — ABNORMAL HIGH (ref 100–199)
HDL: 42 mg/dL (ref >39)
LDL Chol Calc (NIH): 140 mg/dL — ABNORMAL HIGH (ref 0–99)
Triglycerides: 159 mg/dL — ABNORMAL HIGH (ref 0–149)
VLDL Cholesterol Cal: 29 mg/dL (ref 5–40)

## 2024-04-01 LAB — HEMOGLOBIN A1C
Est. average glucose Bld gHb Est-mCnc: 117 mg/dL
Hgb A1c MFr Bld: 5.7 % — ABNORMAL HIGH (ref 4.8–5.6)

## 2024-04-01 NOTE — Progress Notes (Signed)
 Pt complaining of cough starting yesterday.  On exam he was coughing and had wheezing.  Nebulizer treatment was initiated.  Pt was sent in with a prescription for steroids for asthma exacerbation.

## 2024-04-03 ENCOUNTER — Other Ambulatory Visit: Payer: Self-pay | Admitting: Family Medicine

## 2024-04-03 DIAGNOSIS — J4540 Moderate persistent asthma, uncomplicated: Secondary | ICD-10-CM

## 2024-04-03 MED ORDER — ALBUTEROL SULFATE HFA 108 (90 BASE) MCG/ACT IN AERS: 2.0000 | INHALATION_SPRAY | RESPIRATORY_TRACT | 11 refills | Status: AC | PRN

## 2024-04-04 ENCOUNTER — Ambulatory Visit: Payer: Self-pay

## 2024-04-04 ENCOUNTER — Other Ambulatory Visit: Payer: Self-pay | Admitting: Family Medicine

## 2024-04-04 DIAGNOSIS — J4540 Moderate persistent asthma, uncomplicated: Secondary | ICD-10-CM

## 2024-04-04 MED ORDER — PREDNISONE 20 MG PO TABS: 40.0000 mg | ORAL_TABLET | Freq: Every day | ORAL | 0 refills | Status: AC

## 2024-04-04 MED ORDER — AZITHROMYCIN 250 MG PO TABS: ORAL_TABLET | ORAL | 0 refills | Status: AC

## 2024-04-04 MED ORDER — LEVALBUTEROL TARTRATE 45 MCG/ACT IN AERO: 2.0000 | INHALATION_SPRAY | Freq: Four times a day (QID) | RESPIRATORY_TRACT | 12 refills | Status: DC | PRN

## 2024-04-04 NOTE — Telephone Encounter (Signed)
 Triage called to notify provider that patient is currently not able to refill medicine due to it being too early.  I contacted the pharmacy and they stated that pt picked up his albuterol  and symbicort  on 03/25/24.  Earliest fill per pharmacy for albuterol  is 12/22 and for symbicort  is 12/31. Pt states he is out of his inhalers.  Please advise what next steps should be?

## 2024-04-04 NOTE — Telephone Encounter (Signed)
 FYI Only or Action Required?: Action required by provider: update on patient condition.  Patient was last seen in primary care on 03/31/2024 by Chandra Toribio POUR, MD.  Called Nurse Triage reporting Cough and Shortness of Breath.  Symptoms began a week ago.  Interventions attempted: Prescription medications: prednisone .  Symptoms are: gradually worsening.  Triage Disposition: Call PCP Now (overriding See HCP Within 4 Hours (Or PCP Triage))  Patient/caregiver understands and will follow disposition?: Yes             Copied from CRM #[id number]. Topic: Clinical - Red Word Triage >> Apr 04, 2024  8:41 AM Shawn Raymond wrote: Red Word that prompted transfer to Nurse Triage: Patient has asthma, was in the other day, finished taking his prednisone  but isn't feeling any better, has cough and difficulty breathing. Reason for Disposition  [1] MILD difficulty breathing (e.g., minimal/no SOB at rest, SOB with walking, pulse < 100) AND [2] NEW-onset or WORSE than normal  Answer Assessment - Initial Assessment Questions RN strictly advised patient to seek care at [facility] or ED if symptoms worsen or SOB/wheezing become severe to go to ED. Called [facility] and notified high priority.   1. RESPIRATORY STATUS: Describe your breathing? (e.g., wheezing, shortness of breath, unable to speak, severe coughing)      SOB, productive cough with brown and green mucous, wheezing.  2. ONSET: When did this breathing problem begin?      Saturday.  3. PATTERN Does the difficult breathing come and go, or has it been constant since it started?      Constant.  4. SEVERITY: How bad is your breathing? (e.g., mild, moderate, severe)      Patient able to speak in full sentences, no labored breathing or wheezing noted at this time. Patient does not have any of his inhalers (refills are available for both albuterol  and Symbicort  but he is unable to pick them up due to insurance states too soon to refill).  5. RECURRENT  SYMPTOM: Have you had difficulty breathing before? If Yes, ask: When was the last time? and What happened that time?      Yes.   6. CARDIAC HISTORY: Do you have any history of heart disease? (e.g., heart attack, angina, bypass surgery, angioplasty)      No.  7. LUNG HISTORY: Do you have any history of lung disease?  (e.g., pulmonary embolus, asthma, emphysema)     Asthma.  8. CAUSE: What do you think is causing the breathing problem?      Asthma and infection.  9. OTHER SYMPTOMS: Do you have any other symptoms? (e.g., chest pain, cough, dizziness, fever, runny nose)     Chest hurts when coughing. No fever or additional symptoms per patient.  10. O2 SATURATION MONITOR:  Do you use an oxygen  saturation monitor (pulse oximeter) at home? If Yes, ask: What is your reading (oxygen  level) today? What is your usual oxygen  saturation reading? (e.g., 95%)       No.  11. PREGNANCY: Is there any chance you are pregnant? When was your last menstrual period?       N/A.  12. TRAVEL: Have you traveled out of the country in the last month? (e.g., travel history, exposures)       Did not assess.  Protocols used: Breathing Difficulty-A-AH

## 2024-04-07 ENCOUNTER — Ambulatory Visit: Admitting: Family Medicine

## 2024-04-07 VITALS — BP 125/77 | HR 70 | Ht 66.5 in | Wt 195.0 lb

## 2024-04-07 DIAGNOSIS — R12 Heartburn: Secondary | ICD-10-CM

## 2024-04-07 DIAGNOSIS — J4540 Moderate persistent asthma, uncomplicated: Secondary | ICD-10-CM

## 2024-04-07 DIAGNOSIS — Z0000 Encounter for general adult medical examination without abnormal findings: Secondary | ICD-10-CM | POA: Insufficient documentation

## 2024-04-07 MED ORDER — OMEPRAZOLE 20 MG PO CPDR: 20.0000 mg | DELAYED_RELEASE_CAPSULE | Freq: Every day | ORAL | 3 refills | Status: DC

## 2024-04-07 MED ORDER — BUDESONIDE-FORMOTEROL FUMARATE 160-4.5 MCG/ACT IN AERO: 2.0000 | INHALATION_SPRAY | Freq: Two times a day (BID) | RESPIRATORY_TRACT | 0 refills | Status: AC

## 2024-04-07 NOTE — Progress Notes (Unsigned)
 "  Established Patient Office Visit  Subjective   Patient ID: Shawn Raymond, male    DOB: 11-10-1980  Age: 43 y.o. MRN: [id number]  Chief Complaint  Patient presents with   Medical Management of Chronic Issues    Discussed the use of AI scribe software for clinical note transcription with the patient, who gave verbal consent to proceed.  History of Present Illness   Shawn Raymond is a 43 year old male who presents with respiratory symptoms and medication management.  He has experienced significant improvement in respiratory symptoms after starting a new medication regimen, which includes prednisone , azithromycin , and Xopenex  inhaler. Previously, he had persistent coughing but notes feeling much better now. He prefers Xopenex  over albuterol  for his rescue inhaler needs. He uses Symbicort  for his respiratory condition but has run out and is unsure when he can refill it. He also uses Ventolin  and has been using his inhalers more frequently during respiratory exacerbations. He picked up both inhalers on December 9th.  He has a history of elevated cholesterol, which improved in June but returned to baseline levels. He was previously on atorvastatin  about four to five years ago but is not currently taking any statin medications. His blood sugar levels have remained stable, with a recent reading of 5.7, similar to last year's 5.8. He is not on any blood pressure or cholesterol medications and tries to manage his health through diet and yoga.  His white blood cell count was elevated. His kidney function has been stable over the past two to three years, with a creatinine level of 1.3. He mentions a family history of kidney disease, as his father lost a kidney and was exposed to contaminated water  at [facility]. He also lived there in 1998.  He consumes about four Pepsi's a day and acknowledges the need to reduce his intake, especially considering his father's kidney disease. He practices  yoga as part of his health management.  He experiences back pain, for which he occasionally takes tramadol , especially on days with increased activity or travel. He received a prescription for tramadol  on December 15th and uses it as needed.  He takes omeprazole  once daily for acid reflux, which is well-controlled when he takes the medication. However, he experiences significant reflux symptoms if he misses a dose, as he did during a recent trip to [facility].          The 10-year ASCVD risk score (Arnett DK, et al., 2019) is: 8.6%  Health Maintenance Due  Topic Date Due   Hepatitis C Screening  Never done   Pneumococcal Vaccine (1 of 2 - PCV) Never done   Hepatitis B Vaccines 19-59 Average Risk (1 of 3 - 19+ 3-dose series) Never done   HPV VACCINES (1 - 3-dose SCDM series) Never done   COVID-19 Vaccine (1 - 2025-26 season) Never done      Objective:     BP (!) 143/89   Pulse 70   Ht 5' 6.5 (1.689 m)   Wt 195 lb (88.5 kg)   SpO2 99%   BMI 31.00 kg/m  {Vitals History (Optional):23777}  Physical Exam     Gen: alert, oriented HEENT: perrla, eomi, mmm CV: rrr, no murmur Pulm: lctab. No wheeze or crackles.  GI: soft, nbs.  Nontender to palpation MSK: strength equal b/l. Normal gait Ext: no pedal edema Skin: warm and dry, no rashes Psych: pleasant affect.  Spontaneous speech      No results found for any  visits on 04/07/24.      Assessment & Plan:   Physical exam, annual  Moderate persistent asthma without complication Assessment & Plan: exacerbation improved. Transitioned to Xopenex  for rescue. Symbicort  prescription expired. - Continue Xopenex  as rescue inhaler. - Prescribed Symbicort  with early fill request.   Heartburn  Healthcare maintenance Assessment & Plan: Pt declined flu and pneumococcal vaccinations   Other orders -     Omeprazole ; Take 1 capsule (20 mg total) by mouth daily.  Dispense: 30 capsule; Refill: 3 -     Budesonide -Formoterol   Fumarate; Inhale 2 puffs into the lungs 2 (two) times daily.  Dispense: 1 each; Refill: 0             Return in about 6 months (around 10/06/2024) for bp, pain.    Toribio MARLA Slain, MD  . "

## 2024-04-07 NOTE — Patient Instructions (Signed)
" °  VISIT SUMMARY: During your visit, we discussed your respiratory symptoms, medication management, and overall health. You reported significant improvement in your respiratory symptoms with your current medication regimen. We also reviewed your cholesterol levels, kidney function, and lifestyle habits. Additionally, we addressed your back pain and acid reflux management.  YOUR PLAN: -CHRONIC OBSTRUCTIVE PULMONARY DISEASE (COPD): COPD is a chronic lung condition that makes it hard to breathe. Your COPD exacerbation has improved, and you will continue using Xopenex  as your rescue inhaler. A new prescription for Symbicort  has been provided with an early fill request.  -LOW BACK PAIN: Your chronic low back pain is being managed with tramadol  as needed. Continue to use tramadol  when necessary, especially during increased activity or travel.  -GASTROESOPHAGEAL REFLUX DISEASE (GERD): GERD is a condition where stomach acid frequently flows back into the tube connecting your mouth and stomach. Your GERD is well-controlled with omeprazole . We have discussed reducing your dose to 20 mg daily. Monitor your symptoms and adjust the dose if necessary.   -HYPERTENSION: Hypertension is high blood pressure. Your blood pressure was elevated during the visit. We rechecked it to monitor your condition.  -GENERAL HEALTH MAINTENANCE: We discussed the importance of keeping your vaccinations up to date, including flu and pneumococcal vaccines.  INSTRUCTIONS: Please ensure your flu and pneumococcal vaccines are up to date. Follow up with us  if you experience any changes in your symptoms or have any concerns about your medications.  Please take the 20mg  omeprazole  daily instead of the 40mg .  If you tolerate this dose we will continue it at 20mg .     "

## 2024-04-07 NOTE — Assessment & Plan Note (Signed)
 exacerbation improved. Transitioned to Xopenex  for rescue. Symbicort  prescription expired. - Continue Xopenex  as rescue inhaler. - Prescribed Symbicort  with early fill request.

## 2024-04-07 NOTE — Assessment & Plan Note (Signed)
 Pt declined flu and pneumococcal vaccinations

## 2024-04-08 NOTE — Assessment & Plan Note (Signed)
 GERD controlled with omeprazole  40 mg. Discussed dose reduction to 20 mg. - Prescribed omeprazole  20 mg daily. - Monitor symptoms and adjust dose if necessary.

## 2024-04-15 ENCOUNTER — Ambulatory Visit: Admitting: Orthopedic Surgery

## 2024-04-22 ENCOUNTER — Other Ambulatory Visit: Payer: Self-pay | Admitting: Family Medicine

## 2024-04-22 MED ORDER — TRIAMCINOLONE ACETONIDE 0.5 % EX OINT: 1.0000 | TOPICAL_OINTMENT | Freq: Two times a day (BID) | CUTANEOUS | 5 refills | Status: DC

## 2024-04-22 MED ORDER — TRAMADOL HCL 50 MG PO TABS: 50.0000 mg | ORAL_TABLET | Freq: Three times a day (TID) | ORAL | 0 refills | Status: AC | PRN

## 2024-05-06 NOTE — Telephone Encounter (Signed)
 Pt is scheduled

## 2024-05-07 ENCOUNTER — Other Ambulatory Visit: Payer: Self-pay

## 2024-05-07 MED ORDER — OMEPRAZOLE 20 MG PO CPDR: 20.0000 mg | DELAYED_RELEASE_CAPSULE | Freq: Every day | ORAL | 3 refills | Status: AC

## 2024-05-08 ENCOUNTER — Ambulatory Visit (INDEPENDENT_AMBULATORY_CARE_PROVIDER_SITE_OTHER): Admitting: Family Medicine

## 2024-05-08 ENCOUNTER — Encounter: Payer: Self-pay | Admitting: Family Medicine

## 2024-05-08 VITALS — BP 127/77 | HR 60 | Ht 66.5 in | Wt 191.0 lb

## 2024-05-08 DIAGNOSIS — L2082 Flexural eczema: Secondary | ICD-10-CM

## 2024-05-08 DIAGNOSIS — A630 Anogenital (venereal) warts: Secondary | ICD-10-CM | POA: Insufficient documentation

## 2024-05-08 MED ORDER — BETAMETHASONE DIPROPIONATE 0.05 % EX CREA: TOPICAL_CREAM | Freq: Two times a day (BID) | CUTANEOUS | 2 refills | Status: DC

## 2024-05-08 MED ORDER — IMIQUIMOD 3.75 % EX CREA: TOPICAL_CREAM | CUTANEOUS | 2 refills | Status: DC

## 2024-05-08 MED ORDER — UREA 20 % EX CREA: TOPICAL_CREAM | CUTANEOUS | 2 refills | Status: AC

## 2024-05-08 MED ORDER — LEVALBUTEROL TARTRATE 45 MCG/ACT IN AERO: 2.0000 | INHALATION_SPRAY | Freq: Four times a day (QID) | RESPIRATORY_TRACT | 12 refills | Status: AC | PRN

## 2024-05-08 NOTE — Progress Notes (Signed)
" ° ° °  Subjective   Patient ID: Shawn Raymond, male    DOB: 1981-02-13  Age: 44 y.o. MRN: [id number]  Chief Complaint  Patient presents with   Genital Warts     History of Present Illness   Shawn Raymond is a 44 year old male who presents with a suspected genital wart and a flare-up of hand eczema.  He noticed a spot on his genital area about three months ago that began as three small dots and has grown into a skin tag-like lesion with adjacent similar lesions near the base of his glans. There is no pain or discharge.  He has a sudden flare of intensely itchy eczema on his palms that has worsened over the past few days. Triamcinolone  cream used several times daily, which previously controlled symptoms, is now less effective. He wears nitrile gloves while feeding his dog a raw diet.  He has not yet obtained the prescribed Xopenex  inhaler.       The 10-year ASCVD risk score (Arnett DK, et al., 2019) is: 7%  Health Maintenance Due  Topic Date Due   Hepatitis C Screening  Never done   Pneumococcal Vaccine (1 of 2 - PCV) Never done   Hepatitis B Vaccines 19-59 Average Risk (1 of 3 - 19+ 3-dose series) Never done   COVID-19 Vaccine (1 - 2025-26 season) Never done      Objective:     BP 127/77   Pulse 60   Ht 5' 6.5 (1.689 m)   Wt 191 lb (86.6 kg)   SpO2 99%   BMI 30.37 kg/m    Physical Exam   Gen: alert, oriented Pulm: no respiratory distress Skin: hyperpigmentatoin, peeling skin on the palms b/l GENITOURINARY: 3 small papular lesions on the shaft of the penis below the base of the glans        No results found for any visits on 05/08/24.      Assessment & Plan:   Genital warts Assessment & Plan: Three small penile lesions resembling skin tags,  - Prescribed Aldara  cream nightly for eight weeks. - If no improvement, continue Aldara  for another eight weeks. - Refer to urologist or dermatologist if lesions persist after 16 weeks.   Flexural  eczema Assessment & Plan: Chronic eczema with recent flare-up, itching, and thickened skin on palms. Psoriasis considered but less likely. - Prescribed stronger steroid cream for palms. - Recommended OTC urea  cream to enhance steroid absorption. - Refer to dermatologist if symptoms persist after two weeks.   Other orders -     Levalbuterol  Tartrate; Inhale 2 puffs into the lungs every 6 (six) hours as needed for wheezing.  Dispense: 1 each; Refill: 12 -     Betamethasone  Dipropionate; Apply topically 2 (two) times daily. To palms  Dispense: 30 g; Refill: 2 -     Urea ; Apply twice daily to palms until fully absorbed. Then apply steroid cream  Dispense: 85 g; Refill: 2 -     Imiquimod ; Apply small amount (0.25g) topically to wart nightly for 8 weeks  Dispense: 7.5 g; Refill: 2              Return if symptoms worsen or fail to improve.    Toribio MARLA Slain, MD  "

## 2024-05-08 NOTE — Assessment & Plan Note (Signed)
 Three small penile lesions resembling skin tags,  - Prescribed Aldara  cream nightly for eight weeks. - If no improvement, continue Aldara  for another eight weeks. - Refer to urologist or dermatologist if lesions persist after 16 weeks.

## 2024-05-08 NOTE — Patient Instructions (Signed)
 It was nice to see you today,  We addressed the following topics today: - I have sent in the urea  cream and a new topical steroid.  - apply the urea  cream first and rub in until fully absorbed.  Then apply the topical steroid.  Do this 2 times per day - apply the imiquimod  to the genital lesion nightly for 8 weeks.  If additional time is needed let us  know.  - I have sent in your xopenex .   Have a great day,  Rolan Slain, MD

## 2024-05-08 NOTE — Assessment & Plan Note (Signed)
 Chronic eczema with recent flare-up, itching, and thickened skin on palms. Psoriasis considered but less likely. - Prescribed stronger steroid cream for palms. - Recommended OTC urea  cream to enhance steroid absorption. - Refer to dermatologist if symptoms persist after two weeks.

## 2024-05-09 ENCOUNTER — Telehealth: Payer: Self-pay

## 2024-05-09 ENCOUNTER — Other Ambulatory Visit (HOSPITAL_COMMUNITY): Payer: Self-pay

## 2024-05-09 NOTE — Telephone Encounter (Signed)
 Pharmacy Patient Advocate Encounter   Received notification from [facility] that prior authorization for levalbuterol  (XOPENEX  HFA) 45 MCG/ACT inhaler  is required/requested.   Insurance verification completed.   The patient is insured through [facility] MEDICAID.   Per test claim: PA required; PA submitted to above mentioned insurance via Latent Key/confirmation #/EOC [id number] Status is pending

## 2024-05-09 NOTE — Telephone Encounter (Signed)
 Pharmacy Patient Advocate Encounter   Received notification from [facility] that prior authorization for Imiquimod  3.75 % CREA  is required/requested.   Insurance verification completed.   The patient is insured through [facility] MEDICAID.   Per test claim: PA required; PA submitted to above mentioned insurance via Latent Key/confirmation #/EOC [facility] Status is pending

## 2024-05-09 NOTE — Telephone Encounter (Signed)
 Pharmacy Patient Advocate Encounter   Received notification from [facility] that prior authorization for betamethasone  dipropionate 0.05 % cream [[id number]]  is required/requested.   Insurance verification completed.   The patient is insured through [facility] MEDICAID.   Per test claim: PA required; PA submitted to above mentioned insurance via Latent Key/confirmation #/EOC [id number] Status is pending

## 2024-05-12 ENCOUNTER — Other Ambulatory Visit (HOSPITAL_COMMUNITY): Payer: Self-pay

## 2024-05-14 NOTE — Telephone Encounter (Signed)
 Pharmacy Patient Advocate Encounter  Received notification from [facility] MEDICAID that Prior Authorization for Betamethasone  Dipropionate 0.05% cream   has been DENIED.  See denial reason below. No denial letter attached in CMM. Will attach denial letter to Media tab once received. Requires trial of at least 2 preferred alternatives: betamethasone  valerate cream/ointment, fluocinonide cream/gel/ointment, triamcinolone  acetonide cream/lotion/ointment. Member has only tried one (triamcinolone  acetonide ointment).   PA #/Case ID/Reference #: [id number]

## 2024-05-14 NOTE — Telephone Encounter (Signed)
 Pharmacy Patient Advocate Encounter  Received notification from [facility] MEDICAID that Prior Authorization for {3.75% cream  has been DENIED.  See denial reason below. No denial letter attached in CMM. Will attach denial letter to Media tab once received.  Requires step therapy with preferred medication: imiquimod  5% cream packet before approving imiquimod  3.75% cream.   PA #/Case ID/Reference #: [id number]

## 2024-05-14 NOTE — Telephone Encounter (Signed)
 Pharmacy Patient Advocate Encounter  Received notification from [facility] MEDICAID that Prior Authorization for Levalbuterol  Tartrate 45MCG/ACT aerosol  has been APPROVED from 05/13/2024 to 04/16/2025   PA #/Case ID/Reference #: [id number]

## 2024-05-16 ENCOUNTER — Other Ambulatory Visit: Payer: Self-pay | Admitting: Family Medicine

## 2024-05-16 MED ORDER — IMIQUIMOD 5 % EX CREA: TOPICAL_CREAM | CUTANEOUS | 3 refills | Status: AC

## 2024-05-16 MED ORDER — FLUOCINONIDE 0.05 % EX CREA: 1.0000 | TOPICAL_CREAM | Freq: Two times a day (BID) | CUTANEOUS | 1 refills | Status: AC

## 2024-05-16 NOTE — Telephone Encounter (Signed)
 Please let the patient know that I have sent in different prescriptions for the topical medications I prescribed due to insurance coverage issues.  There are no changes to the directions on the cream he puts on his hands, but the cream for his genitals will be 3 times a week instead of every night.

## 2024-05-16 NOTE — Telephone Encounter (Signed)
 LVM for pt to call office to inform him of below.  Will also send a mychart message with this information.

## 2024-05-21 ENCOUNTER — Other Ambulatory Visit: Payer: Self-pay | Admitting: Family Medicine

## 2024-06-10 ENCOUNTER — Ambulatory Visit: Admitting: Orthopedic Surgery

## 2024-10-08 ENCOUNTER — Ambulatory Visit: Admitting: Family Medicine
